# Patient Record
Sex: Female | Born: 1980 | Race: Black or African American | Hispanic: No | Marital: Single | Smoking: Current every day smoker
Health system: Southern US, Community
[De-identification: ages and names within clinical notes are randomized; demographics above are authoritative.]

## PROBLEM LIST (undated history)

## (undated) DIAGNOSIS — G43909 Migraine, unspecified, not intractable, without status migrainosus: Secondary | ICD-10-CM

## (undated) DIAGNOSIS — M329 Systemic lupus erythematosus, unspecified: Secondary | ICD-10-CM

## (undated) DIAGNOSIS — IMO0002 Reserved for concepts with insufficient information to code with codable children: Secondary | ICD-10-CM

## (undated) DIAGNOSIS — M2550 Pain in unspecified joint: Secondary | ICD-10-CM

## (undated) DIAGNOSIS — F419 Anxiety disorder, unspecified: Secondary | ICD-10-CM

## (undated) DIAGNOSIS — E559 Vitamin D deficiency, unspecified: Secondary | ICD-10-CM

## (undated) DIAGNOSIS — M549 Dorsalgia, unspecified: Secondary | ICD-10-CM

## (undated) DIAGNOSIS — L309 Dermatitis, unspecified: Secondary | ICD-10-CM

## (undated) DIAGNOSIS — D649 Anemia, unspecified: Secondary | ICD-10-CM

## (undated) DIAGNOSIS — M1990 Unspecified osteoarthritis, unspecified site: Secondary | ICD-10-CM

## (undated) DIAGNOSIS — Z8739 Personal history of other diseases of the musculoskeletal system and connective tissue: Secondary | ICD-10-CM

## (undated) DIAGNOSIS — I10 Essential (primary) hypertension: Secondary | ICD-10-CM

## (undated) DIAGNOSIS — K219 Gastro-esophageal reflux disease without esophagitis: Secondary | ICD-10-CM

## (undated) DIAGNOSIS — R32 Unspecified urinary incontinence: Secondary | ICD-10-CM

## (undated) DIAGNOSIS — G5600 Carpal tunnel syndrome, unspecified upper limb: Secondary | ICD-10-CM

## (undated) DIAGNOSIS — M797 Fibromyalgia: Secondary | ICD-10-CM

## (undated) DIAGNOSIS — M109 Gout, unspecified: Secondary | ICD-10-CM

## (undated) DIAGNOSIS — E785 Hyperlipidemia, unspecified: Secondary | ICD-10-CM

## (undated) DIAGNOSIS — N3281 Overactive bladder: Secondary | ICD-10-CM

## (undated) DIAGNOSIS — F32A Depression, unspecified: Secondary | ICD-10-CM

## (undated) DIAGNOSIS — J45909 Unspecified asthma, uncomplicated: Secondary | ICD-10-CM

## (undated) DIAGNOSIS — J189 Pneumonia, unspecified organism: Secondary | ICD-10-CM

## (undated) HISTORY — DX: Vitamin D deficiency, unspecified: E55.9

## (undated) HISTORY — DX: Unspecified osteoarthritis, unspecified site: M19.90

## (undated) HISTORY — DX: Reserved for concepts with insufficient information to code with codable children: IMO0002

## (undated) HISTORY — DX: Pain in unspecified joint: M25.50

## (undated) HISTORY — DX: Fibromyalgia: M79.7

## (undated) HISTORY — DX: Dorsalgia, unspecified: M54.9

## (undated) HISTORY — DX: Personal history of other diseases of the musculoskeletal system and connective tissue: Z87.39

## (undated) HISTORY — DX: Overactive bladder: N32.81

## (undated) HISTORY — DX: Gout, unspecified: M10.9

## (undated) HISTORY — DX: Migraine, unspecified, not intractable, without status migrainosus: G43.909

## (undated) HISTORY — DX: Dermatitis, unspecified: L30.9

## (undated) HISTORY — PX: NEUROMA SURGERY: SHX722

## (undated) HISTORY — DX: Hyperlipidemia, unspecified: E78.5

## (undated) HISTORY — PX: LEG SURGERY: SHX1003

## (undated) HISTORY — PX: ANKLE SURGERY: SHX546

## (undated) HISTORY — DX: Systemic lupus erythematosus, unspecified: M32.9

## (undated) HISTORY — DX: Unspecified urinary incontinence: R32

---

## 2014-12-20 ENCOUNTER — Encounter (HOSPITAL_COMMUNITY): Payer: Self-pay | Admitting: *Deleted

## 2014-12-20 ENCOUNTER — Emergency Department (HOSPITAL_COMMUNITY)
Admission: EM | Admit: 2014-12-20 | Discharge: 2014-12-20 | Disposition: A | Discharge status: Home / Self Care | Payer: Medicaid Other | Attending: Emergency Medicine | Admitting: Emergency Medicine

## 2014-12-20 DIAGNOSIS — Z720 Tobacco use: Secondary | ICD-10-CM | POA: Diagnosis not present

## 2014-12-20 DIAGNOSIS — N390 Urinary tract infection, site not specified: Secondary | ICD-10-CM | POA: Insufficient documentation

## 2014-12-20 DIAGNOSIS — Z8701 Personal history of pneumonia (recurrent): Secondary | ICD-10-CM | POA: Insufficient documentation

## 2014-12-20 DIAGNOSIS — M79641 Pain in right hand: Secondary | ICD-10-CM | POA: Diagnosis present

## 2014-12-20 DIAGNOSIS — G5603 Carpal tunnel syndrome, bilateral upper limbs: Secondary | ICD-10-CM | POA: Insufficient documentation

## 2014-12-20 DIAGNOSIS — J45909 Unspecified asthma, uncomplicated: Secondary | ICD-10-CM | POA: Insufficient documentation

## 2014-12-20 DIAGNOSIS — Z880 Allergy status to penicillin: Secondary | ICD-10-CM | POA: Diagnosis not present

## 2014-12-20 DIAGNOSIS — Z3202 Encounter for pregnancy test, result negative: Secondary | ICD-10-CM | POA: Insufficient documentation

## 2014-12-20 HISTORY — DX: Unspecified asthma, uncomplicated: J45.909

## 2014-12-20 HISTORY — DX: Gastro-esophageal reflux disease without esophagitis: K21.9

## 2014-12-20 HISTORY — DX: Carpal tunnel syndrome, unspecified upper limb: G56.00

## 2014-12-20 LAB — URINALYSIS, ROUTINE W REFLEX MICROSCOPIC
BILIRUBIN URINE: NEGATIVE
GLUCOSE, UA: NEGATIVE mg/dL
KETONES UR: NEGATIVE mg/dL
Nitrite: NEGATIVE
PH: 6.5 (ref 5.0–8.0)
Protein, ur: 100 mg/dL — AB
Specific Gravity, Urine: 1.025 (ref 1.005–1.030)
Urobilinogen, UA: 0.2 mg/dL (ref 0.0–1.0)

## 2014-12-20 LAB — POC URINE PREG, ED: Preg Test, Ur: NEGATIVE

## 2014-12-20 LAB — URINE MICROSCOPIC-ADD ON

## 2014-12-20 MED ORDER — IBUPROFEN 800 MG PO TABS: 800.0000 mg | ORAL_TABLET | Freq: Three times a day (TID) | ORAL | Status: DC

## 2014-12-20 MED ORDER — NITROFURANTOIN MONOHYD MACRO 100 MG PO CAPS: 100.0000 mg | ORAL_CAPSULE | Freq: Two times a day (BID) | ORAL | Status: DC

## 2014-12-20 NOTE — ED Notes (Signed)
Patient brought back from triage, refuses to get in gown.

## 2014-12-20 NOTE — ED Notes (Signed)
Pt is here with uti:  Odor, cloudy urine, wipes has some blood tinges, pain with urination.  No vaginal discharge or bleeding.  Does not get menstrual because she gets DEPO.  Pt has carpal tunnel and states she gets hand cramps bilaterally and radiates up arms

## 2014-12-20 NOTE — ED Notes (Signed)
Acuity changed per request of PA in fast trac

## 2014-12-20 NOTE — Discharge Instructions (Signed)
1. Medications: antibiotic, ibuprofen, usual home medications 2. Treatment: rest, drink plenty of fluids  3. Follow Up: please followup with your primary doctor in 1 week for discussion of your diagnoses and further evaluation after today's visit; please follow-up with hand surgeon with no improvement in carpal tunnel symptoms; if you do not have a primary care doctor use the resource guide provided to find one; please return to the ER for fever, abdominal pain, back pain, persistent vomiting, new or worsening symptoms   Carpal Tunnel Syndrome The carpal tunnel is an area under the skin of the palm of your hand. Nerves, blood vessels, and strong tissues (tendons) pass through the tunnel. The tunnel can become puffy (swollen). If this happens, a nerve can be pinched in the wrist. This causes carpal tunnel syndrome.  HOME CARE  Take all medicine as told by your doctor.  If you were given a splint, wear it as told. Wear it at night or at times when your doctor told you to.  Rest your wrist from the activity that causes your pain.  Put ice on your wrist after long periods of wrist activity.  Put ice in a plastic bag.  Place a towel between your skin and the bag.  Leave the ice on for 15-20 minutes, 03-04 times a day.  Keep all doctor visits as told. GET HELP RIGHT AWAY IF:  You have new problems you cannot explain.  Your problems get worse and medicine does not help. MAKE SURE YOU:   Understand these instructions.  Will watch your condition.  Will get help right away if you are not doing well or get worse. Document Released: 02/22/2011 Document Revised: 05/28/2011 Document Reviewed: 02/22/2011 [facility] Patient Information 2015 [place], [place]. This information is not intended to replace advice given to you by your health care provider. Make sure you discuss any questions you have with your health care provider.  Urinary Tract Infection A urinary tract infection (UTI) can occur any  place along the urinary tract. The tract includes the kidneys, ureters, bladder, and urethra. A type of germ called bacteria often causes a UTI. UTIs are often helped with antibiotic medicine.  HOME CARE   If given, take antibiotics as told by your doctor. Finish them even if you start to feel better.  Drink enough fluids to keep your pee (urine) clear or pale yellow.  Avoid tea, drinks with caffeine, and bubbly (carbonated) drinks.  Pee often. Avoid holding your pee in for a long time.  Pee before and after having sex (intercourse).  Wipe from front to back after you poop (bowel movement) if you are a woman. Use each tissue only once. GET HELP RIGHT AWAY IF:   You have back pain.  You have lower belly (abdominal) pain.  You have chills.  You feel sick to your stomach (nauseous).  You throw up (vomit).  Your burning or discomfort with peeing does not go away.  You have a fever.  Your symptoms are not better in 3 days. MAKE SURE YOU:   Understand these instructions.  Will watch your condition.  Will get help right away if you are not doing well or get worse. Document Released: 08/22/2007 Document Revised: 11/28/2011 Document Reviewed: 10/04/2011 [facility] Patient Information 2015 [place], [place]. This information is not intended to replace advice given to you by your health care provider. Make sure you discuss any questions you have with your health care provider.   Emergency Department Resource Guide 1) Find a Doctor  and Pay Out of Pocket Although you won't have to find out who is covered by your insurance plan, it is a good idea to ask around and get recommendations. You will then need to call the office and see if the doctor you have chosen will accept you as a new patient and what types of options they offer for patients who are self-pay. Some doctors offer discounts or will set up payment plans for their patients who do not have insurance, but you will need to ask so  you aren't surprised when you get to your appointment.  2) Contact Your Local Health Department Not all health departments have doctors that can see patients for sick visits, but many do, so it is worth a call to see if yours does. If you don't know where your local health department is, you can check in your phone book. The CDC also has a tool to help you locate your state's health department, and many state websites also have listings of all of their local health departments.  3) Find a Walk-in Clinic If your illness is not likely to be very severe or complicated, you may want to try a walk in clinic. These are popping up all over the country in pharmacies, drugstores, and shopping centers. They're usually staffed by nurse practitioners or physician assistants that have been trained to treat common illnesses and complaints. They're usually fairly quick and inexpensive. However, if you have serious medical issues or chronic medical problems, these are probably not your best option.  No Primary Care Doctor: - Call Health Connect at  [phone] - they can help you locate a primary care doctor that  accepts your insurance, provides certain services, etc. - Physician Referral Service- [phone]  Chronic Pain Problems: Organization         Address  Phone   Notes  [facility]  [phone] Patients need to be referred by their primary care doctor.   Medication Assistance: Organization         Address  Phone   Notes  [facility] Medication [facility] [address] [place] [place], [place] [address] [phone] --Must be a resident of [facility] -- Must have NO insurance coverage whatsoever (no Medicaid/ Medicare, etc.) -- The pt. MUST have a primary care doctor that directs their care regularly and follows them in the community   MedAssist  [phone]   [employer]  [phone]    Agencies that provide inexpensive medical  care: Organization         Address  Phone   Notes  [facility]  [phone]   [facility]    [phone]   [facility] [address] [place], [place] [address] [phone]   [facility] [address] [place], [place] [phone]   Planned Parenthood    [phone]   [facility]    [phone]   [facility]  [address], [place] Phone:  [phone], Fax:  [phone] Hours of Operation:  9 am - 6 pm, M-F.  Also accepts Medicaid/Medicare and self-pay.  [facility]  [address], [place] Phone: [phone], Fax: [phone]. Hours of Operation:  8:30 am - 5:30 pm, M-F.  Also accepts Medicaid and self-pay.  [facility] [place] [address] [employer] Phone: [phone]  [facility] [address], [place], [place] [phone], Ext. [phone] Mondays & Thursdays: 7-9 AM.  First 15 patients are seen on a first come, first serve basis.    Medicaid-accepting [facility] Providers:  Organization         Address  Phone   Notes  [facility] [address], [place] [phone]) [phone] Also accepts self-pay patients.  [facility] [address], [place]  [phone]) [phone]   [facility] [address], [place] [phone]) [phone]   [facility] [address] [place] [address], [place] [phone](336) [phone]   Renaye RakersVeita Bland [address], [place]   [phone]) [phone] Only accepts [place] Access [place] patients after they have their name applied to their card.   Self-Pay (no insurance) in [facility]:  Organization         Address  Phone   Notes  Sickle Cell Patients, [facility] [address], [place] [phone](336) [phone]   [facility] [address],  [place] [phone]) [phone]   [facility]  [address],  [phone]) [phone]   [facility]/Dr. Osei-Bonsu  [address] [place] [address], [place] or [phone], [place] [phone]) [phone] Phone number for both [place] and [place] locations is the same.  [facility] [address], [place] [phone](336) [phone]   [facility] [address] [place] [address], [place] or [address] [phone](336) [phone]   [facility] [address], [place] [phone](336) [phone], phone; [phone](336) [phone], fax Sees patients 1st and 3rd Saturday of every month.  Must not qualify for public or private insurance (i.e. Medicaid, Medicare, [place] Health Choice, Veterans' Benefits)  Household income should be no more than 200% of the poverty level The clinic cannot treat you if you are pregnant or think you are pregnant  Sexually transmitted diseases are not treated at the clinic.    Dental Care: Organization         Address  Phone  Notes  [facility] of [facility] [address], [place] [phone]) [phone] Accepts children up to age 421 who are enrolled in [place]icaid or [place] Health Choice; pregnant women with a Medicaid card; and children who have applied for Medicaid or [place] Health Choice, but were declined, whose parents can pay a reduced fee at time of service.  [facility] of [facility] [place]  [address], [place] [phone]) [phone] Accepts children up to age 36 who are enrolled in [place]icaid or [place] Health Choice; pregnant women with a Medicaid card; and children who have applied for Medicaid or [place] Health Choice, but were declined, whose parents can pay a reduced fee at time of service.  [facility]  [address], [place] [phone]) [phone] Patients are seen by appointment only. Walk-ins are not accepted. [facility] will see patients 36 years of age and older. Monday - Tuesday (8am-5pm) Most Wednesdays (8:30-5pm) $30 per visit, cash only  [facility]  [address], [facility] [phone] Patients are seen by appointment only. Walk-ins are not accepted. [facility] will see patients 218 years of age and older. One Wednesday Evening (Monthly: Volunteer Based).  $30 per visit,  cash only  [employer] of Dentistry Clinics  [phone] for adults; Children under age 75, call Graduate Pediatric Dentistry at [phone]. Children aged 54-14, please call [phone] to request a pediatric application.  Dental services are provided in all areas of dental care including fillings, crowns and bridges, complete and partial dentures, implants, gum treatment, root canals, and extractions. Preventive care is also provided. Treatment is provided to both adults and children. Patients are selected via a lottery and there is often a waiting list.   [facility] [address] [place]  [phone] [email or web address]   [facility] [address] [place], [place] [phone], Ext. [phone] Second and Fourth Thursday of each month, opens at 6:30 AM; Clinic ends at 9 AM.  Patients are seen on a first-come first-served basis, and a limited number are seen during each clinic.   [facility]  [address] Ether [place]ins [place], [place] [phone]   Eligibility Requirements You must have lived in [place], [place], or [place] counties for at least the last three months.   You cannot be eligible for state or federal sponsored [place], including [employer], [place], or [employer].   You generally cannot be eligible for healthcare insurance through your employer.    How to apply: Eligibility screenings are held every Tuesday and Wednesday afternoon from 1:00 pm until 4:00 pm. You do not need an appointment for the interview!   [facility] [address] [place], [place] [phone]   [facility]  [phone]   [facility]  [phone]   [facility] Health Department  [phone]    Behavioral Health Resources in the Community: Intensive Outpatient Programs Organization         Address  Phone  Notes  [facility] [address] [place], [place] [phone]   [facility]patient [address] [place], [place] [phone]   [facility]: [facility] Drug [facility] [address], [place], [place]  [phone]   [facility] [address]  [employer], [place] [phone] or [phone]   Substance Abuse Resources Organization         Address  Phone  Notes  [facility] Drug [facility]  5413450710   [facility]  [phone]   [facility]  [phone]   Floydene Flock  [phone]   Residential & Outpatient Substance Abuse Program  [phone]   Psychological Services Organization         Address  Phone  Notes  [facility]  [phone]   [facility]  [phone]   [facility] Mental Health [address], [place] [phone] or [phone]    Mobile Crisis Teams Organization         Address  Phone  Notes  [facility], Mobile Crisis Care Unit  [phone]   [facility]  [address] [place], [place] [phone]   Doristine Locks [address] [place] [phone]    Self-Help/Support Groups Organization         Address  Phone             Notes  [facility] [place] - variety of support groups  336- I7437963 Call for more information  Narcotics Anonymous (NA), [facility] [address], [employer] [place]  2 meetings at this location   Statistician  Address  Phone  Notes  [facility] [address],    [place]  [phone]   [facility]  [address], [place] [phone], [place], [place] [phone]   [facility] [address], [place] [phone] Admissions: 8am-3pm M-F  [facility] [address]    [place], [place] [phone]   [facility] [address] [place] [address], [place], [place] [phone]   [facility] [address]  [place], [place] [phone]   [facility] - Intensive Outpatient [address], [place], [place] [phone]   [facility] ([facility] [address]  [place], [place] [phone] or [phone]   [facility] (RT[facility]) [address] [place], [place] [phone] Accepts Medicaid  [facility] [address]  [place] [phone] Substance Abuse/Addiction Treatment   [facility] Behavioral Health Resources Organization         Address  Phone  Notes  [facility]  [phone]) [phone]   Angie FavaJulie Brannon, PhD [address], [address] [place], [place]   [phone](336) [phone] or [phone]) [phone]   [facility]   [address] [place], [place] [phone](336) [phone]   [facility] [address], [place], [place] [phone](336) [phone] Insurance/Medicaid/sponsorship through [facility]  Faith and Families [address]                                    [place], [place] [phone]) [phone] Therapy/tele-psych/case  [facility] [address]   [place], [place] [phone]) [phone]    Dr. Lolly MustacheArfeen  [phone](336) [phone]   [facility] [place]  [employer] [facility] 1) [address], [place] 2) [address], [place] 3)  [address], [place] [phone](336) [phone]) [phone]  [phone](336) [phone]   [facility] Child Abuse Hotline [phone](336) 2342889387 or [phone](336) [phone] (After Hours)

## 2014-12-20 NOTE — ED Provider Notes (Signed)
CSN: [id number]     Arrival date & time 12/20/14  1478 History   First MD Initiated Contact with Patient 12/20/14 [phone]     Chief Complaint  Patient presents with  . Urinary Tract Infection  . Hand Pain     HPI   Tracy Banks is a 34 y.o. female with a PMH of carpal tunnel syndrome who presents to the ED with dysuria, urgency, frequency for the last 2 days. She states this is typically how she feels when she has a UTI. She denies fever, chills, chest pain, shortness of breath, back pain, flank pain, abdominal pain, N/V/D/C, vaginal discharge. She reports when she wipes she notices a small amount of blood on the toilet paper. She also complains of bilateral hand pain radiating to her forearms, which she attributes to carpal tunnel syndrome and states this is not new for her. She reports pain and tingling to her 1st through 4th digits, and states her symptoms are constant. She reports her symptoms are exacerbated by movement. She has not tried anything for symptom relief.   Past Medical History  Diagnosis Date  . Carpal tunnel syndrome   . Acid reflux   . Asthma    Past Surgical History  Procedure Laterality Date  . Foot surgery     No family history on file. Social History  Substance Use Topics  . Smoking status: Current Every Day Smoker  . Smokeless tobacco: None  . Alcohol Use: Yes     Comment: occ   OB History    No data available      Review of Systems  Constitutional: Negative for fever and chills.  Respiratory: Negative for shortness of breath.   Cardiovascular: Negative for chest pain.  Gastrointestinal: Negative for nausea, vomiting, abdominal pain, diarrhea, constipation and abdominal distention.  Genitourinary: Positive for dysuria, urgency, frequency and hematuria. Negative for flank pain and vaginal discharge.  Musculoskeletal: Positive for myalgias and arthralgias. Negative for back pain.  Neurological: Negative for weakness and numbness.  All other systems  reviewed and are negative.     Allergies  Penicillins  Home Medications   Prior to Admission medications   Medication Sig Start Date End Date Taking? Authorizing Provider  ibuprofen (ADVIL,MOTRIN) 800 MG tablet Take 1 tablet (800 mg total) by mouth 3 (three) times daily. 12/20/14   Mady Gemma, PA-C  nitrofurantoin, macrocrystal-monohydrate, (MACROBID) 100 MG capsule Take 1 capsule (100 mg total) by mouth 2 (two) times daily. 12/20/14   Dorise Hiss Westfall, PA-C    BP 117/60 mmHg  Pulse 91  Temp(Src) 98.9 F (37.2 C) (Oral)  Resp 18  SpO2 100% Physical Exam  Constitutional: She is oriented to person, place, and time. She appears well-developed and well-nourished. No distress.  HENT:  Head: Normocephalic and atraumatic.  Right Ear: External ear normal.  Left Ear: External ear normal.  Nose: Nose normal.  Mouth/Throat: Uvula is midline, oropharynx is clear and moist and mucous membranes are normal.  Eyes: Conjunctivae, EOM and lids are normal. Pupils are equal, round, and reactive to light. Right eye exhibits no discharge. Left eye exhibits no discharge. No scleral icterus.  Neck: Normal range of motion. Neck supple.  Cardiovascular: Normal rate, regular rhythm, normal heart sounds, intact distal pulses and normal pulses.   Distal pulses intact.  Pulmonary/Chest: Effort normal and breath sounds normal. No respiratory distress.  Abdominal: Soft. Normal appearance and bowel sounds are normal. She exhibits no distension and no mass. There is no tenderness.  There is no rigidity, no rebound and no guarding.  Musculoskeletal: Normal range of motion. She exhibits no edema or tenderness.  Pain with range of motion of bilateral wrists. No significant TTP.  Neurological: She is alert and oriented to person, place, and time. She has normal strength. No sensory deficit.  Strength and sensation intact.  Skin: Skin is warm, dry and intact. No rash noted. She is not diaphoretic. No  erythema. No pallor.  Psychiatric: She has a normal mood and affect. Her speech is normal and behavior is normal. Judgment and thought content normal.  Nursing note and vitals reviewed.   ED Course  Procedures (including critical care time)  Labs Review Labs Reviewed  URINALYSIS, ROUTINE W REFLEX MICROSCOPIC (NOT AT [facility]) - Abnormal; Notable for the following:    APPearance CLOUDY (*)    Hgb urine dipstick LARGE (*)    Protein, ur 100 (*)    Leukocytes, UA SMALL (*)    All other components within normal limits  URINE MICROSCOPIC-ADD ON - Abnormal; Notable for the following:    Squamous Epithelial / LPF FEW (*)    Bacteria, UA FEW (*)    All other components within normal limits  POC URINE PREG, ED    Imaging Review No results found.   I have personally reviewed and evaluated these lab results as part of my medical decision-making.   EKG Interpretation None      MDM   Final diagnoses:  Bilateral carpal tunnel syndrome  UTI (lower urinary tract infection)    34 year old female presents with dysuria, urgency, and frequency for the past 2 days. She also reports hand pain, which she attributes to carpal tunnel syndrome. She denies fever, chills, back pain, flank pain, vaginal discharge. She reports she has noticed blood when she wipes, and that her symptoms are consistent with the way she feels when she has a UTI.  Patient is afebrile. Vital signs stable. Heart RRR. Lungs clear to auscultation bilaterally. Abdomen soft, non-tender, non-distended. No CVA tenderness. Pain with range of motion of wrists bilaterally, no significant TTP. Strength and sensation intact. Distal pulses intact.   Urine pregnancy negative. UA with hemoglobin, small leukocytes. Microscopic with 21-50 white blood cells and 21-50 red blood cells. Hemoglobinuria likely due to UTI. Patient has no fever, back pain, flank pain; low suspicion for pyelonephritis or nephrolithiasis. Will treat with macrobid given  PCN allergy. Patient to wear wrist splints at night. Patient to follow up with PCP and with hand surgeon. Return precautions discussed at length. Patient in agreement with plan.  BP 135/78 mmHg  Pulse 88  Temp(Src) 98.6 F (37 C) (Oral)  Resp 15  SpO2 99%   Mady Gemma, PA-C 12/20/14 2315  Arby Barrette, MD 12/30/14 1323

## 2015-04-21 ENCOUNTER — Ambulatory Visit: Payer: Medicaid Other | Admitting: Certified Nurse Midwife

## 2015-06-23 ENCOUNTER — Encounter: Payer: Self-pay | Admitting: Pediatrics

## 2015-08-31 ENCOUNTER — Encounter: Payer: Self-pay | Admitting: Pediatrics

## 2015-12-27 ENCOUNTER — Ambulatory Visit: Payer: Medicaid Other | Attending: Podiatry | Admitting: Physical Therapy

## 2015-12-27 DIAGNOSIS — R600 Localized edema: Secondary | ICD-10-CM

## 2015-12-27 DIAGNOSIS — M6281 Muscle weakness (generalized): Secondary | ICD-10-CM | POA: Insufficient documentation

## 2015-12-27 DIAGNOSIS — R262 Difficulty in walking, not elsewhere classified: Secondary | ICD-10-CM | POA: Diagnosis present

## 2015-12-27 NOTE — Therapy (Signed)
[facility] [address] [place], [place], [address] Phone: [phone]   Fax:  [phone]  Physical Therapy Evaluation  Patient Details  Name: Tracy Banks MRN: [id number] Date of Birth: 04/09/1980 Referring Provider: Dr. Merwyn Banks   Encounter Date: 12/27/2015      PT End of Session - 12/27/15 1032    Visit Number 1   Number of Visits 16   Date for PT Re-Evaluation 02/21/16   PT Start Time 1025   PT Stop Time 1100   PT Time Calculation (min) 35 min   Activity Tolerance Patient tolerated treatment well   Behavior During Therapy [facility] for tasks assessed/performed      Past Medical History:  Diagnosis Date  . Acid reflux   . Asthma   . Carpal tunnel syndrome     Past Surgical History:  Procedure Laterality Date  . FOOT SURGERY      There were no vitals filed for this visit.       Subjective Assessment - 12/27/15 1027    Subjective Pt underwent Rt. bunionectomy ([place] procedure) without osteotomy Sept.5 and gastrocnemius recession  Patient continues to have pain and tightness.  She walks with a limp, has significant swelling in Rt. LE and has a hard time wearing proper footwear.    Pertinent History Had surgery on L foot as well, but was done with osteotomy in [place]   Limitations Standing;Walking   How long can you stand comfortably? can be up to an hour if still with a shoe    How long can you walk comfortably? 10-15 min   Patient Stated Goals Reduce swelling, walk normal.    Currently in Pain? Yes   Pain Score 5    Pain Location Leg  and ankle    Pain Orientation Lower   Pain Descriptors / Indicators Tightness   Pain Type Surgical pain   Pain Radiating Towards from foot up to medial post LE   Pain Onset More than a month ago   Pain Frequency Constant   Aggravating Factors  weightbearing    Pain Relieving Factors nothing actually    Effect of Pain on Daily Activities pt has 3 children, needs to be  active in their activities            [facility] PT Assessment - 12/27/15 1029      Assessment   Medical Diagnosis Rt. Eliberto Banks bunion   Referring Provider Dr. Merwyn Banks    Onset Date/Surgical Date 11/22/15   Next MD Visit unknown   Prior Therapy No      Precautions   Precautions None     Restrictions   Weight Bearing Restrictions No     Balance Screen   Has the patient fallen in the past 6 months Yes   How many times? 1  with boot on, lost balance while doffing L shoe    Has the patient had a decrease in activity level because of a fear of falling?  Yes   Is the patient reluctant to leave their home because of a fear of falling?  No     Home Environment   Living Environment Private residence   Living Arrangements Children   Type of Home Apartment   Home Access Other (comment)   Home Layout Two level   Alternate Level Stairs-Number of Steps 12   Alternate Level Stairs-Rails Right     Prior Function   Level of Independence Independent     Cognition  Overall Cognitive Status Within Functional Limits for tasks assessed     Observation/Other Assessments   Focus on Therapeutic Outcomes (FOTO)  NT     Observation/Other Assessments-Edema    Edema Circumferential;Figure 8     Figure 8 Edema   Figure 8 - Right  23 1/4 inch    Figure 8 - Left  22 3/8 inch      Sensation   Light Touch Appears Intact     Posture/Postural Control   Posture/Postural Control Postural limitations   Posture Comments wide BOS, knees hyperext      AROM   Right Ankle Dorsiflexion 8   Right Ankle Plantar Flexion 20   Right Ankle Inversion 30   Right Ankle Eversion 40     PROM   Right Ankle Dorsiflexion 10   Right Ankle Inversion 40   Right Ankle Eversion 45     Strength   Right Ankle Dorsiflexion 3+/5   Right Ankle Plantar Flexion 3+/5   Right Ankle Inversion 3/5  pain    Right Ankle Eversion 3+/5     Palpation   Palpation comment edema present throughout Rt. LE, from just below  knee to foot.  Tight and sore medial aspect lower leg.  Scars healed. Skin shiny      Ambulation/Gait   Ambulation Distance (Feet) 300 Feet   Gait Pattern Decreased stance time - right;Decreased dorsiflexion - right;Right genu recurvatum;Antalgic     Pt demonstrated ankle AROM all planes, red theraband for DF, PF, EV and INV x 10-15 reps         PT Education - 12/27/15 1311    Education provided Yes   Education Details POC, HEP AROM and theraband , swelling, MCD visits   Person(s) Educated Patient   Methods Explanation;Demonstration;Verbal cues;Handout   Comprehension Verbalized understanding;Returned demonstration;Verbal cues required;Tactile cues required;Need further instruction             PT Long Term Goals - 12/27/15 1322      PT LONG TERM GOAL #1   Title Pt will be I with HEP for Rt. LE AROM, strength    Baseline given on eval    Time 6   Period Weeks   Status New     PT LONG TERM GOAL #2   Title Pt will be able to walk with less limp (25% better) for short distance in the community.    Baseline walks with limp all the time    Time 6   Period Weeks   Status New     PT LONG TERM GOAL #3   Title Pt will be able to up and down stairs 12 + stairs with min pain increase.     Baseline pain mod to severe   Time 6   Period Weeks   Status New     PT LONG TERM GOAL #4   Title Pt will be able to stand on Rt LE for 15 sec (SLS) to demo improved stability   Baseline unable due to pain    Time 6   Period Weeks   Status New               Plan - 12/27/15 1314    Clinical Impression Statement Patient with low complexity eval of Rt. LE s/p Bunionectomy and Gastrocnemius resection (CPT CPT [phone]).  She has a significant amount of pain, tightness (decr ROm) and swelling in Rt. LE She is concerned her Rt. LE is not healing as the fast  as the L LE did.  She will benefit from PT to allow her to walk with less difficulty, be more active and safe when ambulating in  the community.    Rehab Potential Good   PT Frequency 1x / week   PT Duration 6 weeks  3 visits over 4-6 weeks    PT Treatment/Interventions ADLs/Self Care Home Management;Ultrasound;Cryotherapy;Electrical Stimulation;Functional mobility training;Passive range of motion;Patient/family education;Neuromuscular re-education;Balance training;Manual techniques;Taping;Therapeutic exercise;Therapeutic activities   PT Next Visit Plan check HEP AROM and red T band , manual , stretching , gait    PT Home Exercise Plan AROM and red band    Consulted and Agree with Plan of Care Patient      Patient will benefit from skilled therapeutic intervention in order to improve the following deficits and impairments:  Abnormal gait  Visit Diagnosis: Localized edema  Difficulty in walking, not elsewhere classified  Muscle weakness (generalized)     Problem List There are no active problems to display for this patient.   PAA,JENNIFER 12/27/2015, 1:45 PM  [facility] [address] [place], [place], [address] Phone: [phone]   Fax:  [phone]  Name: Tanara Turvey MRN: [id number] Date of Birth: 1980/05/03  Karie Mainland, PT 12/27/15 3:24 PM Phone: [phone] Fax: [phone]

## 2015-12-27 NOTE — Patient Instructions (Signed)
Inversion: Resisted   Cross legs with right leg underneath, foot in tubing loop. Hold tubing around other foot to resist and turn foot in. Repeat __10-20 times per set. Do __1__ sets per session. Do ___2_ sessions per day.  http://orth.exer.us/12   Copyright  VHI. All rights reserved.  Eversion: Resisted   With right foot in tubing loop, hold tubing around other foot to resist and turn foot out. Repeat __10-20__ times per set. Do __1__ sets per session. Do __2__ sessions per day.  http://orth.exer.us/14   Copyright  VHI. All rights reserved.  Plantar Flexion: Resisted   Anchor behind, tubing around left foot, press down. Repeat _10-20___ times per set. Do __1__ sets per session. Do _2_ sessions per day.  http://orth.exer.us/10   Copyright  VHI. All rights reserved.  Dorsiflexion: Resisted   Facing anchor, tubing around left foot, pull toward face.  Repeat _10-20__ times per set. Do _1___ sets per session. Do __2__ sessions per day.  http://orth.exer.us/8   Copyright  VHI. All rights reserved.   ROM: Inversion / Eversion   With left leg relaxed, gently turn ankle and foot in and out. Move through full range of motion. Avoid pain. Repeat ___10_ times per set. Do __1-2__ sets per session. Do __2__ sessions per day.  http://orth.exer.us/36   Copyright  VHI. All rights reserved.  ROM: Plantar / Dorsiflexion   With left leg relaxed, gently flex and extend ankle. Move through full range of motion. Avoid pain. Repeat __10__ times per set. Do _1-2___ sets per session. Do __2_ sessions per day.  http://orth.exer.us/34   Copyright  VHI. All rights reserved.  Ankle Alphabet   Using left ankle and foot only, trace the letters of the alphabet. Perform A to Z. Repeat ___1_ times per set. Do __1__ sets per session. Do ___1_ sessions per day.  http://orth.exer.us/16   Copyright  VHI. All rights reserved.  Ankle Circles   Slowly rotate right foot and ankle  clockwise then counterclockwise. Gradually increase range of motion. Avoid pain. Circle __10__ times each direction per set. Do __1__ sets per session. Do ___2_ sessions per day.  http://orth.exer.us/30   Copyright  VHI. All rights reserved.

## 2016-01-10 ENCOUNTER — Ambulatory Visit: Payer: Medicaid Other | Admitting: Physical Therapy

## 2016-01-10 DIAGNOSIS — R600 Localized edema: Secondary | ICD-10-CM

## 2016-01-10 DIAGNOSIS — M6281 Muscle weakness (generalized): Secondary | ICD-10-CM

## 2016-01-10 DIAGNOSIS — R262 Difficulty in walking, not elsewhere classified: Secondary | ICD-10-CM

## 2016-01-10 NOTE — Patient Instructions (Signed)
Gastroc / Heel Cord Stretch - Seated With Towel    Sit on floor, towel around ball of foot. Gently pull foot in toward body, stretching heel cord and calf. Hold for __30_ seconds. Repeat on involved leg. Repeat __3_ times. Do _2__ times per day.  Copyright  VHI. All rights reserved.  Towel Curl    Sitting, crimp a towel up with toes of one foot. Relax foot by spreading towel out again. Repeat with other foot. Repeat __10__ times. Do _2___ sessions per day.  http://gt2.exer.us/412   Copyright  VHI. All rights reserved.  Passively stretch big toe back and down, circle ankle with your hand, massage your foot, etc...Marland Kitchen

## 2016-01-10 NOTE — Therapy (Signed)
[facility] [address] [place], [place], [address] Phone: [phone]   Fax:  [phone]  Physical Therapy Treatment  Patient Details  Name: Tracy ManilaKiesha Cassaro MRN: [id number] Date of Birth: 03-02-1981 Referring Provider: Dr. Merwyn KatosJohn Petery   Encounter Date: 01/10/2016      PT End of Session - 01/10/16 1114    Visit Number 2   Number of Visits 4   Date for PT Re-Evaluation 02/10/16   PT Start Time 1020   PT Stop Time 1107   PT Time Calculation (min) 47 min   Activity Tolerance Patient tolerated treatment well   Behavior [facility] Therapy [facility] for tasks assessed/performed      Past Medical History:  Diagnosis Date  . Acid reflux   . Asthma   . Carpal tunnel syndrome     Past Surgical History:  Procedure Laterality Date  . FOOT SURGERY      There were no vitals filed for this visit.      Subjective Assessment - 01/10/16 1026    Subjective Rt. leg is about a 6/10.  Still in alot of pain.  Working on doing the exercises everyday.     Currently in Pain? Yes   Pain Score 6    Pain Location Leg   Pain Orientation Right   Pain Type Surgical pain;Chronic pain   Pain Onset More than a month ago   Pain Frequency Constant   Aggravating Factors  activity, moving ankle and toes    Pain Relieving Factors nothing              [facility] Adult PT Treatment/Exercise - 01/10/16 1028      Self-Care   Self-Care Scar Mobilizations;Heat/Ice Application;Other Self-Care Comments   Scar Mobilizations cocoa butter, PROM toes    Heat/Ice Application ice for swelling  and pain    Other Self-Care Comments  arches,HEP      Manual Therapy   Manual Therapy Soft tissue mobilization;Myofascial release   Soft tissue mobilization post medial calf, scar tissue massage, post tib, toe extensors with PROM    Myofascial Release Rt. LE      Ankle Exercises: Supine   T-Band Red band all planes x 10    Other Supine Ankle Exercises AROM DF/PF, EV,  INV and circles    Other Supine Ankle Exercises toe scrunch      Ankle Exercises: Stretches   Plantar Fascia Stretch 3 reps;30 seconds   Plantar Fascia Stretch Limitations TOWEL      Ankle Exercises: Aerobic   Stationary Bike NuStep LE only for 8 min small ROM for continuous muscle activation.      Ankle Exercises: Seated   Towel Crunch 5 reps   Heel Raises 10 reps   Heel Raises Limitations toe raise x 10                 PT Education - 01/10/16 1102    Education provided Yes   Education Details HEP , passive motion of toes, towel   Person(s) Educated Patient   Methods Explanation;Handout   Comprehension Verbalized understanding;Returned demonstration;Need further instruction             PT Long Term Goals - 01/10/16 1052      PT LONG TERM GOAL #1   Title Pt will be I with HEP for Rt. LE AROM, strength    Status On-going     PT LONG TERM GOAL #2   Title Pt will be able to walk  with less limp (25% better) for short distance in the community.    Status On-going     PT LONG TERM GOAL #3   Title Pt will be able to up and down stairs 12 + stairs with min pain increase.     Status On-going     PT LONG TERM GOAL #4   Title Pt will be able to stand on Rt LE for 15 sec (SLS) to demo improved stability   Status On-going               Plan - 01/10/16 1115    Clinical Impression Statement Patient continues to have significant pain.  She has good AROM in ankle, lacks Gr Toe ext and flexion.  Addressed medial calf and toe extensor compartment with soft tissue work.  Patient has completely flattened arch.  Sees MD next week.     Rehab Potential Good   PT Next Visit Plan assess today's treatment (soft tissue), advance to seated towel ex, standing as able, manual    PT Home Exercise Plan AROM and red band all planes, towel scrunch and calf stretch with towel    Consulted and Agree with Plan of Care Patient      Patient will benefit from skilled therapeutic  intervention in order to improve the following deficits and impairments:  Abnormal gait, Decreased range of motion, Increased fascial restricitons, Obesity, Decreased activity tolerance, Pain, Decreased scar mobility, Impaired flexibility, Postural dysfunction, Decreased strength, Decreased mobility, Increased edema, Decreased endurance  Visit Diagnosis: Localized edema  Difficulty in walking, not elsewhere classified  Muscle weakness (generalized)     Problem List There are no active problems to display for this patient.   Tracy Banks 01/10/2016, 11:21 AM  [facility] [address] [place], [place], [address] Phone: [phone]   Fax:  [phone]  Name: Tracy Banks MRN: [id number] Date of Birth: 07/01/1980   Karie Mainland, PT 01/10/16 11:22 AM Phone: [phone] Fax: [phone]

## 2016-01-24 ENCOUNTER — Ambulatory Visit: Payer: Medicaid Other | Attending: Podiatry | Admitting: Physical Therapy

## 2016-01-24 DIAGNOSIS — R262 Difficulty in walking, not elsewhere classified: Secondary | ICD-10-CM | POA: Diagnosis present

## 2016-01-24 DIAGNOSIS — R600 Localized edema: Secondary | ICD-10-CM | POA: Diagnosis present

## 2016-01-24 DIAGNOSIS — M6281 Muscle weakness (generalized): Secondary | ICD-10-CM

## 2016-01-24 NOTE — Therapy (Signed)
[facility] [address] [place], [place], [address] Phone: [phone]   Fax:  [phone]  Physical Therapy Treatment  Patient Details  Name: Tracy Banks MRN: [id number] Date of Birth: [birth year] Referring Provider: Dr. Merwyn KatosJohn Petery   Encounter Date: 01/24/2016      PT End of Session - 01/24/16 1030    Visit Number 3   Number of Visits 5   Date for PT Re-Evaluation 02/10/16   Authorization Type MCD   Authorization Time Period 10/16 to 11/24    Authorization - Number of Visits 3   PT Start Time 1018   PT Stop Time 1118   PT Time Calculation (min) 60 min   Activity Tolerance Patient limited by pain   Behavior [facility] for tasks assessed/performed      Past Medical History:  Diagnosis Date  . Acid reflux   . Asthma   . Carpal tunnel syndrome     Past Surgical History:  Procedure Laterality Date  . FOOT SURGERY      There were no vitals filed for this visit.      Subjective Assessment - 01/24/16 1025    Subjective In pain.  Was on my feet all day yesterday.  Hurts from the knee down.  I'm getting custom orthotics Mon [facility] [employer]    Currently in Pain? Yes   Pain Score 6    Pain Location Leg   Pain Orientation Right;Lower   Pain Descriptors / Indicators Tightness   Pain Type Chronic pain;Surgical pain   Pain Onset More than a month ago   Pain Frequency Constant   Aggravating Factors  standing , moving it    Pain Relieving Factors nothing yet.                          [facility] Treatment/Exercise - 01/24/16 1029      Electrical Stimulation   Electrical Stimulation Location Rt. LE    Electrical Stimulation Action IFC then pre mod   Electrical Stimulation Parameters unable to feel lower set of electodes  did not complete    Electrical Stimulation Goals Edema;Pain     Ankle Exercises: Aerobic   Stationary Bike NuStep LE only for 8 min small ROM for continuous muscle  activation.      Ankle Exercises: Supine   T-Band Red band all planes x 10    Other Supine Ankle Exercises AROM DF/PF, EV, INV and circles    Other Supine Ankle Exercises toe scrunch      Ankle Exercises: Stretches   Plantar Fascia Stretch 3 reps;30 seconds   Plantar Fascia Stretch Limitations TOWEL    Soleus Stretch 2 reps;30 seconds   Gastroc Stretch 2 reps;30 seconds     Ankle Exercises: Seated   Towel Crunch Other (comment)   Towel Inversion/Eversion 2 reps;Other (comment)  length of towel x 1 EACH, increased time due to pain    Heel Raises 10 reps   Heel Raises Limitations toe raise x 10    Toe Raise 10 reps  and toe ext  x10      Ankle Exercises: Standing   Rocker Board 2 minutes                PT Education - 01/24/16 1054    Education provided Yes   Education Details standing calf stretch , towel    Person(s) Educated Patient   Methods Explanation;Demonstration;Verbal cues;Handout   Comprehension  Verbalized understanding;Returned demonstration             PT Long Term Goals - 01/24/16 1208      PT LONG TERM GOAL #1   Title Pt will be I with HEP for Rt. LE AROM, strength    Status On-going     PT LONG TERM GOAL #2   Title Pt will be able to walk with less limp (25% better) for short distance in the community.    Status On-going     PT LONG TERM GOAL #3   Title Pt will be able to up and down stairs 12 + stairs with min pain increase.     Status On-going     PT LONG TERM GOAL #4   Title Pt will be able to stand on Rt LE for 15 sec (SLS) to demo improved stability   Status On-going               Plan - 01/24/16 1203    Clinical Impression Statement Pt worked on progression to standing stretches, more functional activities.  Reluctant to perform PF and toe ext in weightbearing.  massage last time made pain worse.  Trial of IFC revealed decreased sensation to lower leg, ankle.     PT Next Visit Plan progressed standing, functional  activities Try Vaso for edema, tape? GOALS update    PT Home Exercise Plan AROM and green band all planes, towel scrunch and calf stretch with towel    Consulted and Agree with Plan of Care Patient      Patient will benefit from skilled therapeutic intervention in order to improve the following deficits and impairments:  Abnormal gait, Decreased range of motion, Increased fascial restricitons, Obesity, Decreased activity tolerance, Pain, Decreased scar mobility, Impaired flexibility, Postural dysfunction, Decreased strength, Decreased mobility, Increased edema, Decreased endurance  Visit Diagnosis: Localized edema  Difficulty in walking, not elsewhere classified  Muscle weakness (generalized)     Problem List There are no active problems to display for this patient.   Sarahmarie Leavey 01/24/2016, 12:12 PM  [facility] [address] [place], [place], [address] Phone: [phone]   Fax:  [phone]  Name: Tracy Banks MRN: [id number] Date of Birth: 1980-08-07   Karie MainlandJennifer Bartley Vuolo, PT 01/24/16 12:12 PM Phone: [phone] Fax: [phone]

## 2016-01-24 NOTE — Patient Instructions (Signed)
Achilles / Gastroc, Standing    Stand, right foot behind, heel on floor and turned slightly out, leg straight, forward leg bent. Move hips forward. Hold _30__ seconds. Repeat __3_ times per session. Do __2_ sessions per day.  Copyright  VHI. All rights reserved.  Then try the same thing but slightly bend your knee. , repeat x 3, 30 sec

## 2016-01-31 ENCOUNTER — Ambulatory Visit: Payer: Medicaid Other | Admitting: Physical Therapy

## 2016-01-31 DIAGNOSIS — M6281 Muscle weakness (generalized): Secondary | ICD-10-CM

## 2016-01-31 DIAGNOSIS — R600 Localized edema: Secondary | ICD-10-CM

## 2016-01-31 DIAGNOSIS — R262 Difficulty in walking, not elsewhere classified: Secondary | ICD-10-CM

## 2016-01-31 NOTE — Therapy (Signed)
[place], [place], [address] Phone: [phone]   Fax:  [phone]  Physical Therapy Treatment, Discharge   Patient Details  Name: Tracy Banks MRN: [id number] Date of Birth: July 07, 1980 Referring Provider: Dr. Inocencio Homes   Encounter Date: 01/31/2016      PT End of Session - 01/31/16 1043    Visit Number 4   Number of Visits 5   Date for PT Re-Evaluation 02/10/16   Authorization Type MCD   Authorization Time Period 10/16 to 11/24    PT Start Time 1020   PT Stop Time 1114   PT Time Calculation (min) 54 min   Activity Tolerance Patient tolerated treatment well   Behavior During Therapy [facility] for tasks assessed/performed      Past Medical History:  Diagnosis Date  . Acid reflux   . Asthma   . Carpal tunnel syndrome     Past Surgical History:  Procedure Laterality Date  . FOOT SURGERY      There were no vitals filed for this visit.      Subjective Assessment - 01/31/16 1021    Subjective Saw Dr. Barkley Bruns yesterday started new meds, wants me to see a Dietician.  I cant get my orthotics because [facility] won't pay for them they are $300.    Currently in Pain? Yes   Pain Score 8    Pain Location Leg  Bilat big toes    Pain Orientation Right;Lower   Pain Descriptors / Indicators Tightness   Pain Type Chronic pain;Surgical pain   Pain Onset More than a month ago   Pain Frequency Constant            [facility] PT Assessment - 01/31/16 1059      Strength   Right Ankle Dorsiflexion 5/5   Right Ankle Plantar Flexion 4/5   Right Ankle Inversion 3+/5   Right Ankle Eversion 3+/5            [facility] Adult PT Treatment/Exercise - 01/31/16 1053      Self-Care   Heat/Ice Application heat for muscle tightness    Other Self-Care Comments  [employer], off the shelf brace, orthotic     Ankle Exercises: Supine   T-Band green band all planes x 10    Other Supine Ankle Exercises AROM DF,  EV, INV and  circles    Other Supine Ankle Exercises toe scrunch      Ankle Exercises: Stretches   Plantar Fascia Stretch 3 reps;30 seconds   Plantar Fascia Stretch Limitations TOWEL    Soleus Stretch 2 reps;30 seconds   Gastroc Stretch 2 reps;30 seconds     Ankle Exercises: Standing   SLS 12 sec , several trials    Heel Raises 10 reps     Ankle Exercises: Seated   Towel Inversion/Eversion 2 reps;Other (comment)  length of towel x 1 EACH, increased time due to pain     Self care: aquatic for weight loss, weight in role of foot pain and fallen arches             PT Education - 01/31/16 1042    Education provided Yes   Education Details full HEP review, options for pain relief   Person(s) Educated Patient   Methods Explanation;Demonstration   Comprehension Verbalized understanding;Returned demonstration             PT Long Term Goals - 01/31/16 1044      PT LONG TERM GOAL #1  Title Pt will be I with HEP for Rt. LE AROM, strength    Status Achieved     PT LONG TERM GOAL #2   Title Pt will be able to walk with less limp (25% better) for short distance in the community.    Status Not Met     PT LONG TERM GOAL #3   Title Pt will be able to up and down stairs 12 + stairs with min pain increase.     Status Not Met     PT LONG TERM GOAL #4   Title Pt will be able to stand on Rt LE for 15 sec (SLS) to demo improved stability   Status Achieved               Plan - 01/31/16 1207    Clinical Impression Statement Patient frustrated with her MD, she feels her pain is not due to her weight as she has "always been heavy". We discussed options for minimizing pain durgin exercise, walking for weight loss vs being "up on her feet."  I recommended she shorten the duration and increase the intensity for weight loss and to also lessen the pain in her toe.  She has not really made any progress with PT so we decided to DC today. She has a ful HEP and also an application for financial  assist for her and her family to improve health.    PT Next Visit Plan NA, DC    PT Home Exercise Plan AROM and green band all planes, towel scrunch and calf stretch with towel    Consulted and Agree with Plan of Care Patient      Patient will benefit from skilled therapeutic intervention in order to improve the following deficits and impairments:  Abnormal gait, Decreased range of motion, Increased fascial restricitons, Obesity, Decreased activity tolerance, Pain, Decreased scar mobility, Impaired flexibility, Postural dysfunction, Decreased strength, Decreased mobility, Increased edema, Decreased endurance  Visit Diagnosis: Localized edema  Difficulty in walking, not elsewhere classified  Muscle weakness (generalized)     Problem List There are no active problems to display for this patient.   PAA,JENNIFER 01/31/2016, 1:29 PM  [facility] [address] [place], [place], [address] Phone: [phone]   Fax:  [phone]  Name: Tracy Banks MRN: [id number] Date of Birth: 1980/12/30   Raeford Razor, PT 01/31/16 1:30 PM Phone: [phone] Fax: [phone]    PHYSICAL THERAPY DISCHARGE SUMMARY  Visits from Start of Care: 4  Current functional level related to goals / functional outcomes: See above    Remaining deficits: Pain, LE alignment, swelling, muscle strength and activity intolerance   Education / Equipment: Self care, HEP, ICE vs heat  Plan: Patient agrees to discharge.  Patient goals were not met. Patient is being discharged due to financial reasons.  ?????   And lack of progress   Raeford Razor, PT 01/31/16 1:31 PM Phone: [phone] Fax: [phone]

## 2016-02-14 ENCOUNTER — Encounter: Payer: Medicaid Other | Admitting: Physical Therapy

## 2016-04-06 ENCOUNTER — Ambulatory Visit: Payer: Medicaid Other | Admitting: Skilled Nursing Facility1

## 2016-04-16 ENCOUNTER — Encounter: Payer: Medicaid Other | Attending: Podiatry | Admitting: Skilled Nursing Facility1

## 2016-04-16 ENCOUNTER — Encounter: Payer: Self-pay | Admitting: Skilled Nursing Facility1

## 2016-04-16 DIAGNOSIS — E739 Lactose intolerance, unspecified: Secondary | ICD-10-CM | POA: Insufficient documentation

## 2016-04-16 DIAGNOSIS — E669 Obesity, unspecified: Secondary | ICD-10-CM | POA: Insufficient documentation

## 2016-04-16 DIAGNOSIS — Z713 Dietary counseling and surveillance: Secondary | ICD-10-CM | POA: Diagnosis not present

## 2016-04-16 DIAGNOSIS — Z6841 Body Mass Index (BMI) 40.0 and over, adult: Secondary | ICD-10-CM | POA: Diagnosis not present

## 2016-04-16 DIAGNOSIS — E6609 Other obesity due to excess calories: Secondary | ICD-10-CM

## 2016-04-16 NOTE — Patient Instructions (Addendum)
-  Eat 3 meals a day and snacks in between (if you are hungry for the snacks) -A meal: carbohydrate, protein, vegetable -A snack: A Fruit OR Vegetable AND Protein -Honor your body by listening to your hunger and fullness cues -First thought: Am I hungry? -Second thought: (if the answer is yes I am hungry) Does this meal have vegetables? Protein? Carbohydrate?  -Third thought: Are there more vegetables on my plate compared to Protein and Carbohydrates? -After you have finished your first serving Do Not go back for more until you have waited 20-30 minutes and checked in with your body by asking Am I Still Hungry?   -Try your smoothie without the ice cream -Fruit cup packed in its own juice or water -100% fruit juice only  -Try to go for a 30 minute walk if you can tolerate 3 times a week

## 2016-04-16 NOTE — Progress Notes (Signed)
  Medical Nutrition Therapy:  Appt start time: 8:45 end time:  9:45   Assessment:  Primary concerns today: obesity. Pt arrives with body language sending the message she does not want to be here. Pt states her foot doctor referred her not even her PCP (implying its not that serious then). Pt states seh hasd been a big girl her whole life. Pt states her son is 300 pounds and 38 years old. Pt states she is lactose intolerant. Pt states she adds a Scoop of ice cream in her smoothie. Pt states she has a bowel movement everyday sometimes 3 times a day.  Pts responses to the education: My ankles were already hurting from a previous injury that pain has nothing to do with my weight and I was already eating like that (referring to eating 3 meals a day with non-starchy vegetables, complex carbohydrates, and lean protein).  Preferred Learning Style:   No preference indicated   Learning Readiness:   Not ready  MEDICATIONS: See List   DIETARY INTAKE:  Usual eating pattern includes 1 meals and 4 snacks per day.  Everyday foods include none stated.  Avoided foods include none stated.    24-hr recall:  B ( AM): none Snk ( AM):  none L ( PM): fruit cup Snk ( PM): almonds  D ( PM): fried chicken, corn bread, corn on the cob Snk ( PM): none Beverages:  Water, soda, juice  Usual physical activity:  ADL's  Estimated energy needs: 1500 calories 170 g carbohydrates 112 g protein 42 g fat  Progress Towards Goal(s):  In progress.   Nutritional Diagnosis:  [place] -3.3 Overweight/obesity As related to overconsumption.  As evidenced by pt report, 24 hr recall, physician referral, BMI 53.46    Intervention:  Nutrition counseling for obesity. Dietitian educated the pt on the research indicated consequences of increased adipose tissue. Dietitian educated the pt on balanced meals and meal frequency.  Goals: -Eat 3 meals a day and snacks in between (if you are hungry for the snacks) -A meal: carbohydrate,  protein, vegetable -A snack: A Fruit OR Vegetable AND Protein -Honor your body by listening to your hunger and fullness cues -First thought: Am I hungry? -Second thought: (if the answer is yes I am hungry) Does this meal have vegetables? Protein? Carbohydrate?  -Third thought: Are there more vegetables on my plate compared to Protein and Carbohydrates? -After you have finished your first serving Do Not go back for more until you have waited 20-30 minutes and checked in with your body by asking Am I Still Hungry?   -Try your smoothie without the ice cream -Fruit cup packed in its own juice or water -100% fruit juice only  -Try to go for a 30 minute walk if you can tolerate 3 times a week  Teaching Method Utilized:  Visual Auditory Hands on  Handouts given during visit include:  MyPlate  Low sodium seasoning options  Should I eat  Demonstrated degree of understanding via:  Teach Back   Monitoring/Evaluation:  Dietary intake, exercise, and body weight prn.

## 2016-11-05 DIAGNOSIS — M797 Fibromyalgia: Secondary | ICD-10-CM | POA: Insufficient documentation

## 2017-12-02 ENCOUNTER — Emergency Department (HOSPITAL_COMMUNITY): Payer: Medicaid Other

## 2017-12-02 ENCOUNTER — Encounter (HOSPITAL_COMMUNITY): Payer: Self-pay | Admitting: *Deleted

## 2017-12-02 ENCOUNTER — Other Ambulatory Visit: Payer: Self-pay

## 2017-12-02 ENCOUNTER — Emergency Department (HOSPITAL_COMMUNITY)
Admission: EM | Admit: 2017-12-02 | Discharge: 2017-12-02 | Disposition: A | Discharge status: Home / Self Care | Payer: Medicaid Other | Attending: Emergency Medicine | Admitting: Emergency Medicine

## 2017-12-02 DIAGNOSIS — J45909 Unspecified asthma, uncomplicated: Secondary | ICD-10-CM | POA: Insufficient documentation

## 2017-12-02 DIAGNOSIS — Y69 Unspecified misadventure during surgical and medical care: Secondary | ICD-10-CM | POA: Insufficient documentation

## 2017-12-02 DIAGNOSIS — G8918 Other acute postprocedural pain: Secondary | ICD-10-CM | POA: Insufficient documentation

## 2017-12-02 DIAGNOSIS — F17200 Nicotine dependence, unspecified, uncomplicated: Secondary | ICD-10-CM | POA: Diagnosis not present

## 2017-12-02 DIAGNOSIS — Z79899 Other long term (current) drug therapy: Secondary | ICD-10-CM | POA: Diagnosis not present

## 2017-12-02 DIAGNOSIS — T8131XA Disruption of external operation (surgical) wound, not elsewhere classified, initial encounter: Secondary | ICD-10-CM | POA: Diagnosis not present

## 2017-12-02 DIAGNOSIS — T8130XA Disruption of wound, unspecified, initial encounter: Secondary | ICD-10-CM

## 2017-12-02 DIAGNOSIS — R52 Pain, unspecified: Secondary | ICD-10-CM

## 2017-12-02 LAB — I-STAT BETA HCG BLOOD, ED (MC, WL, AP ONLY): I-stat hCG, quantitative: <5 m[IU]/mL (ref <5)

## 2017-12-02 LAB — COMPREHENSIVE METABOLIC PANEL
ALT: 28 U/L (ref 0–44)
AST: 30 U/L (ref 15–41)
Albumin: 3.3 g/dL — ABNORMAL LOW (ref 3.5–5.0)
Alkaline Phosphatase: 68 U/L (ref 38–126)
Anion gap: 10 (ref 5–15)
BILIRUBIN TOTAL: 0.6 mg/dL (ref 0.3–1.2)
BUN: 15 mg/dL (ref 6–20)
CO2: 20 mmol/L — ABNORMAL LOW (ref 22–32)
CREATININE: 0.84 mg/dL (ref 0.44–1.00)
Calcium: 9 mg/dL (ref 8.9–10.3)
Chloride: 112 mmol/L — ABNORMAL HIGH (ref 98–111)
GLUCOSE: 97 mg/dL (ref 70–99)
Potassium: 4.4 mmol/L (ref 3.5–5.1)
Sodium: 142 mmol/L (ref 135–145)
Total Protein: 6.8 g/dL (ref 6.5–8.1)

## 2017-12-02 LAB — CBC WITH DIFFERENTIAL/PLATELET
ABS IMMATURE GRANULOCYTES: 0 10*3/uL (ref 0.0–0.1)
Basophils Absolute: 0 10*3/uL (ref 0.0–0.1)
Basophils Relative: 1 %
EOS ABS: 0.1 10*3/uL (ref 0.0–0.7)
Eosinophils Relative: 1 %
HEMATOCRIT: 39.8 % (ref 36.0–46.0)
Hemoglobin: 12.4 g/dL (ref 12.0–15.0)
IMMATURE GRANULOCYTES: 0 %
LYMPHS ABS: 3.8 10*3/uL (ref 0.7–4.0)
Lymphocytes Relative: 44 %
MCH: 27.9 pg (ref 26.0–34.0)
MCHC: 31.2 g/dL (ref 30.0–36.0)
MCV: 89.4 fL (ref 78.0–100.0)
MONO ABS: 0.5 10*3/uL (ref 0.1–1.0)
MONOS PCT: 6 %
NEUTROS PCT: 48 %
Neutro Abs: 4.2 10*3/uL (ref 1.7–7.7)
Platelets: 228 10*3/uL (ref 150–400)
RBC: 4.45 MIL/uL (ref 3.87–5.11)
RDW: 13.9 % (ref 11.5–15.5)
WBC: 8.6 10*3/uL (ref 4.0–10.5)

## 2017-12-02 LAB — I-STAT CG4 LACTIC ACID, ED
LACTIC ACID, VENOUS: 2.01 mmol/L — AB (ref 0.5–1.9)
LACTIC ACID, VENOUS: 2.29 mmol/L — AB (ref 0.5–1.9)
Lactic Acid, Venous: 0.75 mmol/L (ref 0.5–1.9)

## 2017-12-02 MED ORDER — HYDROMORPHONE HCL 1 MG/ML IJ SOLN
1.0000 mg | Freq: Once | INTRAMUSCULAR | Status: AC
  Administered 2017-12-02: 1 mg via INTRAVENOUS
  Filled 2017-12-02: qty 1

## 2017-12-02 MED ORDER — IBUPROFEN 400 MG PO TABS: 400.0000 mg | ORAL_TABLET | Freq: Once | ORAL | Status: DC | PRN

## 2017-12-02 MED ORDER — SODIUM CHLORIDE 0.9 % IV BOLUS
1000.0000 mL | Freq: Once | INTRAVENOUS | Status: AC
  Administered 2017-12-02: 1000 mL via INTRAVENOUS

## 2017-12-02 MED ORDER — HYDROCODONE-ACETAMINOPHEN 5-325 MG PO TABS
2.0000 | ORAL_TABLET | Freq: Once | ORAL | Status: AC
  Administered 2017-12-02: 2 via ORAL
  Filled 2017-12-02: qty 2

## 2017-12-02 NOTE — ED Triage Notes (Signed)
Pt in c/o increased pain to her right foot this morning, pt had surgery a week ago on her foot, states her stitches broke three days after surgery and were repaired, broke again last night, pt is out of pain medication and pain is worse again this morning

## 2017-12-02 NOTE — ED Notes (Signed)
Critical lab results communicate to RN Huntley DecSara @0904 .

## 2017-12-02 NOTE — Discharge Instructions (Addendum)
You were evaluated in the emergency department for worsening right foot pain.  You had blood work and pain medications with improvement in your symptoms.  Your podiatrist wants to see you today in the office so we are discharging you to go to his office today.

## 2017-12-02 NOTE — ED Triage Notes (Signed)
Top of foot noted to be red, pt will not answer questions about drainage or how long her foot has been red

## 2017-12-02 NOTE — ED Provider Notes (Signed)
[facility] Provider Note   CSN: [id number] Arrival date & time: 12/02/17  [id number]     History   Chief Complaint Chief Complaint  Patient presents with  . Post-op Problem    HPI Tracy Banks is a 37 y.o. female.  She had surgery to her right foot about a week ago by Dr. Grandville SilosPetrie.  She says about 3 days later her stitches popped and she had to go back to him and he replaced stitches.  She finished her Vicodin on Saturday.  She says yesterday she unwrapped it because she thought the stitches are popped and they had and she wrapped it up.  She said the dressings have had some drainage on them.  At about 3 AM she was awoken with severe foot pain and is been unrelenting since then.  She rates it as exquisite and sharp and worse with any kind of movement of her foot.  It is on the top of her foot where incision is and also on the ball of her foot.  She has tried nothing for it and she was going to go to her doctor today but the pain was too much and so she presented to the emergency department.  The history is provided by the patient.  Foot Pain  This is a new problem. The current episode started 6 to 12 hours ago. The problem occurs constantly. The problem has not changed since onset.Pertinent negatives include no chest pain, no abdominal pain, no headaches and no shortness of breath. The symptoms are aggravated by bending, twisting and standing. Nothing relieves the symptoms. She has tried nothing for the symptoms. The treatment provided no relief.    Past Medical History:  Diagnosis Date  . Acid reflux   . Asthma   . Carpal tunnel syndrome     There are no active problems to display for this patient.   Past Surgical History:  Procedure Laterality Date  . FOOT SURGERY       OB History   None      Home Medications    Prior to Admission medications   Medication Sig Start Date End Date Taking? Authorizing Provider  fluticasone (FLOVENT  HFA) 110 MCG/ACT inhaler Inhale 2 puffs into the lungs 2 (two) times daily.    [provider]  ibuprofen (ADVIL,MOTRIN) 600 MG tablet Take 600 mg by mouth every 6 (six) hours as needed for moderate pain.    [provider]  ibuprofen (ADVIL,MOTRIN) 800 MG tablet Take 1 tablet (800 mg total) by mouth 3 (three) times daily. Patient not taking: Reported on 12/27/2015 12/20/14   Tracy Banks, Elizabeth C, PA-C  meloxicam (MOBIC) 7.5 MG tablet Take 7.5 mg by mouth daily.    [provider]  nitrofurantoin, macrocrystal-monohydrate, (MACROBID) 100 MG capsule Take 1 capsule (100 mg total) by mouth 2 (two) times daily. Patient not taking: Reported on 12/27/2015 12/20/14   Doran StablerWestfall, Elizabeth C, PA-C    Family History History reviewed. No pertinent family history.  Social History Social History   Tobacco Use  . Smoking status: Current Every Day Smoker  Substance Use Topics  . Alcohol use: Yes    Comment: occ  . Drug use: No     Allergies   Penicillins   Review of Systems Review of Systems  Constitutional: Negative for fever.  HENT: Negative for sore throat.   Eyes: Negative for visual disturbance.  Respiratory: Negative for shortness of breath.   Cardiovascular: Negative for  chest pain.  Gastrointestinal: Negative for abdominal pain.  Genitourinary: Negative for dysuria.  Musculoskeletal: Positive for gait problem. Negative for back pain.  Skin: Positive for wound. Negative for rash.  Neurological: Negative for headaches.     Physical Exam Updated Vital Signs BP (!) 135/92   Pulse 81   Temp 99 F (37.2 C) (Oral)   Resp 20   SpO2 100%   Physical Exam  Constitutional: She appears well-developed and well-nourished.  Morbid obesity.  HENT:  Head: Normocephalic and atraumatic.  Eyes: Conjunctivae are normal.  Neck: Neck supple.  Cardiovascular: Normal rate, regular rhythm and normal heart sounds.  Pulmonary/Chest: Effort normal and breath sounds  normal. She has no wheezes. She has no rales.  Abdominal: Soft. She exhibits no mass. There is no tenderness. There is no guarding.  Musculoskeletal:  She has a surgical incision on the top of her right foot and the stitches have all broken but are still there.  She has some swelling over her mid and distal dorsum of the foot.  No obvious pus expressed.  She has diffuse tenderness underneath the foot on the sole.  No particular warmth or erythema.  Cap refill sensory and motor still intact.  Neurological: She is alert. GCS eye subscore is 4. GCS verbal subscore is 5. GCS motor subscore is 6.  Skin: Skin is warm and dry.  Psychiatric: She has a normal mood and affect.  Nursing note and vitals reviewed.    ED Treatments / Results  Labs (all labs ordered are listed, but only abnormal results are displayed) Labs Reviewed  COMPREHENSIVE METABOLIC PANEL - Abnormal; Notable for the following components:      Result Value   Chloride 112 (*)    CO2 20 (*)    Albumin 3.3 (*)    All other components within normal limits  I-STAT CG4 LACTIC ACID, ED - Abnormal; Notable for the following components:   Lactic Acid, Venous 2.01 (*)    All other components within normal limits  CBC WITH DIFFERENTIAL/PLATELET  URINALYSIS, ROUTINE W REFLEX MICROSCOPIC  I-STAT BETA HCG BLOOD, ED ([facility], [facility], [facility] ONLY)  I-STAT CG4 LACTIC ACID, ED    EKG None  Radiology Dg Chest 2 View  Result Date: 12/02/2017 CLINICAL DATA:  Morbid obesity. Mild shortness of breath. Severe infection of the right foot. History of asthma, smoking. EXAM: CHEST - 2 VIEW COMPARISON:  None in PACs FINDINGS: The lungs are well-expanded. The interstitial markings are coarse bilaterally. There is no alveolar infiltrate or pleural effusion. The heart and pulmonary vascularity are normal. The observed bony thorax is unremarkable. IMPRESSION: Mild bilateral interstitial prominence may reflect the patient's smoking history and or reactive airway  disease. No active cardiopulmonary disease. Electronically Signed   By: David  [place] M.D.   On: 12/02/2017 09:31   Dg Foot Complete Right  Result Date: 12/02/2017 CLINICAL DATA:  Severe right foot pain. Unable to bear weight. Surgery last week EXAM: RIGHT FOOT COMPLETE - 3+ VIEW COMPARISON:  None FINDINGS: Probable prior bunionectomy in the distal 1st metatarsal. Joint spaces are maintained. No fracture, subluxation or dislocation. No radiographic changes of osteomyelitis. IMPRESSION: No acute bony abnormality. Electronically Signed   By: Charlett Nose M.D.   On: 12/02/2017 09:29    Procedures Procedures (including critical care time)  Medications Ordered in ED Medications  ibuprofen (ADVIL,MOTRIN) tablet 400 mg (has no administration in time range)  sodium chloride 0.9 % bolus 1,000 mL (has no administration in time range)  HYDROmorphone (DILAUDID) injection 1 mg (has no administration in time range)     Initial Impression / Assessment and Plan / ED Course  I have reviewed the triage vital signs and the nursing notes.  Pertinent labs & imaging results that were available during my care of the patient were reviewed by me and considered in my medical decision making (see chart for details).  Clinical Course as of Dec 03 805  Mon Dec 02, 2017  6561 37 year old female nondiabetic here with postoperative pain status post likely neuroma excision on her right foot.  She is complaining of severe pain and has some swelling and tenderness on exam.  Low-grade fever here normal white count but does have an elevated lactate.  She is getting some IV pain medicine and some fluids.  We will attempt to reach her podiatrist.   [MB]  1205 Reevaluated patient after pain medicine and she looks much more comfortable.  She had a second lactate that was elevated but she had not received her fluids yet.  We will get a third lactate.   [MB]  1240 I was able to get a message through to the patient's podiatrist  and they recommend having her follow-up with the office and they are calling in a prescription for some antibiotics.  I reviewed this with the patient.   [MB]  1359 Patient's lactate cleared and her pain is under control.  She was able to reach the podiatrist office and they have an appointment for her at 330 so I am going to discharge her here and allow him to prescribe any further medications for her.   [MB]    Clinical Course User Index [MB] Terrilee Files, MD      Final Clinical Impressions(s) / ED Diagnoses   Final diagnoses:  Post-operative pain  Wound dehiscence    ED Discharge Orders    None       Terrilee Files, MD 12/03/17 [phone]

## 2017-12-02 NOTE — ED Notes (Addendum)
Pt pulled to waiting room to wait for room and xray. On the way to waiting room pt starts to moan and groan and yell at staff saying "you do not understand how much pain I am in! I refuse to go to the waiting room..." Pt told by staff that she will not yell at us and need to calm down so that she can be taken to Xray. Pt goes to xray at this time.

## 2017-12-02 NOTE — ED Notes (Signed)
[facility]

## 2018-02-26 ENCOUNTER — Other Ambulatory Visit: Payer: Self-pay

## 2018-02-26 ENCOUNTER — Ambulatory Visit: Payer: Medicaid Other | Attending: Orthopedic Surgery

## 2018-02-26 DIAGNOSIS — M62838 Other muscle spasm: Secondary | ICD-10-CM | POA: Diagnosis present

## 2018-02-26 DIAGNOSIS — R293 Abnormal posture: Secondary | ICD-10-CM | POA: Insufficient documentation

## 2018-02-26 DIAGNOSIS — M542 Cervicalgia: Secondary | ICD-10-CM | POA: Insufficient documentation

## 2018-02-26 DIAGNOSIS — M5412 Radiculopathy, cervical region: Secondary | ICD-10-CM | POA: Diagnosis present

## 2018-02-26 NOTE — Therapy (Signed)
[facility] [address] [place], [place], [address] Phone: [phone]   Fax:  [phone]  Physical Therapy Evaluation  Patient Details  Name: Tracy Banks MRN: [id number] Date of Birth: 25-Sep-1980 Referring Provider (PT): Mack Hookavid Thompson , MD   Encounter Date: 02/26/2018  PT End of Session - 02/26/18 1027    Visit Number  1    Number of Visits  12    Date for PT Re-Evaluation  04/18/18    Authorization Type  MCD    PT Start Time  1030   late   PT Stop Time  1100    PT Time Calculation (min)  30 min    Activity Tolerance  Patient tolerated treatment well    Behavior During Therapy  [facility] for tasks assessed/performed       Past Medical History:  Diagnosis Date  . Acid reflux   . Asthma   . Carpal tunnel syndrome     Past Surgical History:  Procedure Laterality Date  . FOOT SURGERY      There were no vitals filed for this visit.   Subjective Assessment - 02/26/18 1032    Subjective  She reports onset of neck pain and pain to fingers both arms.  No incident started pain.  Was told she had carpel tunnel.  She reports was told she had fibromyalgia.  Sharp pains occur randomly. Can't lift due to pain.    Has tried chin tucks.       Pertinent History  no other exercises    Limitations  Lifting    Diagnostic tests  NCV    Patient Stated Goals  She want to decrease pain.     Currently in Pain?  Yes    Pain Score  4     Pain Location  Neck    Pain Orientation  Right;Left;Posterior    Pain Descriptors / Indicators  Sharp;Aching;Throbbing    Pain Type  Chronic pain    Pain Radiating Towards  both UE to hands    Pain Onset  More than a month ago    Pain Frequency  Constant    Aggravating Factors   lifting , drive in hands. writing in hands.      Pain Relieving Factors  medication ,           [facility] PT Assessment - 02/26/18 0001      Assessment   Medical Diagnosis  cervicalgia    Referring Provider (PT)  Mack Hookavid  Thompson , MD    Onset Date/Surgical Date  --   1-2 years ago   Next MD Visit  After PT    Prior Therapy  No      Precautions   Precautions  None      Restrictions   Weight Bearing Restrictions  No      Balance Screen   Has the patient fallen in the past 6 months  No      Prior Function   Level of Independence  Needs assistance with homemaking    Vocation  Unemployed      Cognition   Overall Cognitive Status  Within Functional Limits for tasks assessed      Posture/Postural Control   Posture Comments  forward head and rounded shoulders       ROM / Strength   AROM / PROM / Strength  AROM;PROM;Strength      AROM   Overall AROM Comments  shoulder flexion bilaterally 140 degrees , ER 80  degrees IR WNL.     AROM Assessment Site  Cervical    Cervical Flexion  40    Cervical Extension  40    Cervical - Right Side Bend  35    Cervical - Left Side Bend  25    Cervical - Right Rotation  50    Cervical - Left Rotation  45      PROM   PROM Assessment Site  Cervical      Strength   Overall Strength Comments  Grossly WNL                  Objective measurements completed on examination: See above findings.      [facility] Adult PT Treatment/Exercise - 02/26/18 0001      Neuro Re-ed    Neuro Re-ed Details   posture awareness use of support      Exercises   Exercises  Neck      Neck Exercises: Seated   Neck Retraction  5 reps    Other Seated Exercise  scap retraction              PT Education - 02/26/18 1039    Education Details  POC     Person(s) Educated  Patient    Methods  Explanation    Comprehension  Verbalized understanding       PT Short Term Goals - 02/26/18 1105      PT SHORT TERM GOAL #1   Title  She will report pain decr 10% generally    Baseline  mod to severe all times    Time  2    Period  Weeks    Status  New      PT SHORT TERM GOAL #2   Title  she will be iindependent with initial HEP    Baseline  no program    Time  2     Period  Weeks    Status  New      PT SHORT TERM GOAL #3   Title  She will demo awareness of good posture    Baseline  forward head, rounded shoulders    Time  2    Period  Weeks    Status  New        PT Long Term Goals - 02/26/18 1229      PT LONG TERM GOAL #1   Title  Pt will be I with HEP issued during episode of care    Baseline  independen with iniital HEp    Time  6    Period  Weeks    Status  New      PT LONG TERM GOAL #2   Title  She will be able to sit with head erect and not resting on shoulder     Baseline  during 5 min sitting rested head to RT and LT 2-3 x each    Time  6    Period  Weeks    Status  New      PT LONG TERM GOAL #3   Title  She will report pain decreased 50% or more and reported as intermittant.     Baseline  constant pain and severe painat eval    Time  6    Period  Weeks    Status  New      PT LONG TERM GOAL #4   Title  She will have full active rotation without incr pain to turn and look  for traffic to to see objects    Baseline  Active rotation RT 50 LT 45    Time  6    Period  Weeks    Status  New             Plan - 02/26/18 1058    Clinical Impression Statement  Tracy Banks presents with chronic neck pain over past 1-2 years . All activity incr pain with lifting particularly since she has CTS.  She does not use splints to wrists and  uses no heat of cold.   She demo poor posture with forward head and tight anterior chest and shoulders. ..  Her ROM is normal  but actively she limits hersel when asked but will lat head fully to side when sitting.   She is tingt in soft tissue builaterall shoulders and neck.   If she will do her HEp and work on [employer] and stretch she should improve with skilled PT.     History and Personal Factors relevant to plan of care:  chronic neck and arm pain , FM , CTS bilaterally, obesity.     Clinical Presentation  Unstable    Clinical Decision Making  Moderate    Rehab Potential  Good    PT  Frequency  --   3 visits    PT Duration  2 weeks   then 2x/week for 4 weeks if improving   PT Next Visit Plan  review and add to HEP , Manual and modalities    PT Home Exercise Plan  neck retraction  scapula retraction , support arms and neck when sitting    Consulted and Agree with Plan of Care  Patient       Patient will benefit from skilled therapeutic intervention in order to improve the following deficits and impairments:     Visit Diagnosis: Cervicalgia  Other muscle spasm  Abnormal posture  Radiculopathy, cervical region     Problem List There are no active problems to display for this patient.   Caprice Red  PT 02/26/2018, 12:33 PM  [facility] [address] [place], [place], [address] Phone: [phone]   Fax:  [phone]  Name: Tracy Banks MRN: [id number] Date of Birth: [birth year]

## 2018-03-07 ENCOUNTER — Encounter

## 2018-03-10 ENCOUNTER — Ambulatory Visit: Payer: Medicaid Other

## 2018-03-17 ENCOUNTER — Ambulatory Visit: Payer: Medicaid Other

## 2018-03-17 ENCOUNTER — Telehealth: Payer: Self-pay | Admitting: Physical Therapy

## 2018-03-17 NOTE — Telephone Encounter (Signed)
Spoke with Ms Lyman BishopLawrence and she reported she was waiting for earlier appointments and was not aware of scheduled appointments. She said she would be here for 03/21/18 appointment at 1015.

## 2018-03-21 ENCOUNTER — Telehealth: Payer: Self-pay | Admitting: Physical Therapy

## 2018-03-21 ENCOUNTER — Ambulatory Visit: Payer: Medicaid Other | Attending: Orthopedic Surgery | Admitting: Physical Therapy

## 2018-03-21 NOTE — Telephone Encounter (Signed)
Pt No show again she was contacted by phone and reports she forgot about her appointment and she will come in to reschedule. She was advised she can just call to reschedule but she insists she will come in.  This is her 2nd NO show in a row and she does not have anymore scheduled appointments.  Ivery Quale, PT, DPT 03/21/18 11:50 AM

## 2018-05-05 ENCOUNTER — Ambulatory Visit: Payer: Medicaid Other | Admitting: Neurology

## 2018-05-05 ENCOUNTER — Encounter: Payer: Self-pay | Admitting: Neurology

## 2018-05-05 ENCOUNTER — Other Ambulatory Visit: Payer: Self-pay | Admitting: *Deleted

## 2018-05-05 VITALS — BP 141/91 | HR 78 | Ht 68.0 in | Wt 383.0 lb

## 2018-05-05 DIAGNOSIS — G43709 Chronic migraine without aura, not intractable, without status migrainosus: Secondary | ICD-10-CM | POA: Diagnosis not present

## 2018-05-05 DIAGNOSIS — IMO0002 Reserved for concepts with insufficient information to code with codable children: Secondary | ICD-10-CM | POA: Insufficient documentation

## 2018-05-05 MED ORDER — RIZATRIPTAN BENZOATE 10 MG PO TBDP: 10.0000 mg | ORAL_TABLET | ORAL | 6 refills | Status: DC | PRN

## 2018-05-05 MED ORDER — RIZATRIPTAN BENZOATE 10 MG PO TABS: ORAL_TABLET | ORAL | 5 refills | Status: DC

## 2018-05-05 MED ORDER — TIZANIDINE HCL 4 MG PO TABS: 4.0000 mg | ORAL_TABLET | Freq: Four times a day (QID) | ORAL | 6 refills | Status: DC | PRN

## 2018-05-05 MED ORDER — ONDANSETRON 4 MG PO TBDP: 4.0000 mg | ORAL_TABLET | Freq: Three times a day (TID) | ORAL | 6 refills | Status: DC | PRN

## 2018-05-05 MED ORDER — TOPIRAMATE 100 MG PO TABS: 100.0000 mg | ORAL_TABLET | Freq: Two times a day (BID) | ORAL | 11 refills | Status: DC

## 2018-05-05 NOTE — Progress Notes (Signed)
PATIENT: Tracy Banks DOB: 1980-04-02  Chief Complaint  Patient presents with  . Migraine    Reports daily headaches.  She is currently on propranolol, amitritpyline and Emagility.  She uses promethazine for migraines.  She is taking both meloxicam and indomethacin for gout pain.   Marland Kitchen PCP    Rosario Adie, Juel Burrow, FNP     HISTORICAL  Tracy Banks Is a 38 years old female, seen in request by her primary care nurse practitioner Maryagnes Amos, for evaluation of migraine headache,  I have reviewed and summarized the referring note from the referring physician.  She reported a history of migraine headaches since high school, but her migraine are lateralized severe pounding headache with associated light noise sensitivity, lasting for hours, going to sleep usually helps, she used to have migraine 2-3 times each month, trigger for her migraines are light, noise, smell from freshly cut grass, stress, sleep deprivation, weather changes, her son, mother also suffered migraines,  She also has history of obesity, has been on blood pressures for over past 10 years, her weight is overall stable, there was no significant visual changes.  She complains of slow worsening migraine headaches over the past couple years, has been getting injection as a urgent abortive treatment at her primary care's office every 2 to 4 weeks, Imitrex provide limited help, Phenergan, sleep is usually helpful, she has been taking over-the-counter medication, in combination with Imitrex almost every other day to control her headache over the past few months.  She also had visual auras preceding her migraine, blurry vision, distortion of the colors,   She was started on Inderal 40 mg twice a day, Emagality since February 2020, no significant benefit noticed yet.  She is also on polypharmacy for fibromyalgia, Elavil 50 mg every night Cymbalta 60 mg daily, gabapentin 900 mg 3 times a day.  REVIEW OF SYSTEMS: Full 14  system review of systems performed and notable only for as above All other review of systems were negative.  ALLERGIES: Allergies  Allergen Reactions  . Penicillins     Rapid heart beat Has patient had a PCN reaction causing immediate rash, facial/tongue/throat swelling, SOB or lightheadedness with hypotension: no Has patient had a PCN reaction causing severe rash involving mucus membranes or skin necrosis: no Has patient had a PCN reaction that required hospitalization unknown Has patient had a PCN reaction occurring within the last 10 years: no If all of the above answers are "NO", then may proceed with Cephalosporin use.     HOME MEDICATIONS: Current Outpatient Medications  Medication Sig Dispense Refill  . amitriptyline (ELAVIL) 50 MG tablet Take 50 mg by mouth at bedtime.  2  . cyclobenzaprine (FLEXERIL) 10 MG tablet Take 10 mg by mouth 3 (three) times daily as needed for muscle spasms.  0  . DULoxetine (CYMBALTA) 60 MG capsule Take 60 mg by mouth 2 (two) times daily.  0  . EMGALITY 120 MG/ML SOAJ ADM 1 ML [place] 1 TIME A MONTH    . esomeprazole (NEXIUM) 40 MG capsule Take 40 mg by mouth daily at 12 noon.    . fluticasone (FLOVENT HFA) 110 MCG/ACT inhaler Inhale 2 puffs into the lungs 2 (two) times daily.    Marland Kitchen gabapentin (NEURONTIN) 300 MG capsule Take 900 mg by mouth 3 (three) times daily.   0  . indomethacin (INDOCIN) 50 MG capsule Take 50 mg by mouth 2 (two) times daily.  0  . medroxyPROGESTERone (DEPO-PROVERA) 150 MG/ML injection  Inject 150 mg into the muscle every 3 (three) months.    . meloxicam (MOBIC) 7.5 MG tablet TK 1 TO 2 TS PO D PRF PAIN 30 DAYS    . oxybutynin (DITROPAN) 5 MG tablet Take 5 mg by mouth 2 (two) times daily.  0  . promethazine (PHENERGAN) 25 MG tablet TK 1 T PO Q 6 TO 8 H PRF MIGRAINE  0  . propranolol (INDERAL) 40 MG tablet TK 1 T PO BID     No current facility-administered medications for this visit.     PAST MEDICAL HISTORY: Past Medical History:    Diagnosis Date  . Acid reflux   . Arthritis   . Asthma   . Carpal tunnel syndrome   . Eczema   . Fibromyalgia   . Gout   . Migraine   . Overactive bladder   . Polyarthralgia     PAST SURGICAL HISTORY: Past Surgical History:  Procedure Laterality Date  . ANKLE SURGERY Bilateral   . NEUROMA SURGERY      FAMILY HISTORY: Family History  Problem Relation Age of Onset  . Sarcoidosis Mother   . Migraines Mother   . Hypertension Mother   . Osteoarthritis Mother   . Diabetes Mother   . Fibromyalgia Mother   . Healthy Father     SOCIAL HISTORY: Social History   Socioeconomic History  . Marital status: Single    Spouse name: Not on file  . Number of children: 3  . Years of education: college  . Highest education level: Not on file  Occupational History  . Not on file  Social Needs  . Financial resource strain: Not on file  . Food insecurity:    Worry: Not on file    Inability: Not on file  . Transportation needs:    Medical: Not on file    Non-medical: Not on file  Tobacco Use  . Smoking status: Current Every Day Smoker    Types: Cigarettes  . Smokeless tobacco: Never Used  . Tobacco comment: 1 pack per week  Substance and Sexual Activity  . Alcohol use: Yes    Comment: occ  . Drug use: No  . Sexual activity: Not on file  Lifestyle  . Physical activity:    Days per week: Not on file    Minutes per session: Not on file  . Stress: Not on file  Relationships  . Social connections:    Talks on phone: Not on file    Gets together: Not on file    Attends religious service: Not on file    Active member of club or organization: Not on file    Attends meetings of clubs or organizations: Not on file    Relationship status: Not on file  . Intimate partner violence:    Fear of current or ex partner: Not on file    Emotionally abused: Not on file    Physically abused: Not on file    Forced sexual activity: Not on file  Other Topics Concern  . Not on file   Social History Narrative   Lives at home with children.   Right-handed.   Caffeine use:  One can soda per day.     PHYSICAL EXAM   Vitals:   05/05/18 0906  BP: (!) 141/91  Pulse: 78  Weight: (!) 383 lb (173.7 kg)  Height: 5\' 8"  (1.727 m)    Not recorded      Body mass index is 58.23 kg/m.  PHYSICAL EXAMNIATION:  Gen: NAD, conversant, well nourised, obese, well groomed                     Cardiovascular: Regular rate rhythm, no peripheral edema, warm, nontender. Eyes: Conjunctivae clear without exudates or hemorrhage Neck: Supple, no carotid bruits. Pulmonary: Clear to auscultation bilaterally   NEUROLOGICAL EXAM:  MENTAL STATUS: Speech:    Speech is normal; fluent and spontaneous with normal comprehension.  Cognition:     Orientation to time, place and person     Normal recent and remote memory     Normal Attention span and concentration     Normal Language, naming, repeating,spontaneous speech     Fund of knowledge   CRANIAL NERVES: CN II: Visual fields are full to confrontation. Fundoscopic exam is normal with sharp discs and no vascular changes. Pupils are round equal and briskly reactive to light. CN III, IV, VI: extraocular movement are normal. No ptosis. CN V: Facial sensation is intact to pinprick in all 3 divisions bilaterally. Corneal responses are intact.  CN VII: Face is symmetric with normal eye closure and smile. CN VIII: Hearing is normal to rubbing fingers CN IX, X: Palate elevates symmetrically. Phonation is normal. CN XI: Head turning and shoulder shrug are intact CN XII: Tongue is midline with normal movements and no atrophy.  MOTOR: There is no pronator drift of out-stretched arms. Muscle bulk and tone are normal. Muscle strength is normal.  REFLEXES: Reflexes are 2+ and symmetric at the biceps, triceps, knees, and ankles. Plantar responses are flexor.  SENSORY: Intact to light touch, pinprick, positional sensation and vibratory  sensation are intact in fingers and toes.  COORDINATION: Rapid alternating movements and fine finger movements are intact. There is no dysmetria on finger-to-nose and heel-knee-shin.    GAIT/STANCE: Posture is normal. Gait is steady with normal steps, base, arm swing, and turning. Heel and toe walking are normal. Tandem gait is normal.  Romberg is absent.   DIAGNOSTIC DATA (LABS, IMAGING, TESTING) - I reviewed patient records, labs, notes, testing and imaging myself where available.   ASSESSMENT AND PLAN  Tracy Banks is a 38 y.o. female   Chronic migraine headaches  She also has visual auras with migraine, will add on Topamax 100 mg twice a day as preventive medications  Continue preventive medications Emaglity, Inderal 40mg  bid,   If her headache is under better control, may back off some of the preventive medication such as Inderal later  Suboptimal response to Imitrex as needed, will try Maxalt 10 mg as needed may combine it together with tizanidine, Zofran as needed  Return to clinic with NP Sarah in 4 weeks   Levert FeinsteinYijun Hero Mccathern, M.D. Ph.D.  [facility] [address], [address] [place], [place] [address] Ph: [phone] Fax: [phone]  CC: Maryagnes AmosStarkes-Perry, Takia S, FNP

## 2018-06-04 ENCOUNTER — Telehealth: Payer: Self-pay

## 2018-06-04 ENCOUNTER — Ambulatory Visit: Payer: Medicaid Other | Admitting: Neurology

## 2018-06-04 NOTE — Telephone Encounter (Signed)
Patient was a no call/no show for their appointment today.   

## 2018-06-04 NOTE — Progress Notes (Deleted)
PATIENT: Tracy Banks DOB: [birth year]  REASON FOR VISIT: follow up HISTORY FROM: patient  HISTORY OF PRESENT ILLNESS: Today 06/04/18  HISTORY  Tracy Banks Is a 38 years old female, seen in request by her primary care nurse practitioner Maryagnes AmosStarkes-Perry, Takia S, for evaluation of migraine headache,  I have reviewed and summarized the referring note from the referring physician.  She reported a history of migraine headaches since high school, but her migraine are lateralized severe pounding headache with associated light noise sensitivity, lasting for hours, going to sleep usually helps, she used to have migraine 2-3 times each month, trigger for her migraines are light, noise, smell from freshly cut grass, stress, sleep deprivation, weather changes, her son, mother also suffered migraines,  She also has history of obesity, has been on blood pressures for over past 10 years, her weight is overall stable, there was no significant visual changes.  She complains of slow worsening migraine headaches over the past couple years, has been getting injection as a urgent abortive treatment at her primary care's office every 2 to 4 weeks, Imitrex provide limited help, Phenergan, sleep is usually helpful, she has been taking over-the-counter medication, in combination with Imitrex almost every other day to control her headache over the past few months.  She also had visual auras preceding her migraine, blurry vision, distortion of the colors,   She was started on Inderal 40 mg twice a day, Emagality since February 2020, no significant benefit noticed yet.  She is also on polypharmacy for fibromyalgia, Elavil 50 mg every night Cymbalta 60 mg daily, gabapentin 900 mg 3 times a day.  Update June 04, 2018 SS: 38 yo female 4 week f/u for migraine h/a; recently started on Topamax 100 mg BID, continue Emgality, Inderal 40 mg BID, switched from Imitrex to Maxalt 10 mg as needed, can combine with  tizanidine, zofran as needed.   REVIEW OF SYSTEMS: Out of a complete 14 system review of symptoms, the patient complains only of the following symptoms, and all other reviewed systems are negative.  ALLERGIES: Allergies  Allergen Reactions  . Penicillins     Rapid heart beat Has patient had a PCN reaction causing immediate rash, facial/tongue/throat swelling, SOB or lightheadedness with hypotension: no Has patient had a PCN reaction causing severe rash involving mucus membranes or skin necrosis: no Has patient had a PCN reaction that required hospitalization unknown Has patient had a PCN reaction occurring within the last 10 years: no If all of the above answers are "NO", then may proceed with Cephalosporin use.     HOME MEDICATIONS: Outpatient Medications Prior to Visit  Medication Sig Dispense Refill  . amitriptyline (ELAVIL) 50 MG tablet Take 50 mg by mouth at bedtime.  2  . cyclobenzaprine (FLEXERIL) 10 MG tablet Take 10 mg by mouth 3 (three) times daily as needed for muscle spasms.  0  . DULoxetine (CYMBALTA) 60 MG capsule Take 60 mg by mouth 2 (two) times daily.  0  . EMGALITY 120 MG/ML SOAJ ADM 1 ML [place] 1 TIME A MONTH    . esomeprazole (NEXIUM) 40 MG capsule Take 40 mg by mouth daily at 12 noon.    . fluticasone (FLOVENT HFA) 110 MCG/ACT inhaler Inhale 2 puffs into the lungs 2 (two) times daily.    Marland Kitchen. gabapentin (NEURONTIN) 300 MG capsule Take 900 mg by mouth 3 (three) times daily.   0  . indomethacin (INDOCIN) 50 MG capsule Take 50 mg by mouth 2 (two)  times daily.  0  . medroxyPROGESTERone (DEPO-PROVERA) 150 MG/ML injection Inject 150 mg into the muscle every 3 (three) months.    . meloxicam (MOBIC) 7.5 MG tablet TK 1 TO 2 TS PO D PRF PAIN 30 DAYS    . ondansetron (ZOFRAN ODT) 4 MG disintegrating tablet Take 1 tablet (4 mg total) by mouth every 8 (eight) hours as needed. 20 tablet 6  . oxybutynin (DITROPAN) 5 MG tablet Take 5 mg by mouth 2 (two) times daily.  0  . propranolol  (INDERAL) 40 MG tablet TK 1 T PO BID    . rizatriptan (MAXALT) 10 MG tablet Take 1 tab at onset of migraine.  May repeat in 2 hrs, if needed.  Max dose: 2 tabs/day. This is a 30 day prescription. 10 tablet 5  . tiZANidine (ZANAFLEX) 4 MG tablet Take 1 tablet (4 mg total) by mouth every 6 (six) hours as needed for muscle spasms. 30 tablet 6  . topiramate (TOPAMAX) 100 MG tablet Take 1 tablet (100 mg total) by mouth 2 (two) times daily. 60 tablet 11   No facility-administered medications prior to visit.     PAST MEDICAL HISTORY: Past Medical History:  Diagnosis Date  . Acid reflux   . Arthritis   . Asthma   . Carpal tunnel syndrome   . Eczema   . Fibromyalgia   . Gout   . Migraine   . Overactive bladder   . Polyarthralgia     PAST SURGICAL HISTORY: Past Surgical History:  Procedure Laterality Date  . ANKLE SURGERY Bilateral   . NEUROMA SURGERY      FAMILY HISTORY: Family History  Problem Relation Age of Onset  . Sarcoidosis Mother   . Migraines Mother   . Hypertension Mother   . Osteoarthritis Mother   . Diabetes Mother   . Fibromyalgia Mother   . Healthy Father     SOCIAL HISTORY: Social History   Socioeconomic History  . Marital status: Single    Spouse name: Not on file  . Number of children: 3  . Years of education: college  . Highest education level: Not on file  Occupational History  . Not on file  Social Needs  . Financial resource strain: Not on file  . Food insecurity:    Worry: Not on file    Inability: Not on file  . Transportation needs:    Medical: Not on file    Non-medical: Not on file  Tobacco Use  . Smoking status: Current Every Day Smoker    Types: Cigarettes  . Smokeless tobacco: Never Used  . Tobacco comment: 1 pack per week  Substance and Sexual Activity  . Alcohol use: Yes    Comment: occ  . Drug use: No  . Sexual activity: Not on file  Lifestyle  . Physical activity:    Days per week: Not on file    Minutes per session:  Not on file  . Stress: Not on file  Relationships  . Social connections:    Talks on phone: Not on file    Gets together: Not on file    Attends religious service: Not on file    Active member of club or organization: Not on file    Attends meetings of clubs or organizations: Not on file    Relationship status: Not on file  . Intimate partner violence:    Fear of current or ex partner: Not on file    Emotionally abused: Not on  file    Physically abused: Not on file    Forced sexual activity: Not on file  Other Topics Concern  . Not on file  Social History Narrative   Lives at home with children.   Right-handed.   Caffeine use:  One can soda per day.      PHYSICAL EXAM  There were no vitals filed for this visit. There is no height or weight on file to calculate BMI.  Generalized: Well developed, in no acute distress   Neurological examination  Mentation: Alert oriented to time, place, history taking. Follows all commands speech and language fluent Cranial nerve II-XII: Pupils were equal round reactive to light. Extraocular movements were full, visual field were full on confrontational test. Facial sensation and strength were normal. Uvula tongue midline. Head turning and shoulder shrug  were normal and symmetric. Motor: The motor testing reveals 5 over 5 strength of all 4 extremities. Good symmetric motor tone is noted throughout.  Sensory: Sensory testing is intact to soft touch on all 4 extremities. No evidence of extinction is noted.  Coordination: Cerebellar testing reveals good finger-nose-finger and heel-to-shin bilaterally.  Gait and station: Gait is normal. Tandem gait is normal. Romberg is negative. No drift is seen.  Reflexes: Deep tendon reflexes are symmetric and normal bilaterally.   DIAGNOSTIC DATA (LABS, IMAGING, TESTING) - I reviewed patient records, labs, notes, testing and imaging myself where available.  Lab Results  Component Value Date   WBC 8.6  12/02/2017   HGB 12.4 12/02/2017   HCT 39.8 12/02/2017   MCV 89.4 12/02/2017   PLT 228 12/02/2017      Component Value Date/Time   NA 142 12/02/2017 0841   K 4.4 12/02/2017 0841   CL 112 (H) 12/02/2017 0841   CO2 20 (L) 12/02/2017 0841   GLUCOSE 97 12/02/2017 0841   BUN 15 12/02/2017 0841   CREATININE 0.84 12/02/2017 0841   CALCIUM 9.0 12/02/2017 0841   PROT 6.8 12/02/2017 0841   ALBUMIN 3.3 (L) 12/02/2017 0841   AST 30 12/02/2017 0841   ALT 28 12/02/2017 0841   ALKPHOS 68 12/02/2017 0841   BILITOT 0.6 12/02/2017 0841   GFRNONAA >60 12/02/2017 0841   GFRAA >60 12/02/2017 0841   No results found for: CHOL, HDL, LDLCALC, LDLDIRECT, TRIG, CHOLHDL No results found for: RWER1V No results found for: VITAMINB12 No results found for: TSH    ASSESSMENT AND PLAN 38 y.o. year old female  has a past medical history of Acid reflux, Arthritis, Asthma, Carpal tunnel syndrome, Eczema, Fibromyalgia, Gout, Migraine, Overactive bladder, and Polyarthralgia. here with ***   I spent 15 minutes with the patient. 50% of this time was spent   Margie Ege, [place], DNP 06/04/2018, 7:46 AM [facility] [address] [place], [place] [address] [phone]

## 2018-08-13 ENCOUNTER — Other Ambulatory Visit: Payer: Self-pay | Admitting: Neurology

## 2018-09-11 ENCOUNTER — Telehealth (INDEPENDENT_AMBULATORY_CARE_PROVIDER_SITE_OTHER): Payer: Medicaid Other | Admitting: Neurology

## 2018-09-11 ENCOUNTER — Encounter: Payer: Self-pay | Admitting: Neurology

## 2018-09-11 DIAGNOSIS — IMO0002 Reserved for concepts with insufficient information to code with codable children: Secondary | ICD-10-CM

## 2018-09-11 DIAGNOSIS — G43709 Chronic migraine without aura, not intractable, without status migrainosus: Secondary | ICD-10-CM | POA: Diagnosis not present

## 2018-09-11 MED ORDER — TIZANIDINE HCL 4 MG PO TABS: 4.0000 mg | ORAL_TABLET | Freq: Four times a day (QID) | ORAL | 11 refills | Status: DC | PRN

## 2018-09-11 MED ORDER — PROPRANOLOL HCL 40 MG PO TABS: 40.0000 mg | ORAL_TABLET | Freq: Two times a day (BID) | ORAL | 4 refills | Status: DC

## 2018-09-11 NOTE — Progress Notes (Signed)
    PATIENT: Tracy Banks DOB: 06/18/1980  No chief complaint on file.    HISTORICAL  Tracy Banks Is a 38 years old female, seen in request by her primary care nurse practitioner Suella Broad, for evaluation of migraine headache,  I have reviewed and summarized the referring note from the referring physician.  She reported a history of migraine headaches since high school, but her migraine are lateralized severe pounding headache with associated light noise sensitivity, lasting for hours, going to sleep usually helps, she used to have migraine 2-3 times each month, trigger for her migraines are light, noise, smell from freshly cut grass, stress, sleep deprivation, weather changes, her son, mother also suffered migraines,  She also has history of obesity, has been on blood pressures for over past 10 years, her weight is overall stable, there was no significant visual changes.  She complains of slow worsening migraine headaches over the past couple years, has been getting injection as a urgent abortive treatment at her primary care's office every 2 to 4 weeks, Imitrex provide limited help, Phenergan, sleep is usually helpful, she has been taking over-the-counter medication, in combination with Imitrex almost every other day to control her headache over the past few months.  She also had visual auras preceding her migraine, blurry vision, distortion of the colors,   She was started on Inderal 40 mg twice a day, Emagality since February 2020, no significant benefit noticed yet.  She is also on polypharmacy for fibromyalgia, Elavil 50 mg every night Cymbalta 60 mg daily, gabapentin 900 mg 3 times a day.  Virtual Visit via Video  I connected with Tracy Banks on 09/11/18 at  by Video and verified that I am speaking with the correct person using two identifiers.   I discussed the limitations, risks, security and privacy concerns of performing an evaluation and management service  by video and the availability of in person appointments. I also discussed with the patient that there may be a patient responsible charge related to this service. The patient expressed understanding and agreed to proceed.   History of Present Illness: She still has frequent migraine headaches, 3-4 times a week, but the severity of her headache has much improved, her headache only lasted about 4-5 minutes, left retro-orbital pressure headaches, with light noise sensitivity.  She is now on polypharmacy treatment, taking Cymbalta 60mg  daily, Elavil 50mg  qhs, gabapentin 900mg  qid propanolol 40mg  bid, Topamax 100mg  bid, Emgality 120mg  every month.   Observations/Objective: I have reviewed problem lists, medications, allergies.  Assessment and Plan: Chronic migraine headaches Polypharmacy  She also has visual auras with migraine,  Topamax 100 mg twice a day as preventive medications was helpful.  Continue preventive medications Emaglity, Inderal 40mg  bid,   Suboptimal response to Imitrex as needed in the past, keep Maxalt 10 mg as needed may combine it together with tizanidine, Zofran as needed  Continue follow up with her PCP.     Follow Up Instructions:  As needed    I discussed the assessment and treatment plan with the patient. The patient was provided an opportunity to ask questions and all were answered. The patient agreed with the plan and demonstrated an understanding of the instructions.   The patient was advised to call back or seek an in-person evaluation if the symptoms worsen or if the condition fails to improve as anticipated.  I provided 15 minutes of non-face-to-face time during this encounter.   Marcial Pacas, MD Ph.D.

## 2019-06-11 ENCOUNTER — Encounter: Payer: Self-pay | Admitting: Neurology

## 2019-06-11 ENCOUNTER — Other Ambulatory Visit: Payer: Self-pay

## 2019-06-11 ENCOUNTER — Ambulatory Visit (INDEPENDENT_AMBULATORY_CARE_PROVIDER_SITE_OTHER): Payer: Medicare Other | Admitting: Neurology

## 2019-06-11 VITALS — BP 138/88 | HR 80 | Temp 97.8°F | Ht 68.0 in | Wt 368.0 lb

## 2019-06-11 DIAGNOSIS — R202 Paresthesia of skin: Secondary | ICD-10-CM

## 2019-06-11 DIAGNOSIS — G43709 Chronic migraine without aura, not intractable, without status migrainosus: Secondary | ICD-10-CM

## 2019-06-11 DIAGNOSIS — IMO0002 Reserved for concepts with insufficient information to code with codable children: Secondary | ICD-10-CM

## 2019-06-11 MED ORDER — ZOLMITRIPTAN 5 MG PO TBDP: 5.0000 mg | ORAL_TABLET | ORAL | 6 refills | Status: DC | PRN

## 2019-06-11 MED ORDER — AIMOVIG 70 MG/ML ~~LOC~~ SOAJ: 70.0000 mg | SUBCUTANEOUS | 11 refills | Status: DC

## 2019-06-11 MED ORDER — NORTRIPTYLINE HCL 25 MG PO CAPS: 100.0000 mg | ORAL_CAPSULE | Freq: Every day | ORAL | 6 refills | Status: DC

## 2019-06-11 NOTE — Progress Notes (Signed)
PATIENT: Tracy Banks DOB: 04/22/1980  Chief Complaint  Patient presents with  . Migraine    She estimates 5-7 migraines per week, despite her medications. She has the following for migraines: Inderal, Topamax, Zofran, Maxalt, Zanaflex. She had Emgality until last month but Medicare denied it. Feels the worsening of her migraines are due to the lack of this medication.   . Numbness    Reports intermittent numbness/tingling in hands and feet. She is taking gabapentin 300mg , three capsules TID. It is helpful but does not cover her symptoms completely.   . Podiatry    , DPM  . PCP    Merwyn Katos, FNP     HISTORICAL  Tracy Banks Is a 39 years old female, seen in request by her primary care nurse practitioner 24, for evaluation of migraine headache,  I have reviewed and summarized the referring note from the referring physician.  She reported a history of migraine headaches since high school, but her migraine are lateralized severe pounding headache with associated light noise sensitivity, lasting for hours, going to sleep usually helps, she used to have migraine 2-3 times each month, trigger for her migraines are light, noise, smell from freshly cut grass, stress, sleep deprivation, weather changes, her son, mother also suffered migraines,  She also has history of obesity, has been on blood pressures for over past 10 years, her weight is overall stable, there was no significant visual changes.  She complains of slow worsening migraine headaches over the past couple years, has been getting injection as a urgent abortive treatment at her primary care's office every 2 to 4 weeks, Imitrex provide limited help, Phenergan, sleep is usually helpful, she has been taking over-the-counter medication, in combination with Imitrex almost every other day to control her headache over the past few months.  She also had visual auras preceding her migraine,  blurry vision, distortion of the colors,   She was started on Inderal 40 mg twice a day, Emagality since February 2020, no significant benefit noticed yet.  She is also on polypharmacy for fibromyalgia, Elavil 50 mg every night Cymbalta 60 mg daily, gabapentin 900 mg 3 times a day.  UPDATE June 11 2019: She was on Emgality for about a year, during that period of time her headache was under reasonable control, it was stopped since February 2021, since then, she noticed increased to headache, 2-3 times headache daily, bilateral temporal parietal region pounding headache, with light, noise sensitivity, she has tried Maxalt, without helping her headaches  She is also on preventive medication Topamax, Inderal, she complains of difficulty sleeping, had frequent headaches when trying to go to sleep  In addition, she complains of bilateral hands and toes numbness tingling since 2020, intermittent involving all toes, and all 5 fingers, it hurt when she bear weight, she also complains of right-sided neck pain, radiating pain to right shoulder  She denies persistent upper or lower extremity sensory loss, weakness, incontinence REVIEW OF SYSTEMS: Full 14 system review of systems performed and notable only for as above All other review of systems were negative.  ALLERGIES: Allergies  Allergen Reactions  . Penicillins     Rapid heart beat Has patient had a PCN reaction causing immediate rash, facial/tongue/throat swelling, SOB or lightheadedness with hypotension: no Has patient had a PCN reaction causing severe rash involving mucus membranes or skin necrosis: no Has patient had a PCN reaction that required hospitalization unknown Has patient had a PCN reaction occurring  within the last 10 years: no If all of the above answers are "NO", then may proceed with Cephalosporin use.     HOME MEDICATIONS: Current Outpatient Medications  Medication Sig Dispense Refill  . amitriptyline (ELAVIL) 50 MG tablet  Take 50 mg by mouth at bedtime.  2  . cyclobenzaprine (FLEXERIL) 10 MG tablet Take 10 mg by mouth 3 (three) times daily as needed for muscle spasms.  0  . DULoxetine (CYMBALTA) 60 MG capsule Take 60 mg by mouth 2 (two) times daily.  0  . EMGALITY 120 MG/ML SOAJ ADM 1 ML [place] 1 TIME A MONTH    . esomeprazole (NEXIUM) 40 MG capsule Take 40 mg by mouth daily at 12 noon.    . fluticasone (FLOVENT HFA) 110 MCG/ACT inhaler Inhale 2 puffs into the lungs 2 (two) times daily.    Marland Kitchen gabapentin (NEURONTIN) 300 MG capsule Take 900 mg by mouth 3 (three) times daily.   0  . Nabumetone (RELAFEN DS) 1000 MG TABS Take 1 tablet by mouth daily.    . ondansetron (ZOFRAN ODT) 4 MG disintegrating tablet Take 1 tablet (4 mg total) by mouth every 8 (eight) hours as needed. 20 tablet 6  . oxybutynin (DITROPAN) 5 MG tablet Take 5 mg by mouth 2 (two) times daily.  0  . propranolol (INDERAL) 40 MG tablet Take 1 tablet (40 mg total) by mouth 2 (two) times daily. 180 tablet 4  . rizatriptan (MAXALT) 10 MG tablet Take 1 tab at onset of migraine.  May repeat in 2 hrs, if needed.  Max dose: 2 tabs/day. This is a 30 day prescription. 10 tablet 5  . tiZANidine (ZANAFLEX) 4 MG tablet Take 1 tablet (4 mg total) by mouth every 6 (six) hours as needed for muscle spasms. 30 tablet 11  . topiramate (TOPAMAX) 100 MG tablet Take 1 tablet (100 mg total) by mouth 2 (two) times daily. 60 tablet 11  . TRIAMTERENE PO Take 1 tablet by mouth daily. Unsure of dosage     No current facility-administered medications for this visit.    PAST MEDICAL HISTORY: Past Medical History:  Diagnosis Date  . Acid reflux   . Arthritis   . Asthma   . Carpal tunnel syndrome   . Eczema   . Fibromyalgia   . Gout   . Migraine   . Overactive bladder   . Polyarthralgia     PAST SURGICAL HISTORY: Past Surgical History:  Procedure Laterality Date  . ANKLE SURGERY Bilateral   . NEUROMA SURGERY      FAMILY HISTORY: Family History  Problem Relation Age  of Onset  . Sarcoidosis Mother   . Migraines Mother   . Hypertension Mother   . Osteoarthritis Mother   . Diabetes Mother   . Fibromyalgia Mother   . Healthy Father     SOCIAL HISTORY: Social History   Socioeconomic History  . Marital status: Single    Spouse name: Not on file  . Number of children: 3  . Years of education: college  . Highest education level: Not on file  Occupational History  . Not on file  Tobacco Use  . Smoking status: Current Every Day Smoker    Types: Cigarettes  . Smokeless tobacco: Never Used  . Tobacco comment: 1 pack per week  Substance and Sexual Activity  . Alcohol use: Yes    Comment: occ  . Drug use: No  . Sexual activity: Not on file  Other Topics Concern  .  Not on file  Social History Narrative   Lives at home with children.   Right-handed.   Caffeine use:  One can soda per day.   Social Determinants of Health   Financial Resource Strain:   . Difficulty of Paying Living Expenses:   Food Insecurity:   . Worried About Charity fundraiser in the Last Year:   . Arboriculturist in the Last Year:   Transportation Needs:   . Film/video editor (Medical):   Marland Kitchen Lack of Transportation (Non-Medical):   Physical Activity:   . Days of Exercise per Week:   . Minutes of Exercise per Session:   Stress:   . Feeling of Stress :   Social Connections:   . Frequency of Communication with Friends and Family:   . Frequency of Social Gatherings with Friends and Family:   . Attends Religious Services:   . Active Member of Clubs or Organizations:   . Attends Archivist Meetings:   Marland Kitchen Marital Status:   Intimate Partner Violence:   . Fear of Current or Ex-Partner:   . Emotionally Abused:   Marland Kitchen Physically Abused:   . Sexually Abused:      PHYSICAL EXAM   Vitals:   06/11/19 0929  BP: 138/88  Pulse: 80  Temp: 97.8 F (36.6 C)  Weight: (!) 368 lb (166.9 kg)  Height: 5\' 8"  (1.727 m)    Not recorded      Body mass index is  55.95 kg/m.  PHYSICAL EXAMNIATION:  Gen: NAD, conversant, well nourised, obese, well groomed                     Cardiovascular: Regular rate rhythm, no peripheral edema, warm, nontender. Eyes: Conjunctivae clear without exudates or hemorrhage Neck: Supple, no carotid bruits. Pulmonary: Clear to auscultation bilaterally   NEUROLOGICAL EXAM:  MENTAL STATUS: Speech:    Speech is normal; fluent and spontaneous with normal comprehension.  Cognition:     Orientation to time, place and person     Normal recent and remote memory     Normal Attention span and concentration     Normal Language, naming, repeating,spontaneous speech     Fund of knowledge   CRANIAL NERVES: CN II: Visual fields are full to confrontation. Fundoscopic exam is normal with sharp discs and no vascular changes. Pupils are round equal and briskly reactive to light. CN III, IV, VI: extraocular movement are normal. No ptosis. CN V: Facial sensation is intact to pinprick in all 3 divisions bilaterally. Corneal responses are intact.  CN VII: Face is symmetric with normal eye closure and smile. CN VIII: Hearing is normal to rubbing fingers CN IX, X: Palate elevates symmetrically. Phonation is normal. CN XI: Head turning and shoulder shrug are intact CN XII: Tongue is midline with normal movements and no atrophy.  MOTOR: There is no pronator drift of out-stretched arms. Muscle bulk and tone are normal. Muscle strength is normal.  REFLEXES: Reflexes are 1 and symmetric at the biceps, triceps, knees, and ankles. Plantar responses are flexor.  SENSORY: Intact to light touch, pinprick, positional sensation and vibratory sensation are intact in fingers and toes.  COORDINATION: Rapid alternating movements and fine finger movements are intact. There is no dysmetria on finger-to-nose and heel-knee-shin.    GAIT/STANCE: She can get up from seated position arms crossed, steady  DIAGNOSTIC DATA (LABS, IMAGING, TESTING) -  I reviewed patient records, labs, notes, testing and imaging myself where available.  ASSESSMENT AND PLAN  Lindsi Bayliss is a 39 y.o. female   Chronic migraine headaches  Continue Topamax 100 mg twice a day and Inderal as preventive medications  Insurance would not pay for Emgality, will try Aimovig 70 mg every month as preventive medications  Also add on nortriptyline 25 mg titrating to 100 mg every night  She reported suboptimal response to Imitrex, Maxalt, will try Zomig 5 mg as needed  Bilateral upper and lower extremity paresthesia, burning pain  Suggested neuropathic pain  Laboratory evaluation for small fiber neuropathy  EMG nerve conduction study  Levert Feinstein, M.D. Ph.D.  [facility] [address] [place], [place] [address] Ph: [phone] Fax: [phone]  CC: Maryagnes Amos, FNP

## 2019-06-16 LAB — CBC WITH DIFFERENTIAL
Basophils Absolute: 0 10*3/uL (ref 0.0–0.2)
Basos: 0 %
EOS (ABSOLUTE): 0.2 10*3/uL (ref 0.0–0.4)
Eos: 3 %
Hematocrit: 41.8 % (ref 34.0–46.6)
Hemoglobin: 13.6 g/dL (ref 11.1–15.9)
Immature Grans (Abs): 0 10*3/uL (ref 0.0–0.1)
Immature Granulocytes: 0 %
Lymphocytes Absolute: 4 10*3/uL — ABNORMAL HIGH (ref 0.7–3.1)
Lymphs: 55 %
MCH: 27.5 pg (ref 26.6–33.0)
MCHC: 32.5 g/dL (ref 31.5–35.7)
MCV: 84 fL (ref 79–97)
Monocytes Absolute: 0.4 10*3/uL (ref 0.1–0.9)
Monocytes: 5 %
Neutrophils Absolute: 2.7 10*3/uL (ref 1.4–7.0)
Neutrophils: 37 %
RBC: 4.95 x10E6/uL (ref 3.77–5.28)
RDW: 13.5 % (ref 11.7–15.4)
WBC: 7.3 10*3/uL (ref 3.4–10.8)

## 2019-06-16 LAB — PROTEIN ELECTROPHORESIS
A/G Ratio: 0.9 (ref 0.7–1.7)
Albumin ELP: 3.5 g/dL (ref 2.9–4.4)
Alpha 1: 0.2 g/dL (ref 0.0–0.4)
Alpha 2: 0.9 g/dL (ref 0.4–1.0)
Beta: 1 g/dL (ref 0.7–1.3)
Gamma Globulin: 1.5 g/dL (ref 0.4–1.8)
Globulin, Total: 3.7 g/dL (ref 2.2–3.9)

## 2019-06-16 LAB — TSH: TSH: 1.4 u[IU]/mL (ref 0.450–4.500)

## 2019-06-16 LAB — COMPREHENSIVE METABOLIC PANEL
ALT: 28 IU/L (ref 0–32)
AST: 29 IU/L (ref 0–40)
Albumin/Globulin Ratio: 1.3 (ref 1.2–2.2)
Albumin: 4.1 g/dL (ref 3.8–4.8)
Alkaline Phosphatase: 95 IU/L (ref 39–117)
BUN/Creatinine Ratio: 10 (ref 9–23)
BUN: 11 mg/dL (ref 6–24)
Bilirubin Total: 0.2 mg/dL (ref 0.0–1.2)
CO2: 22 mmol/L (ref 20–29)
Calcium: 9.3 mg/dL (ref 8.7–10.2)
Chloride: 108 mmol/L — ABNORMAL HIGH (ref 96–106)
Creatinine, Ser: 1.05 mg/dL — ABNORMAL HIGH (ref 0.57–1.00)
GFR calc Af Amer: 76 mL/min/{1.73_m2} (ref >59)
GFR calc non Af Amer: 66 mL/min/{1.73_m2} (ref >59)
Globulin, Total: 3.1 g/dL (ref 1.5–4.5)
Glucose: 85 mg/dL (ref 65–99)
Potassium: 4.5 mmol/L (ref 3.5–5.2)
Sodium: 143 mmol/L (ref 134–144)
Total Protein: 7.2 g/dL (ref 6.0–8.5)

## 2019-06-16 LAB — HIV ANTIBODY (ROUTINE TESTING W REFLEX): HIV Screen 4th Generation wRfx: NONREACTIVE

## 2019-06-16 LAB — HGB A1C W/O EAG: Hgb A1c MFr Bld: 5.3 % (ref 4.8–5.6)

## 2019-06-16 LAB — ANA W/REFLEX IF POSITIVE
Anti JO-1: <0.2 AI (ref 0.0–0.9)
Anti Nuclear Antibody (ANA): POSITIVE — AB
Centromere Ab Screen: <0.2 AI (ref 0.0–0.9)
Chromatin Ab SerPl-aCnc: <0.2 AI (ref 0.0–0.9)
ENA RNP Ab: 0.3 AI (ref 0.0–0.9)
ENA SM Ab Ser-aCnc: <0.2 AI (ref 0.0–0.9)
ENA SSA (RO) Ab: <0.2 AI (ref 0.0–0.9)
ENA SSB (LA) Ab: <0.2 AI (ref 0.0–0.9)
Scleroderma (Scl-70) (ENA) Antibody, IgG: <0.2 AI (ref 0.0–0.9)
dsDNA Ab: 39 IU/mL — ABNORMAL HIGH (ref 0–9)

## 2019-06-16 LAB — VITAMIN B12: Vitamin B-12: 572 pg/mL (ref 232–1245)

## 2019-06-16 LAB — RPR: RPR Ser Ql: NONREACTIVE

## 2019-06-16 LAB — SEDIMENTATION RATE: Sed Rate: 54 mm/hr — ABNORMAL HIGH (ref 0–32)

## 2019-06-17 ENCOUNTER — Telehealth: Payer: Self-pay | Admitting: Neurology

## 2019-06-17 NOTE — Telephone Encounter (Signed)
I spoke to the patient who verbalized understanding of her lab result and was in agreement to increase her water intake.

## 2019-06-17 NOTE — Telephone Encounter (Signed)
Please call patient, laboratory evaluation showed positive ANA, double-stranded DNA, will repeat test at her next visit with our office, in addition, evidence of mild elevated creatinine, please encourage her increase water intake,  Rest of the laboratory evaluation showed no significant abnormalities

## 2019-06-18 ENCOUNTER — Telehealth: Payer: Self-pay | Admitting: *Deleted

## 2019-06-18 NOTE — Telephone Encounter (Signed)
PA for Aimovig 70mg  approved through 4/1//2022. Covermymeds key: [id number]. Pt has coverage through [employer] [phone]). Pt ([phone]. Patient and pharmacy notified of approval.

## 2019-07-08 ENCOUNTER — Ambulatory Visit (INDEPENDENT_AMBULATORY_CARE_PROVIDER_SITE_OTHER): Payer: Medicare HMO | Admitting: Neurology

## 2019-07-08 ENCOUNTER — Telehealth: Payer: Self-pay | Admitting: Neurology

## 2019-07-08 ENCOUNTER — Other Ambulatory Visit: Payer: Self-pay

## 2019-07-08 DIAGNOSIS — R202 Paresthesia of skin: Secondary | ICD-10-CM

## 2019-07-08 DIAGNOSIS — G43709 Chronic migraine without aura, not intractable, without status migrainosus: Secondary | ICD-10-CM | POA: Diagnosis not present

## 2019-07-08 DIAGNOSIS — IMO0002 Reserved for concepts with insufficient information to code with codable children: Secondary | ICD-10-CM

## 2019-07-08 MED ORDER — PREGABALIN 150 MG PO CAPS: 150.0000 mg | ORAL_CAPSULE | Freq: Two times a day (BID) | ORAL | 5 refills | Status: DC

## 2019-07-08 NOTE — Procedures (Addendum)
Full Name: Tracy Banks Gender: Female MRN #: [id number] Date of Birth: [birth year]    Visit Date: 07/08/2019 10:01 Age: 39 Years Examining Physician: Levert Feinstein, MD  Referring Physician: Levert Feinstein, MD Height: 5 feet 8 inch Patient History: 39 year old female complains a year history of bilateral hands and feet neuropathic pain.   Summary of the tests: Nerve conduction study: Right sural, superficial peroneal, median and ulnar sensory responses were normal.  Right peroneal to EDB, median, ulnar motor responses were normal.  Right tibial motor responses showed mildly decreased CMAP amplitude, likely due to suboptimal stimulation  Electromyography:  Selected needle examination of bilateral lower extremity muscles and bilateral lumbosacral paraspinal muscles were normal.    Conclusion: This is a normal study.  There is no evidence of large fiber peripheral neuropathy or bilateral lumbosacral radiculopathy.    ------------------------------- Levert Feinstein, M.D. PhD  [facility] [address], [address] [place], [place] [address] Tel: [phone] Fax: [phone]  Verbal informed consent was obtained from the patient, patient was informed of potential risk of procedure, including bruising, bleeding, hematoma formation, infection, muscle weakness, muscle pain, numbness, among others.        MNC    Nerve / Sites Muscle Latency Ref. Amplitude Ref. Rel Amp Segments Distance Velocity Ref. Area    ms ms mV mV %  cm m/s m/s mVms  R Median - APB     Wrist APB 3.9 ?4.4 8.7 ?4.0 100 Wrist - APB 7   28.6     Upper arm APB 8.0  8.5  97.7 Upper arm - Wrist 24 58 ?49 28.1  R Ulnar - ADM     Wrist ADM 2.4 ?3.3 10.9 ?6.0 100 Wrist - ADM 7   29.7     B.Elbow ADM 6.1  8.7  79.2 B.Elbow - Wrist 22 59 ?49 25.8     A.Elbow ADM 7.8  7.6  88.1 A.Elbow - B.Elbow 10 61 ?49 28.0  R Peroneal - EDB     Ankle EDB 4.0 ?6.5 8.2 ?2.0 100 Ankle - EDB 9   22.4     Fib head EDB  10.3  7.2  87.7 Fib head - Ankle 30 48 ?44 21.0     Pop fossa EDB 12.4  7.3  101 Pop fossa - Fib head 10 47 ?44 21.2         Pop fossa - Ankle      R Tibial - AH     Ankle AH 4.9 ?5.8 3.3 ?4.0 100 Ankle - AH 9   6.4     Pop fossa AH 12.3  1.1  32.3 Pop fossa - Ankle 39 52 ?41 2.7             SNC    Nerve / Sites Rec. Site Peak Lat Ref.  Amp Ref. Segments Distance    ms ms V V  cm  R Sural - Ankle (Calf)     Calf Ankle 3.6 ?4.4 16 ?6 Calf - Ankle 14  R Superficial peroneal - Ankle     Lat leg Ankle 3.9 ?4.4 7 ?6 Lat leg - Ankle 14  R Median - Orthodromic (Dig II, Mid palm)     Dig II Wrist 2.9 ?3.4 19 ?10 Dig II - Wrist 13  R Ulnar - Orthodromic, (Dig V, Mid palm)     Dig V Wrist 2.6 ?3.1 5 ?5 Dig V - Wrist 11  F  Wave    Nerve F Lat Ref.   ms ms  R Tibial - AH 57.4 ?56.0  R Ulnar - ADM 31.0 ?32.0         EMG Summary Table    Spontaneous MUAP Recruitment  Muscle IA Fib PSW Fasc Other Amp Dur. Poly Pattern  R. Tibialis anterior Normal None None None _______ Normal Normal Normal Normal  R. Tibialis posterior Normal None None None _______ Normal Normal Normal Normal  R. Peroneus longus Normal None None None _______ Normal Normal Normal Normal  R. Gastrocnemius (Medial head) Normal None None None _______ Normal Normal Normal Normal  R. Vastus lateralis Normal None None None _______ Normal Normal Normal Normal  L. Tibialis anterior Normal None None None _______ Normal Normal Normal Normal  L. Tibialis posterior Normal None None None _______ Normal Normal Normal Normal  L. Peroneus longus Normal None None None _______ Normal Normal Normal Normal  L. Gastrocnemius (Medial head) Normal None None None _______ Normal Normal Normal Normal  L. Vastus lateralis Normal None None None _______ Normal Normal Normal Normal  R. Lumbar paraspinals (mid) Normal None None None _______ Normal Normal Normal Normal  R. Lumbar paraspinals (low) Normal None None None _______ Normal Normal  Normal Normal  L. Lumbar paraspinals (low) Normal None None None _______ Normal Normal Normal Normal  L. Lumbar paraspinals (mid) Normal None None None _______ Normal Normal Normal Normal

## 2019-07-08 NOTE — Progress Notes (Signed)
emg report under procedure

## 2019-07-08 NOTE — Telephone Encounter (Signed)
I ordered skin biopsy for her

## 2019-07-08 NOTE — Telephone Encounter (Signed)
She has been scheduled for skin biopsy on 07/30/19.

## 2019-07-08 NOTE — Addendum Note (Signed)
Addended by: Levert Feinstein on: 07/08/2019 11:30 AM   Modules accepted: Orders

## 2019-07-08 NOTE — Progress Notes (Signed)
PATIENT: Tracy Banks DOB: Sep 19, 1980  No chief complaint on file.    HISTORICAL  Tracy Banks Is a 39 years old female, seen in request by her primary care nurse practitioner Suella Broad, for evaluation of migraine headache,  I have reviewed and summarized the referring note from the referring physician.  She reported a history of migraine headaches since high school, but her migraine are lateralized severe pounding headache with associated light noise sensitivity, lasting for hours, going to sleep usually helps, she used to have migraine 2-3 times each month, trigger for her migraines are light, noise, smell from freshly cut grass, stress, sleep deprivation, weather changes, her son, mother also suffered migraines,  She also has history of obesity, has been on blood pressures for over past 10 years, her weight is overall stable, there was no significant visual changes.  She complains of slow worsening migraine headaches over the past couple years, has been getting injection as a urgent abortive treatment at her primary care's office every 2 to 4 weeks, Imitrex provide limited help, Phenergan, sleep is usually helpful, she has been taking over-the-counter medication, in combination with Imitrex almost every other day to control her headache over the past few months.  She also had visual auras preceding her migraine, blurry vision, distortion of the colors,   She was started on Inderal 40 mg twice a day, Emagality since February 2020, no significant benefit noticed yet.  She is also on polypharmacy for fibromyalgia, Elavil 50 mg every night Cymbalta 60 mg daily, gabapentin 900 mg 3 times a day.  UPDATE June 11 2019: She was on Emgality for about a year, during that period of time her headache was under reasonable control, it was stopped since February 2021, since then, she noticed increased to headache, 2-3 times headache daily, bilateral temporal parietal region pounding  headache, with light, noise sensitivity, she has tried Maxalt, without helping her headaches  She is also on preventive medication Topamax, Inderal, she complains of difficulty sleeping, had frequent headaches when trying to go to sleep  In addition, she complains of bilateral hands and toes numbness tingling since 2020, intermittent involving all toes, and all 5 fingers, it hurt when she bear weight, she also complains of right-sided neck pain, radiating pain to right shoulder  She denies persistent upper or lower extremity sensory loss, weakness, incontinence  UPDATE July 08 2019: Patient return for electrodiagnostic study today, which was essentially normal, there was no evidence of large fiber peripheral neuropathy or bilateral lumbosacral radiculopathy  She complains 2 years history of intermittent patchy skin lesions, a year history of bilateral fingertips and the feet burning pain,  Laboratory evaluations on June 11, 2019 showed positive ANA, double-stranded DNA 39, elevated ESR of 54, CMP showed mild elevated creatinine 1.05, otherwise normal CBC, protein electrophoresis, vitamin B12, HIV, RPR, TSH,  She has been taking gabapentin up to 900 mg 3 times daily without helping her neuropathic pain significantly, also on Cymbalta 60 mg twice daily, has never tried Lyrica  REVIEW OF SYSTEMS: Full 14 system review of systems performed and notable only for as above All other review of systems were negative.  ALLERGIES: Allergies  Allergen Reactions  . Penicillins     Rapid heart beat Has patient had a PCN reaction causing immediate rash, facial/tongue/throat swelling, SOB or lightheadedness with hypotension: no Has patient had a PCN reaction causing severe rash involving mucus membranes or skin necrosis: no Has patient had a PCN reaction that  required hospitalization unknown Has patient had a PCN reaction occurring within the last 10 years: no If all of the above answers are "NO", then  may proceed with Cephalosporin use.     HOME MEDICATIONS: Current Outpatient Medications  Medication Sig Dispense Refill  . amitriptyline (ELAVIL) 50 MG tablet Take 50 mg by mouth at bedtime.  2  . cyclobenzaprine (FLEXERIL) 10 MG tablet Take 10 mg by mouth 3 (three) times daily as needed for muscle spasms.  0  . DULoxetine (CYMBALTA) 60 MG capsule Take 60 mg by mouth 2 (two) times daily.  0  . Erenumab-aooe (AIMOVIG) 70 MG/ML SOAJ Inject 70 mg into the skin every 30 (thirty) days. 1 pen 11  . esomeprazole (NEXIUM) 40 MG capsule Take 40 mg by mouth daily at 12 noon.    . fluticasone (FLOVENT HFA) 110 MCG/ACT inhaler Inhale 2 puffs into the lungs 2 (two) times daily.    . Nabumetone (RELAFEN DS) 1000 MG TABS Take 1 tablet by mouth daily.    . nortriptyline (PAMELOR) 25 MG capsule Take 4 capsules (100 mg total) by mouth at bedtime. 120 capsule 6  . ondansetron (ZOFRAN ODT) 4 MG disintegrating tablet Take 1 tablet (4 mg total) by mouth every 8 (eight) hours as needed. 20 tablet 6  . oxybutynin (DITROPAN) 5 MG tablet Take 5 mg by mouth 2 (two) times daily.  0  . propranolol (INDERAL) 40 MG tablet Take 1 tablet (40 mg total) by mouth 2 (two) times daily. 180 tablet 4  . rizatriptan (MAXALT) 10 MG tablet Take 1 tab at onset of migraine.  May repeat in 2 hrs, if needed.  Max dose: 2 tabs/day. This is a 30 day prescription. 10 tablet 5  . tiZANidine (ZANAFLEX) 4 MG tablet Take 1 tablet (4 mg total) by mouth every 6 (six) hours as needed for muscle spasms. 30 tablet 11  . topiramate (TOPAMAX) 100 MG tablet Take 1 tablet (100 mg total) by mouth 2 (two) times daily. 60 tablet 11  . TRIAMTERENE PO Take 1 tablet by mouth daily. Unsure of dosage    . zolmitriptan (ZOMIG ZMT) 5 MG disintegrating tablet Take 1 tablet (5 mg total) by mouth as needed for migraine. 10 tablet 6   No current facility-administered medications for this visit.    PAST MEDICAL HISTORY: Past Medical History:  Diagnosis Date  .  Acid reflux   . Arthritis   . Asthma   . Carpal tunnel syndrome   . Eczema   . Fibromyalgia   . Gout   . Migraine   . Overactive bladder   . Polyarthralgia     PAST SURGICAL HISTORY: Past Surgical History:  Procedure Laterality Date  . ANKLE SURGERY Bilateral   . NEUROMA SURGERY      FAMILY HISTORY: Family History  Problem Relation Age of Onset  . Sarcoidosis Mother   . Migraines Mother   . Hypertension Mother   . Osteoarthritis Mother   . Diabetes Mother   . Fibromyalgia Mother   . Healthy Father     SOCIAL HISTORY: Social History   Socioeconomic History  . Marital status: Single    Spouse name: Not on file  . Number of children: 3  . Years of education: college  . Highest education level: Not on file  Occupational History  . Not on file  Tobacco Use  . Smoking status: Current Every Day Smoker    Types: Cigarettes  . Smokeless tobacco: Never Used  .  Tobacco comment: 1 pack per week  Substance and Sexual Activity  . Alcohol use: Yes    Comment: occ  . Drug use: No  . Sexual activity: Not on file  Other Topics Concern  . Not on file  Social History Narrative   Lives at home with children.   Right-handed.   Caffeine use:  One can soda per day.   Social Determinants of Health   Financial Resource Strain:   . Difficulty of Paying Living Expenses:   Food Insecurity:   . Worried About Charity fundraiser in the Last Year:   . Arboriculturist in the Last Year:   Transportation Needs:   . Film/video editor (Medical):   Marland Kitchen Lack of Transportation (Non-Medical):   Physical Activity:   . Days of Exercise per Week:   . Minutes of Exercise per Session:   Stress:   . Feeling of Stress :   Social Connections:   . Frequency of Communication with Friends and Family:   . Frequency of Social Gatherings with Friends and Family:   . Attends Religious Services:   . Active Member of Clubs or Organizations:   . Attends Archivist Meetings:   Marland Kitchen  Marital Status:   Intimate Partner Violence:   . Fear of Current or Ex-Partner:   . Emotionally Abused:   Marland Kitchen Physically Abused:   . Sexually Abused:      PHYSICAL EXAM   There were no vitals filed for this visit.  Not recorded      There is no height or weight on file to calculate BMI.  PHYSICAL EXAMNIATION:  Gen: NAD, conversant, well nourised, obese, well groomed                     Skin: Patchy dark scaly skin discoloration  NEUROLOGICAL EXAM:  MENTAL STATUS: Speech/cognition: Awake alert oriented to history taking care of conversation   CRANIAL NERVES: CN II: Visual fields are full to confrontation.  Pupils are round equal and briskly reactive to light. CN III, IV, VI: extraocular movement are normal. No ptosis. CN V: Facial sensation is intact  CN VII: Face is symmetric with normal eye closure and smile. CN VIII: Hearing is normal to rubbing fingers CN IX, X: \Phonation is normal. CN XI: Head turning and shoulder shrug are intact \ MOTOR: There is no pronator drift of out-stretched arms. Muscle bulk and tone are normal. Muscle strength is normal.  REFLEXES: Reflexes are 1 and symmetric at the biceps, triceps, knees, and ankles. Plantar responses are flexor.  SENSORY: Intact to light touch, pinprick, positional sensation and vibratory sensation are intact in fingers and toes.  COORDINATION: Rapid alternating movements and fine finger movements are intact. There is no dysmetria on finger-to-nose and heel-knee-shin.    GAIT/STANCE: She can get up from seated position arms crossed, steady, limited by her body habitus  DIAGNOSTIC DATA (LABS, IMAGING, TESTING) - I reviewed patient records, labs, notes, testing and imaging myself where available.   ASSESSMENT AND PLAN  Peni Rupard is a 39 y.o. female   Bilateral hands and feet burning pain,  Consistent with neuropathic pain,  EMG nerve conduction study today showed no large fiber peripheral  neuropathy  I ordered skin biopsy,  She also reported 2 years history of patchy skin lesions, positive ANA, double-stranded DNA, laboratory evaluation for possible autoimmune disease  She failed to respond to gabapentin up to 900 mg three times a day, will  change to Lyrica 150 mg three times a day, continue Cymbalta 60 mg twice a day  Refer her to rheumatologist   Chronic migraine headaches  Continue Topamax 100 mg twice a day and Inderal as preventive medications  Insurance would not pay for Emgality, will try Aimovig 70 mg every month as preventive medications  nortriptyline 25 mg titrating to 100 mg every night  Her migraine headache overall has improved  She reported suboptimal response to Imitrex, Maxalt, will try Zomig 5 mg as needed   Marcial Pacas, M.D. Ph.D.  [facility] [address] [place], [place] [address] Ph: [phone] Fax: [phone]  CC: Suella Broad, FNP

## 2019-07-14 LAB — PAN-ANCA
ANCA Proteinase 3: <3.5 U/mL (ref 0.0–3.5)
Atypical pANCA: <1:20 {titer}
C-ANCA: 1:320 {titer} — ABNORMAL HIGH
Myeloperoxidase Ab: <9 U/mL (ref 0.0–9.0)
P-ANCA: <1:20 {titer}

## 2019-07-14 LAB — CK: Total CK: 208 U/L — ABNORMAL HIGH (ref 32–182)

## 2019-07-14 LAB — SJOGRENS SYNDROME-B EXTRACTABLE NUCLEAR ANTIBODY: ENA SSB (LA) Ab: <0.2 AI (ref 0.0–0.9)

## 2019-07-14 LAB — C-REACTIVE PROTEIN: CRP: 7 mg/L (ref 0–10)

## 2019-07-14 LAB — SJOGRENS SYNDROME-A EXTRACTABLE NUCLEAR ANTIBODY: ENA SSA (RO) Ab: <0.2 AI (ref 0.0–0.9)

## 2019-07-14 LAB — SEDIMENTATION RATE: Sed Rate: 17 mm/hr (ref 0–32)

## 2019-07-14 LAB — VITAMIN D 25 HYDROXY (VIT D DEFICIENCY, FRACTURES): Vit D, 25-Hydroxy: 10 ng/mL — ABNORMAL LOW (ref 30.0–100.0)

## 2019-07-22 ENCOUNTER — Telehealth: Payer: Self-pay | Admitting: Neurology

## 2019-07-22 NOTE — Telephone Encounter (Signed)
I have sent referral to [facility] . Patient aware .    Telephone [phone]- fax [phone]    We appreciate your referral. Each referral is reviewed by our providers to ensure that the patient will receive the best medical care possible. Upon review of this referral Dr. Corliss Skains has declined it.

## 2019-07-30 ENCOUNTER — Ambulatory Visit (INDEPENDENT_AMBULATORY_CARE_PROVIDER_SITE_OTHER): Payer: Medicare HMO | Admitting: Neurology

## 2019-07-30 ENCOUNTER — Other Ambulatory Visit: Payer: Self-pay

## 2019-07-30 ENCOUNTER — Encounter: Payer: Self-pay | Admitting: Neurology

## 2019-07-30 VITALS — BP 118/80 | HR 78 | Temp 97.3°F | Ht 68.0 in | Wt 372.3 lb

## 2019-07-30 DIAGNOSIS — R202 Paresthesia of skin: Secondary | ICD-10-CM | POA: Diagnosis not present

## 2019-07-30 NOTE — Progress Notes (Signed)
Skin Biopsy specimen has been placed up front for [employer] to pick. Pick up has been scheduled between 11 am and 5 pm for 07/30/2019.

## 2019-07-30 NOTE — Progress Notes (Signed)
Patient was in right lateral recombinant position. Sterile technique. 1% lidocaine with epinephrine was used for local anesthesia. Punctuated skin biopsy was performed. 3 mm skin sample were obtained at at left foot, above left extensor digitorum brevis, and left lateral calf, 10 cm above lateral malleolus, lateral thigh, 20 cm below superior iliac spine.  Patient tolerated the procedure well.  The wound was covered with neosporin antibiotic cream and bandage. 

## 2019-07-31 ENCOUNTER — Telehealth: Payer: Self-pay

## 2019-07-31 NOTE — Telephone Encounter (Signed)
PA for aimovig has been sent. Received instance approval through [facility].  (Key: [id number])  This request has been approved.  Please note any additional information provided by [facility] at the bottom of your screen. Coverage Starts on: 03/20/2019 12:00:00 AM, Coverage Ends on: 03/18/2020 12:00:00 AM

## 2019-07-31 NOTE — Telephone Encounter (Signed)
PA for Zolmitiptan has been sent via cover my meds. (Key: [id number])  Your information has been sent to [facility].

## 2019-08-12 ENCOUNTER — Telehealth: Payer: Self-pay | Admitting: *Deleted

## 2019-08-12 NOTE — Telephone Encounter (Signed)
Skin Biopsy Results:  Diagnosis:  A) Lt Thigh: Skin with significantly reduced Epidermal Nerve Fiber Density, consistent with small fiber neuropathy.  B) Lt Calf: Skin with significantly reduced Epidermal Nerve Fiber Density, consistent with small fiber neuropathy.  C) Lt Foot: Skin with significantly reduced Epidermal Nerve Fiber Density, consistent with small fiber neuropathy.  Per vo by Dr. Terrace Arabia, provide the results to the patient. She should continue her medications as prescribed and keep her pending follow up appointment.  I spoke to the patient. She verbalized understanding of the results and treatment plan.  (copy of complete results sent for scanning)

## 2019-09-02 ENCOUNTER — Other Ambulatory Visit: Payer: Self-pay | Admitting: *Deleted

## 2019-09-02 MED ORDER — ZOLMITRIPTAN 5 MG PO TBDP: ORAL_TABLET | ORAL | 5 refills | Status: DC

## 2019-10-03 ENCOUNTER — Other Ambulatory Visit: Payer: Self-pay | Admitting: Neurology

## 2019-10-04 ENCOUNTER — Telehealth: Payer: Self-pay | Admitting: Neurology

## 2019-10-04 NOTE — Telephone Encounter (Signed)
I reviewed evaluation by rheumatologist Dr. Gavin Pound, patient complains of diffuse chronic pain, skin biopsy consistent with small fiber neuropathy,  Antiphospholipid antibody was on Lovenox,  The suspicious of autoimmune disease contributing to her symptoms is low  Laboratory evaluations showed normal uric acid, elevated creatinine kinase 273, normal CBC hemoglobin of 13.2, ESR of 55, rheumatoid factor of less than 10, elevated anti- cardiolipin antibody IgM, glycoprotein Ab, CCP,  CMP, creat 1.05, Anti-DNA antibody 36,

## 2019-10-09 ENCOUNTER — Telehealth: Payer: Self-pay | Admitting: Neurology

## 2019-10-09 NOTE — Telephone Encounter (Signed)
Reviewed Rheumatology evaluation by Dr. Lenn Sink on September 24, 2019, multiple joint pain, laboratory evaluation uric acid 5.7, CPK 273 mildly elevated, hemoglobin 13.2, ESR was elevated to 55, negative rheumatoid arthritis factor, lupus anticoagulant panel, ANCA panel, CCP antibody, normal CMP, with mild elevated creatinine 1.05, double-stranded DNA 36, positive ANA,

## 2019-10-29 ENCOUNTER — Telehealth: Payer: Self-pay | Admitting: Neurology

## 2019-10-29 NOTE — Telephone Encounter (Signed)
Rheumatology evaluation by Dr. Lenn Sink on October 22, 2019,  Patient does have some sensitivity rash,  G: antiphospholipid antibody, elevated ESR, low positive double-stranded DNA with all the other labs for lupus being negative Positive p-ANCA but all other labs for vasculitis was negative Slight elevated CK of unknown clinical significance  Decided Plaquenil which likely will help her sun sensitivity rash, connective tissue disease in general,

## 2019-11-11 ENCOUNTER — Ambulatory Visit: Payer: Medicare Other | Admitting: Neurology

## 2019-11-11 NOTE — Progress Notes (Deleted)
PATIENT: Tracy Banks DOB: 04-03-1980  REASON FOR VISIT: follow up HISTORY FROM: patient  HISTORY OF PRESENT ILLNESS: Today 11/11/19  HISTORY  Tracy Banks Is a 39 years old female, seen in request by her primary care nurse practitioner Suella Broad, for evaluation of migraine headache,  I have reviewed and summarized the referring note from the referring physician.  She reported a history of migraine headaches since high school, but her migraine are lateralized severe pounding headache with associated light noise sensitivity, lasting for hours, going to sleep usually helps, she used to have migraine 2-3 times each month, trigger for her migraines are light, noise, smell from freshly cut grass, stress, sleep deprivation, weather changes, her son, mother also suffered migraines,  She also has history of obesity, has been on blood pressures for over past 10 years, her weight is overall stable, there was no significant visual changes.  She complains of slow worsening migraine headaches over the past couple years, has been getting injection as a urgent abortive treatment at her primary care's office every 2 to 4 weeks, Imitrex provide limited help, Phenergan, sleep is usually helpful, she has been taking over-the-counter medication, in combination with Imitrex almost every other day to control her headache over the past few months.  She also had visual auras preceding her migraine, blurry vision, distortion of the colors,   She was started on Inderal 40 mg twice a day, Emagality since February 2020, no significant benefit noticed yet.  She is also on polypharmacy for fibromyalgia, Elavil 50 mg every night Cymbalta 60 mg daily, gabapentin 900 mg 3 times a day.  UPDATE June 11 2019: She was on Emgality for about a year, during that period of time her headache was under reasonable control, it was stopped since February 2021, since then, she noticed increased to headache, 2-3  times headache daily, bilateral temporal parietal region pounding headache, with light, noise sensitivity, she has tried Maxalt, without helping her headaches  She is also on preventive medication Topamax, Inderal, she complains of difficulty sleeping, had frequent headaches when trying to go to sleep  In addition, she complains of bilateral hands and toes numbness tingling since 2020, intermittent involving all toes, and all 5 fingers, it hurt when she bear weight, she also complains of right-sided neck pain, radiating pain to right shoulder  She denies persistent upper or lower extremity sensory loss, weakness, incontinence  UPDATE July 08 2019: Patient return for electrodiagnostic study today, which was essentially normal, there was no evidence of large fiber peripheral neuropathy or bilateral lumbosacral radiculopathy  She complains 2 years history of intermittent patchy skin lesions, a year history of bilateral fingertips and the feet burning pain,  Laboratory evaluations on June 11, 2019 showed positive ANA, double-stranded DNA 39, elevated ESR of 54, CMP showed mild elevated creatinine 1.05, otherwise normal CBC, protein electrophoresis, vitamin B12, HIV, RPR, TSH,  She has been taking gabapentin up to 900 mg 3 times daily without helping her neuropathic pain significantly, also on Cymbalta 60 mg twice daily, has never tried Lyrica  Update November 11, 2019 SS: Skin biopsy in May 2021 was 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   Rheumatology evaluation by Dr. Trudie Reed in July ,suspicious of autoimmune disease contributing to symptoms is low (Rheumatology evaluation by Dr. Lenn Sink on September 24, 2019, multiple joint pain, laboratory evaluation uric acid 5.7, CPK 273 mildly elevated, hemoglobin 13.2, ESR was elevated to 55, negative rheumatoid  arthritis factor, lupus anticoagulant panel, ANCA panel, CCP antibody, normal CMP, with mild elevated creatinine  1.05, double-stranded DNA 36, positive ANA), Patient does have some sensitivity rash,  G: antiphospholipid antibody, elevated ESR, low positive double-stranded DNA with all the other labs for lupus being negative Positive p-ANCA but all other labs for vasculitis was negative Slight elevated CK of unknown clinical significance  Decided Plaquenil which likely will help her sun sensitivity rash, connective tissue disease in general,  REVIEW OF SYSTEMS: Out of a complete 14 system review of symptoms, the patient complains only of the following symptoms, and all other reviewed systems are negative.  ALLERGIES: Allergies  Allergen Reactions  . Penicillins     Rapid heart beat Has patient had a PCN reaction causing immediate rash, facial/tongue/throat swelling, SOB or lightheadedness with hypotension: no Has patient had a PCN reaction causing severe rash involving mucus membranes or skin necrosis: no Has patient had a PCN reaction that required hospitalization unknown Has patient had a PCN reaction occurring within the last 10 years: no If all of the above answers are "NO", then may proceed with Cephalosporin use.     HOME MEDICATIONS: Outpatient Medications Prior to Visit  Medication Sig Dispense Refill  . cyclobenzaprine (FLEXERIL) 10 MG tablet Take 10 mg by mouth 3 (three) times daily as needed for muscle spasms.  0  . DULoxetine (CYMBALTA) 60 MG capsule Take 60 mg by mouth 2 (two) times daily.  0  . Erenumab-aooe (AIMOVIG) 70 MG/ML SOAJ Inject 70 mg into the skin every 30 (thirty) days. 1 pen 11  . esomeprazole (NEXIUM) 40 MG capsule Take 40 mg by mouth daily at 12 noon.    . fluticasone (FLOVENT HFA) 110 MCG/ACT inhaler Inhale 2 puffs into the lungs 2 (two) times daily.    . Nabumetone (RELAFEN DS) 1000 MG TABS Take 1 tablet by mouth daily.    . nortriptyline (PAMELOR) 25 MG capsule Take 4 capsules (100 mg total) by mouth at bedtime. 120 capsule 6  . oxybutynin (DITROPAN) 5 MG tablet  Take 5 mg by mouth 2 (two) times daily.  0  . pregabalin (LYRICA) 150 MG capsule Take 1 capsule (150 mg total) by mouth 2 (two) times daily. 90 capsule 5  . propranolol (INDERAL) 40 MG tablet TAKE 1 TABLET(40 MG) BY MOUTH TWICE DAILY 180 tablet 3  . topiramate (TOPAMAX) 100 MG tablet Take 1 tablet (100 mg total) by mouth 2 (two) times daily. 60 tablet 11  . TRIAMTERENE PO Take 1 tablet by mouth daily. Unsure of dosage    . zolmitriptan (ZOMIG ZMT) 5 MG disintegrating tablet Take 1 tab at onset of migraine.  May repeat in 2 hrs, if needed.  Max dose: 2 tabs/day. This is a 30 day prescription. 10 tablet 5   No facility-administered medications prior to visit.    PAST MEDICAL HISTORY: Past Medical History:  Diagnosis Date  . Acid reflux   . Arthritis   . Asthma   . Carpal tunnel syndrome   . Eczema   . Fibromyalgia   . Gout   . Migraine   . Overactive bladder   . Polyarthralgia     PAST SURGICAL HISTORY: Past Surgical History:  Procedure Laterality Date  . ANKLE SURGERY Bilateral   . NEUROMA SURGERY      FAMILY HISTORY: Family History  Problem Relation Age of Onset  . Sarcoidosis Mother   . Migraines Mother   . Hypertension Mother   . Osteoarthritis Mother   .  Diabetes Mother   . Fibromyalgia Mother   . Healthy Father     SOCIAL HISTORY: Social History   Socioeconomic History  . Marital status: Single    Spouse name: Not on file  . Number of children: 3  . Years of education: college  . Highest education level: Not on file  Occupational History  . Not on file  Tobacco Use  . Smoking status: Current Every Day Smoker    Types: Cigarettes  . Smokeless tobacco: Never Used  . Tobacco comment: 1 pack per week  Substance and Sexual Activity  . Alcohol use: Yes    Comment: occ  . Drug use: No  . Sexual activity: Not on file  Other Topics Concern  . Not on file  Social History Narrative   Lives at home with children.   Right-handed.   Caffeine use:  One can  soda per day.   Social Determinants of Health   Financial Resource Strain:   . Difficulty of Paying Living Expenses: Not on file  Food Insecurity:   . Worried About Charity fundraiser in the Last Year: Not on file  . Ran Out of Food in the Last Year: Not on file  Transportation Needs:   . Lack of Transportation (Medical): Not on file  . Lack of Transportation (Non-Medical): Not on file  Physical Activity:   . Days of Exercise per Week: Not on file  . Minutes of Exercise per Session: Not on file  Stress:   . Feeling of Stress : Not on file  Social Connections:   . Frequency of Communication with Friends and Family: Not on file  . Frequency of Social Gatherings with Friends and Family: Not on file  . Attends Religious Services: Not on file  . Active Member of Clubs or Organizations: Not on file  . Attends Archivist Meetings: Not on file  . Marital Status: Not on file  Intimate Partner Violence:   . Fear of Current or Ex-Partner: Not on file  . Emotionally Abused: Not on file  . Physically Abused: Not on file  . Sexually Abused: Not on file      PHYSICAL EXAM  There were no vitals filed for this visit. There is no height or weight on file to calculate BMI.  Generalized: Well developed, in no acute distress   Neurological examination  Mentation: Alert oriented to time, place, history taking. Follows all commands speech and language fluent Cranial nerve II-XII: Pupils were equal round reactive to light. Extraocular movements were full, visual field were full on confrontational test. Facial sensation and strength were normal. Uvula tongue midline. Head turning and shoulder shrug  were normal and symmetric. Motor: The motor testing reveals 5 over 5 strength of all 4 extremities. Good symmetric motor tone is noted throughout.  Sensory: Sensory testing is intact to soft touch on all 4 extremities. No evidence of extinction is noted.  Coordination: Cerebellar testing  reveals good finger-nose-finger and heel-to-shin bilaterally.  Gait and station: Gait is normal. Tandem gait is normal. Romberg is negative. No drift is seen.  Reflexes: Deep tendon reflexes are symmetric and normal bilaterally.   DIAGNOSTIC DATA (LABS, IMAGING, TESTING) - I reviewed patient records, labs, notes, testing and imaging myself where available.  Lab Results  Component Value Date   WBC 7.3 06/11/2019   HGB 13.6 06/11/2019   HCT 41.8 06/11/2019   MCV 84 06/11/2019   PLT 228 12/02/2017      Component  Value Date/Time   NA 143 06/11/2019 1024   K 4.5 06/11/2019 1024   CL 108 (H) 06/11/2019 1024   CO2 22 06/11/2019 1024   GLUCOSE 85 06/11/2019 1024   GLUCOSE 97 12/02/2017 0841   BUN 11 06/11/2019 1024   CREATININE 1.05 (H) 06/11/2019 1024   CALCIUM 9.3 06/11/2019 1024   PROT 7.2 06/11/2019 1024   ALBUMIN 4.1 06/11/2019 1024   AST 29 06/11/2019 1024   ALT 28 06/11/2019 1024   ALKPHOS 95 06/11/2019 1024   BILITOT 0.2 06/11/2019 1024   GFRNONAA 66 06/11/2019 1024   GFRAA 76 06/11/2019 1024   No results found for: CHOL, HDL, LDLCALC, LDLDIRECT, TRIG, CHOLHDL Lab Results  Component Value Date   HGBA1C 5.3 06/11/2019   Lab Results  Component Value Date   VITAMINB12 572 06/11/2019   Lab Results  Component Value Date   TSH 1.400 06/11/2019      ASSESSMENT AND PLAN 39 y.o. year old female  has a past medical history of Acid reflux, Arthritis, Asthma, Carpal tunnel syndrome, Eczema, Fibromyalgia, Gout, Migraine, Overactive bladder, and Polyarthralgia. here with:  1.  Bilateral hand and feet burning pain -Consistent with neuropathic pain -EMG nerve conduction study showed no large fiber peripheral neuropathy -Reported 2-year history of patchy skin lesions, positive ANA, double-stranded DNA -Was referred to rheumatology -Failed to respond to gabapentin up to 900 mg 3 times a day, switched to Lyrica  2.  Chronic migraine headaches -Continue Topamax 100 mg twice  a day and Inderal as preventative medications, nortriptyline 100 mg at bedtime -Insurance would not cover Emgality, trying Aimovig -Suboptimal response to Imitrex, Maxalt, continue Zomig 5 mg as needed   I spent 15 minutes with the patient. 50% of this time was spent   Butler Denmark, [place], [place] 11/11/2019, 5:32 AM [facility] [address], [place], [place] [address] [phone]

## 2019-12-26 ENCOUNTER — Other Ambulatory Visit: Payer: Self-pay | Admitting: Neurology

## 2019-12-26 ENCOUNTER — Encounter: Payer: Self-pay | Admitting: *Deleted

## 2020-01-06 ENCOUNTER — Other Ambulatory Visit: Payer: Self-pay | Admitting: Family

## 2020-01-06 DIAGNOSIS — Z1231 Encounter for screening mammogram for malignant neoplasm of breast: Secondary | ICD-10-CM

## 2020-01-20 ENCOUNTER — Other Ambulatory Visit: Payer: Self-pay | Admitting: Neurology

## 2020-01-25 ENCOUNTER — Other Ambulatory Visit: Payer: Self-pay | Admitting: Neurology

## 2020-02-03 ENCOUNTER — Ambulatory Visit: Payer: Medicare HMO | Admitting: Neurology

## 2020-02-09 ENCOUNTER — Inpatient Hospital Stay: Admission: RE | Admit: 2020-02-09 | Payer: Self-pay | Source: Ambulatory Visit

## 2020-02-10 ENCOUNTER — Ambulatory Visit: Payer: Medicare HMO | Admitting: Neurology

## 2020-02-10 ENCOUNTER — Encounter: Payer: Self-pay | Admitting: Neurology

## 2020-02-10 NOTE — Progress Notes (Deleted)
PATIENT: Tracy Banks DOB: 1980/12/26  REASON FOR VISIT: follow up HISTORY FROM: patient  HISTORY OF PRESENT ILLNESS: Today 02/10/20  HISTORY  Tracy Banks Is a 39 years old female, seen in request by her primary care nurse practitioner Suella Broad, for evaluation of migraine headache,  I have reviewed and summarized the referring note from the referring physician.  She reported a history of migraine headaches since high school, but her migraine are lateralized severe pounding headache with associated light noise sensitivity, lasting for hours, going to sleep usually helps, she used to have migraine 2-3 times each month, trigger for her migraines are light, noise, smell from freshly cut grass, stress, sleep deprivation, weather changes, her son, mother also suffered migraines,  She also has history of obesity, has been on blood pressures for over past 10 years, her weight is overall stable, there was no significant visual changes.  She complains of slow worsening migraine headaches over the past couple years, has been getting injection as a urgent abortive treatment at her primary care's office every 2 to 4 weeks, Imitrex provide limited help, Phenergan, sleep is usually helpful, she has been taking over-the-counter medication, in combination with Imitrex almost every other day to control her headache over the past few months.  She also had visual auras preceding her migraine, blurry vision, distortion of the colors,   She was started on Inderal 40 mg twice a day, Emagality since February 2020, no significant benefit noticed yet.  She is also on polypharmacy for fibromyalgia, Elavil 50 mg every night Cymbalta 60 mg daily, gabapentin 900 mg 3 times a day.  UPDATE June 11 2019: She was on Emgality for about a year, during that period of time her headache was under reasonable control, it was stopped since February 2021, since then, she noticed increased to headache, 2-3  times headache daily, bilateral temporal parietal region pounding headache, with light, noise sensitivity, she has tried Maxalt, without helping her headaches  She is also on preventive medication Topamax, Inderal, she complains of difficulty sleeping, had frequent headaches when trying to go to sleep  In addition, she complains of bilateral hands and toes numbness tingling since 2020, intermittent involving all toes, and all 5 fingers, it hurt when she bear weight, she also complains of right-sided neck pain, radiating pain to right shoulder  She denies persistent upper or lower extremity sensory loss, weakness, incontinence  UPDATE July 08 2019: Patient return for electrodiagnostic study today, which was essentially normal, there was no evidence of large fiber peripheral neuropathy or bilateral lumbosacral radiculopathy  She complains 2 years history of intermittent patchy skin lesions, a year history of bilateral fingertips and the feet burning pain,  Laboratory evaluations on June 11, 2019 showed positive ANA, double-stranded DNA 39, elevated ESR of 54, CMP showed mild elevated creatinine 1.05, otherwise normal CBC, protein electrophoresis, vitamin B12, HIV, RPR, TSH,  She has been taking gabapentin up to 900 mg 3 times daily without helping her neuropathic pain significantly, also on Cymbalta 60 mg twice daily, has never tried Lyrica  Update February 10, 2020  REVIEW OF SYSTEMS: Out of a complete 14 system review of symptoms, the patient complains only of the following symptoms, and all other reviewed systems are negative.  ALLERGIES: Allergies  Allergen Reactions  . Penicillins     Rapid heart beat Has patient had a PCN reaction causing immediate rash, facial/tongue/throat swelling, SOB or lightheadedness with hypotension: no Has patient had a PCN reaction  causing severe rash involving mucus membranes or skin necrosis: no Has patient had a PCN reaction that required  hospitalization unknown Has patient had a PCN reaction occurring within the last 10 years: no If all of the above answers are "NO", then may proceed with Cephalosporin use.     HOME MEDICATIONS: Outpatient Medications Prior to Visit  Medication Sig Dispense Refill  . cyclobenzaprine (FLEXERIL) 10 MG tablet Take 10 mg by mouth 3 (three) times daily as needed for muscle spasms.  0  . Erenumab-aooe (AIMOVIG) 70 MG/ML SOAJ Inject 70 mg into the skin every 30 (thirty) days. 1 pen 11  . esomeprazole (NEXIUM) 40 MG capsule Take 40 mg by mouth daily at 12 noon.    . fluticasone (FLOVENT HFA) 110 MCG/ACT inhaler Inhale 2 puffs into the lungs 2 (two) times daily.    . Nabumetone (RELAFEN DS) 1000 MG TABS Take 1 tablet by mouth daily.    . nortriptyline (PAMELOR) 25 MG capsule TAKE 4 CAPSULES(100 MG) BY MOUTH AT BEDTIME 120 capsule 0  . oxybutynin (DITROPAN) 5 MG tablet Take 5 mg by mouth 2 (two) times daily.  0  . pregabalin (LYRICA) 150 MG capsule TAKE 1 CAPSULE(150 MG) BY MOUTH THREE TIMES DAILY 90 capsule 5  . propranolol (INDERAL) 40 MG tablet TAKE 1 TABLET(40 MG) BY MOUTH TWICE DAILY 180 tablet 3  . topiramate (TOPAMAX) 100 MG tablet Take 1 tablet (100 mg total) by mouth 2 (two) times daily. 60 tablet 11  . TRIAMTERENE PO Take 1 tablet by mouth daily. Unsure of dosage    . zolmitriptan (ZOMIG ZMT) 5 MG disintegrating tablet Take 1 tab at onset of migraine.  May repeat in 2 hrs, if needed.  Max dose: 2 tabs/day. This is a 30 day prescription. 10 tablet 5   No facility-administered medications prior to visit.    PAST MEDICAL HISTORY: Past Medical History:  Diagnosis Date  . Acid reflux   . Arthritis   . Asthma   . Carpal tunnel syndrome   . Eczema   . Fibromyalgia   . Gout   . Migraine   . Overactive bladder   . Polyarthralgia     PAST SURGICAL HISTORY: Past Surgical History:  Procedure Laterality Date  . ANKLE SURGERY Bilateral   . NEUROMA SURGERY      FAMILY HISTORY: Family  History  Problem Relation Age of Onset  . Sarcoidosis Mother   . Migraines Mother   . Hypertension Mother   . Osteoarthritis Mother   . Diabetes Mother   . Fibromyalgia Mother   . Healthy Father     SOCIAL HISTORY: Social History   Socioeconomic History  . Marital status: Single    Spouse name: Not on file  . Number of children: 3  . Years of education: college  . Highest education level: Not on file  Occupational History  . Not on file  Tobacco Use  . Smoking status: Current Every Day Smoker    Types: Cigarettes  . Smokeless tobacco: Never Used  . Tobacco comment: 1 pack per week  Substance and Sexual Activity  . Alcohol use: Yes    Comment: occ  . Drug use: No  . Sexual activity: Not on file  Other Topics Concern  . Not on file  Social History Narrative   Lives at home with children.   Right-handed.   Caffeine use:  One can soda per day.   Social Determinants of Health   Financial Resource Strain:   .  Difficulty of Paying Living Expenses: Not on file  Food Insecurity:   . Worried About Charity fundraiser in the Last Year: Not on file  . Ran Out of Food in the Last Year: Not on file  Transportation Needs:   . Lack of Transportation (Medical): Not on file  . Lack of Transportation (Non-Medical): Not on file  Physical Activity:   . Days of Exercise per Week: Not on file  . Minutes of Exercise per Session: Not on file  Stress:   . Feeling of Stress : Not on file  Social Connections:   . Frequency of Communication with Friends and Family: Not on file  . Frequency of Social Gatherings with Friends and Family: Not on file  . Attends Religious Services: Not on file  . Active Member of Clubs or Organizations: Not on file  . Attends Archivist Meetings: Not on file  . Marital Status: Not on file  Intimate Partner Violence:   . Fear of Current or Ex-Partner: Not on file  . Emotionally Abused: Not on file  . Physically Abused: Not on file  . Sexually  Abused: Not on file      PHYSICAL EXAM  There were no vitals filed for this visit. There is no height or weight on file to calculate BMI.  Generalized: Well developed, in no acute distress   Neurological examination  Mentation: Alert oriented to time, place, history taking. Follows all commands speech and language fluent Cranial nerve II-XII: Pupils were equal round reactive to light. Extraocular movements were full, visual field were full on confrontational test. Facial sensation and strength were normal. Uvula tongue midline. Head turning and shoulder shrug  were normal and symmetric. Motor: The motor testing reveals 5 over 5 strength of all 4 extremities. Good symmetric motor tone is noted throughout.  Sensory: Sensory testing is intact to soft touch on all 4 extremities. No evidence of extinction is noted.  Coordination: Cerebellar testing reveals good finger-nose-finger and heel-to-shin bilaterally.  Gait and station: Gait is normal. Tandem gait is normal. Romberg is negative. No drift is seen.  Reflexes: Deep tendon reflexes are symmetric and normal bilaterally.   DIAGNOSTIC DATA (LABS, IMAGING, TESTING) - I reviewed patient records, labs, notes, testing and imaging myself where available.  Lab Results  Component Value Date   WBC 7.3 06/11/2019   HGB 13.6 06/11/2019   HCT 41.8 06/11/2019   MCV 84 06/11/2019   PLT 228 12/02/2017      Component Value Date/Time   NA 143 06/11/2019 1024   K 4.5 06/11/2019 1024   CL 108 (H) 06/11/2019 1024   CO2 22 06/11/2019 1024   GLUCOSE 85 06/11/2019 1024   GLUCOSE 97 12/02/2017 0841   BUN 11 06/11/2019 1024   CREATININE 1.05 (H) 06/11/2019 1024   CALCIUM 9.3 06/11/2019 1024   PROT 7.2 06/11/2019 1024   ALBUMIN 4.1 06/11/2019 1024   AST 29 06/11/2019 1024   ALT 28 06/11/2019 1024   ALKPHOS 95 06/11/2019 1024   BILITOT 0.2 06/11/2019 1024   GFRNONAA 66 06/11/2019 1024   GFRAA 76 06/11/2019 1024   No results found for: CHOL,  HDL, LDLCALC, LDLDIRECT, TRIG, CHOLHDL Lab Results  Component Value Date   HGBA1C 5.3 06/11/2019   Lab Results  Component Value Date   [id number] 144 06/11/2019   Lab Results  Component Value Date   TSH 1.400 06/11/2019      ASSESSMENT AND PLAN 39 y.o. year old female  has a past medical history of Acid reflux, Arthritis, Asthma, Carpal tunnel syndrome, Eczema, Fibromyalgia, Gout, Migraine, Overactive bladder, and Polyarthralgia. here with:  1.  Bilateral hand and feet burning pain -Had skin biopsy 5/13 was consistent with small fiber neuropathy -EMG/NCV April 2021 showed no large fiber peripheral neuropathy -2-year history of patchy skin lesions, positive ANA, double-stranded DNA -Failed to respond to gabapentin up to 900 mg TID -Continue Lyrica 150 mg TID, Cymbalta 60 mg twice a day -Referred to rheumatology, was declined by Dr. Estanislado Pandy, seen by Dr. Gavin Pound, suspicious for autoimmune disease contributing to her symptoms as well, was placed on Plaquenil  2.  Chronic migraine headaches -Continue Topamax 100 mg twice a day -Continue Inderal -Continue Aimovig 70 mg monthly injection -Continue nortriptyline 25 mg, 4 at bedtime -Suboptimal response to Imitrex and Maxalt, continue Zomig 5 mg as needed   I spent 15 minutes with the patient. 50% of this time was spent   Butler Denmark, [place], DNP 02/10/2020, 5:54 AM [facility] [address], [place], [place] [address] [phone]

## 2020-03-15 ENCOUNTER — Telehealth: Payer: Self-pay | Admitting: Neurology

## 2020-03-15 NOTE — Telephone Encounter (Signed)
PA submitted on cover my meds/ [employer] for the pt for Aimovig. KEY: [id number] Will await response

## 2020-03-15 NOTE — Telephone Encounter (Signed)
PA approved through 03/18/2021. FH#[id number].

## 2020-03-28 ENCOUNTER — Telehealth: Payer: Self-pay | Admitting: Neurology

## 2020-03-28 DIAGNOSIS — G43711 Chronic migraine without aura, intractable, with status migrainosus: Secondary | ICD-10-CM

## 2020-03-28 NOTE — Telephone Encounter (Signed)
Pt called and stated that her Erenumab-aooe (AIMOVIG) 70 MG/ML SOAJ is wearing off to fast and she is wanting to know if the mediation can be changed or what can be advised to her. Please advise.

## 2020-03-29 MED ORDER — AIMOVIG 140 MG/ML ~~LOC~~ SOAJ: 140.0000 mg | SUBCUTANEOUS | 11 refills | Status: DC

## 2020-03-29 NOTE — Telephone Encounter (Signed)
Please let patient know to increase aimovig to 40 mg every month   Meds ordered this encounter  Medications  . Erenumab-aooe (AIMOVIG) 140 MG/ML SOAJ    Sig: Inject 140 mg into the skin every 30 (thirty) days.    Dispense:  1.12 mL    Refill:  11

## 2020-03-29 NOTE — Telephone Encounter (Signed)
I called the patient and let her know that Dr Terrace Arabia has increased her dose of the Aimovig to 140 mg every 30 days. Pt aware insurance may need prior auth and pharmacy will let us know. We will then see her f/u in March to see how she is doing. Pt verbalized understanding and appreciation for the call.

## 2020-03-30 NOTE — Telephone Encounter (Signed)
Pt called, can I take Aimovig every two week? Medication wears off at the end of the month. Would like in my system without wearing off. Would like a call from the nurse.

## 2020-03-31 MED ORDER — NORTRIPTYLINE HCL 25 MG PO CAPS: 100.0000 mg | ORAL_CAPSULE | Freq: Every day | ORAL | 0 refills | Status: DC

## 2020-03-31 NOTE — Telephone Encounter (Signed)
I spoke to patient and made her aware that she should not take the injection every 2 weeks and that insurance wouldn't approve that frequency but also advised her to give the increased dose time to work. I advised her that she could take it every 28 days. Patient voiced understanding and appreciation.

## 2020-03-31 NOTE — Addendum Note (Signed)
Addended by: Rocky Link A on: 03/31/2020 05:32 PM   Modules accepted: Orders

## 2020-04-05 DIAGNOSIS — J069 Acute upper respiratory infection, unspecified: Secondary | ICD-10-CM | POA: Diagnosis not present

## 2020-04-05 DIAGNOSIS — R69 Illness, unspecified: Secondary | ICD-10-CM | POA: Diagnosis not present

## 2020-04-05 DIAGNOSIS — U071 COVID-19: Secondary | ICD-10-CM | POA: Diagnosis not present

## 2020-04-15 ENCOUNTER — Other Ambulatory Visit: Payer: Self-pay

## 2020-04-15 ENCOUNTER — Ambulatory Visit
Admission: RE | Admit: 2020-04-15 | Discharge: 2020-04-15 | Disposition: A | Discharge status: Home / Self Care | Payer: Medicare HMO | Source: Ambulatory Visit | Attending: Family | Admitting: Family

## 2020-04-15 DIAGNOSIS — Z1231 Encounter for screening mammogram for malignant neoplasm of breast: Secondary | ICD-10-CM | POA: Diagnosis not present

## 2020-04-26 DIAGNOSIS — M797 Fibromyalgia: Secondary | ICD-10-CM | POA: Diagnosis not present

## 2020-04-26 DIAGNOSIS — M359 Systemic involvement of connective tissue, unspecified: Secondary | ICD-10-CM | POA: Diagnosis not present

## 2020-04-26 DIAGNOSIS — Z79899 Other long term (current) drug therapy: Secondary | ICD-10-CM | POA: Diagnosis not present

## 2020-04-26 DIAGNOSIS — Z6841 Body Mass Index (BMI) 40.0 and over, adult: Secondary | ICD-10-CM | POA: Diagnosis not present

## 2020-04-26 DIAGNOSIS — M2550 Pain in unspecified joint: Secondary | ICD-10-CM | POA: Diagnosis not present

## 2020-04-26 DIAGNOSIS — D6861 Antiphospholipid syndrome: Secondary | ICD-10-CM | POA: Diagnosis not present

## 2020-04-26 DIAGNOSIS — M150 Primary generalized (osteo)arthritis: Secondary | ICD-10-CM | POA: Diagnosis not present

## 2020-04-26 DIAGNOSIS — K760 Fatty (change of) liver, not elsewhere classified: Secondary | ICD-10-CM | POA: Diagnosis not present

## 2020-05-02 NOTE — Telephone Encounter (Signed)
I submitted a PA request for the Aimovig 140mg  on CMM, Key: .   Received instant approval. Effective 03/19/2020 - 07/31/2020.  Pharmacy aware.

## 2020-05-23 ENCOUNTER — Telehealth: Payer: Self-pay | Admitting: Neurology

## 2020-05-23 ENCOUNTER — Ambulatory Visit (INDEPENDENT_AMBULATORY_CARE_PROVIDER_SITE_OTHER): Payer: Medicare HMO | Admitting: Neurology

## 2020-05-23 ENCOUNTER — Encounter: Payer: Self-pay | Admitting: Neurology

## 2020-05-23 VITALS — BP 99/66 | HR 103 | Ht 68.0 in | Wt >= 6400 oz

## 2020-05-23 DIAGNOSIS — H538 Other visual disturbances: Secondary | ICD-10-CM | POA: Diagnosis not present

## 2020-05-23 DIAGNOSIS — G932 Benign intracranial hypertension: Secondary | ICD-10-CM | POA: Diagnosis not present

## 2020-05-23 DIAGNOSIS — R202 Paresthesia of skin: Secondary | ICD-10-CM

## 2020-05-23 DIAGNOSIS — G43711 Chronic migraine without aura, intractable, with status migrainosus: Secondary | ICD-10-CM | POA: Diagnosis not present

## 2020-05-23 MED ORDER — PROPRANOLOL HCL 40 MG PO TABS: 40.0000 mg | ORAL_TABLET | Freq: Two times a day (BID) | ORAL | 3 refills | Status: DC

## 2020-05-23 MED ORDER — NORTRIPTYLINE HCL 50 MG PO CAPS: 100.0000 mg | ORAL_CAPSULE | Freq: Every day | ORAL | 11 refills | Status: DC

## 2020-05-23 MED ORDER — ELETRIPTAN HYDROBROMIDE 40 MG PO TABS: 40.0000 mg | ORAL_TABLET | ORAL | 6 refills | Status: DC | PRN

## 2020-05-23 MED ORDER — TOPIRAMATE 100 MG PO TABS
100.0000 mg | ORAL_TABLET | Freq: Two times a day (BID) | ORAL | 11 refills | Status: DC
Start: 2020-05-23 — End: 2021-04-27

## 2020-05-23 NOTE — Progress Notes (Signed)
PATIENT: Tracy Banks DOB: [birth year]  Chief Complaint  Patient presents with   Follow-up    Reports 3-4 headache days weekly. She is currently taking nortriptyline 42m, four caps QHS, propranolol 446m one tab BID and Aimovig 1404monthly. She is using Zomig for rescue but she does not feel it provides much relief. She takes Lyrica for her paresthesia.     HISTORICAL  Tracy Banks a 37 73ars old female, seen in request by her primary care nurse practitioner StaSuella Broador evaluation of migraine headache,  I have reviewed and summarized the referring note from the referring physician.  She reported a history of migraine headaches since high school, but her migraine are lateralized severe pounding headache with associated light noise sensitivity, lasting for hours, going to sleep usually helps, she used to have migraine 2-3 times each month, trigger for her migraines are light, noise, smell from freshly cut grass, stress, sleep deprivation, weather changes, her son, mother also suffered migraines,  She also has history of obesity, has been on blood pressures for over past 10 years, her weight is overall stable, there was no significant visual changes.  She complains of slow worsening migraine headaches over the past couple years, has been getting injection as a urgent abortive treatment at her primary care's office every 2 to 4 weeks, Imitrex provide limited help, Phenergan, sleep is usually helpful, she has been taking over-the-counter medication, in combination with Imitrex almost every other day to control her headache over the past few months.  She also had visual auras preceding her migraine, blurry vision, distortion of the colors,   She was started on Inderal 40 mg twice a day, Emagality since February 2020, no significant benefit noticed yet.  She is also on polypharmacy for fibromyalgia, Elavil 50 mg every night Cymbalta 60 mg daily, gabapentin 900 mg 3 times  a day.  UPDATE June 11 2019: She was on Emgality for about a year, during that period of time her headache was under reasonable control, it was stopped since February 2021, since then, she noticed increased to headache, 2-3 times headache daily, bilateral temporal parietal region pounding headache, with light, noise sensitivity, she has tried Maxalt, without helping her headaches  She is also on preventive medication Topamax, Inderal, she complains of difficulty sleeping, had frequent headaches when trying to go to sleep  In addition, she complains of bilateral hands and toes numbness tingling since 2020, intermittent involving all toes, and all 5 fingers, it hurt when she bear weight, she also complains of right-sided neck pain, radiating pain to right shoulder  She denies persistent upper or lower extremity sensory loss, weakness, incontinence  UPDATE July 08 2019: Patient return for electrodiagnostic study today, which was essentially normal, there was no evidence of large fiber peripheral neuropathy or bilateral lumbosacral radiculopathy  She complains 2 years history of intermittent patchy skin lesions, a year history of bilateral fingertips and the feet burning pain,  Laboratory evaluations on June 11, 2019 showed positive ANA, double-stranded DNA 39, elevated ESR of 54, CMP showed mild elevated creatinine 1.05, otherwise normal CBC, protein electrophoresis, vitamin B12, HIV, RPR, TSH,  She has been taking gabapentin up to 900 mg 3 times daily without helping her neuropathic pain significantly, also on Cymbalta 60 mg twice daily, has never tried Lyrica  UPDATE May 23 2020: She was seen by rheumatologist Dr. AngLenn Sink October 22, 2019, did have some sun sensitive skin rash, antiphospholipid antibody, elevated  ESR, low positive double-stranded DNA with all the other labs for lupus being negative Positive p-ANCA but all other labs for vasculitis was negative Slight elevated CK of  unknown clinical significance  She was diagnosed with connective tissue disorder, was started on Plaquenil, which did help her symptoms some, Also had a skin biopsy in May 2021, informed with diagnosis of small fiber neuropathy,  She continue complains of headache up to 5 times each week, frequent stabbing pain, sometimes longer moderate steady pain couple times a week with light noise sensitivity, nauseous, Zomig did not help her symptoms  She did complains of slow worsening blurry vision, noted over 40 pounds weight gain over past 10 months, she has not seen by ophthalmologist for many months    REVIEW OF SYSTEMS: Full 14 system review of systems performed and notable only for as above All other review of systems were negative.  ALLERGIES: Allergies  Allergen Reactions   Penicillins     Rapid heart beat Has patient had a PCN reaction causing immediate rash, facial/tongue/throat swelling, SOB or lightheadedness with hypotension: no Has patient had a PCN reaction causing severe rash involving mucus membranes or skin necrosis: no Has patient had a PCN reaction that required hospitalization unknown Has patient had a PCN reaction occurring within the last 10 years: no If all of the above answers are "NO", then may proceed with Cephalosporin use.     HOME MEDICATIONS: Current Outpatient Medications  Medication Sig Dispense Refill   cyclobenzaprine (FLEXERIL) 10 MG tablet Take 10 mg by mouth 3 (three) times daily as needed for muscle spasms.  0   Erenumab-aooe (AIMOVIG) 140 MG/ML SOAJ Inject 140 mg into the skin every 30 (thirty) days. 1.12 mL 11   esomeprazole (NEXIUM) 40 MG capsule Take 40 mg by mouth daily at 12 noon.     fluticasone (FLOVENT HFA) 110 MCG/ACT inhaler Inhale 2 puffs into the lungs 2 (two) times daily.     Nabumetone (RELAFEN DS) 1000 MG TABS Take 1 tablet by mouth daily.     nortriptyline (PAMELOR) 25 MG capsule Take 4 capsules (100 mg total) by mouth at  bedtime. 120 capsule 0   oxybutynin (DITROPAN) 5 MG tablet Take 5 mg by mouth 2 (two) times daily.  0   pregabalin (LYRICA) 150 MG capsule TAKE 1 CAPSULE(150 MG) BY MOUTH THREE TIMES DAILY 90 capsule 5   propranolol (INDERAL) 40 MG tablet TAKE 1 TABLET(40 MG) BY MOUTH TWICE DAILY 180 tablet 3   TRIAMTERENE PO Take 1 tablet by mouth daily. Unsure of dosage     zolmitriptan (ZOMIG ZMT) 5 MG disintegrating tablet Take 1 tab at onset of migraine.  May repeat in 2 hrs, if needed.  Max dose: 2 tabs/day. This is a 30 day prescription. 10 tablet 5   No current facility-administered medications for this visit.    PAST MEDICAL HISTORY: Past Medical History:  Diagnosis Date   Acid reflux    Arthritis    Asthma    Carpal tunnel syndrome    Eczema    Fibromyalgia    Gout    Migraine    Overactive bladder    Polyarthralgia     PAST SURGICAL HISTORY: Past Surgical History:  Procedure Laterality Date   ANKLE SURGERY Bilateral    NEUROMA SURGERY      FAMILY HISTORY: Family History  Problem Relation Age of Onset   Sarcoidosis Mother    Migraines Mother    Hypertension Mother  Osteoarthritis Mother    Diabetes Mother    Fibromyalgia Mother    Healthy Father     SOCIAL HISTORY: Social History   Socioeconomic History   Marital status: Single    Spouse name: Not on file   Number of children: 3   Years of education: college   Highest education level: Not on file  Occupational History   Not on file  Tobacco Use   Smoking status: Current Every Day Smoker    Types: Cigarettes   Smokeless tobacco: Never Used   Tobacco comment: 1 pack per week  Substance and Sexual Activity   Alcohol use: Yes    Comment: occ   Drug use: No   Sexual activity: Not on file  Other Topics Concern   Not on file  Social History Narrative   Lives at home with children.   Right-handed.   Caffeine use:  One can soda per day.   Social Determinants of Health    Financial Resource Strain: Not on file  Food Insecurity: Not on file  Transportation Needs: Not on file  Physical Activity: Not on file  Stress: Not on file  Social Connections: Not on file  Intimate Partner Violence: Not on file     PHYSICAL EXAM   Vitals:   05/23/20 1114  BP: 99/66  Pulse: (!) 103  Weight: (!) 418 lb (189.6 kg)  Height: 5' 8" (1.727 m)   Not recorded     Body mass index is 63.56 kg/m.  PHYSICAL EXAMNIATION:  Gen: NAD, conversant, well nourised, obese, well groomed                     Skin: Patchy dark scaly skin discoloration  NEUROLOGICAL EXAM: Morbid obesity  MENTAL STATUS: Speech/cognition: Awake alert oriented to history taking care of conversation   CRANIAL NERVES: CN II: Visual fields are full to confrontation.  Pupils are round equal and briskly reactive to light. CN III, IV, VI: extraocular movement are normal. No ptosis. CN V: Facial sensation is intact  CN VII: Face is symmetric with normal eye closure and smile. CN VIII: Hearing is normal to rubbing fingers CN IX, X: \Phonation is normal. CN XI: Head turning and shoulder shrug are intact _0 MOTOR: There is no pronator drift of out-stretched arms. Muscle bulk and tone are normal. Muscle strength is normal.  REFLEXES: Reflexes are 1 and symmetric at the biceps, triceps, knees, and ankles. Plantar responses are flexor.  SENSORY: Intact to light touch, pinprick, positional sensation and vibratory sensation are intact in fingers and toes.  COORDINATION: Rapid alternating movements and fine finger movements are intact. There is no dysmetria on finger-to-nose and heel-knee-shin.    GAIT/STANCE: She can get up from seated position arms crossed, steady, limited by her big body habitus  DIAGNOSTIC DATA (LABS, IMAGING, TESTING) - I reviewed patient records, labs, notes, testing and imaging myself where available.   ASSESSMENT AND PLAN  Tracy Banks is a 40 y.o. female    Bilateral hands and feet burning pain,  Consistent with neuropathic pain,  EMG nerve conduction study today showed no large fiber peripheral neuropathy  Skin biopsy confirmed small fiber neuropathy  Was seen by rheumatologist, with her positive serum markers, sun sensitive skin rash, she was diagnosed with connective tissue disease, started on Plaquenil,  Continue Lyrica, 150 mg 3 times a day, nortriptyline 50 mg 2 tablets every night   Worsening headache  Long history of migraine headaches, failed to response  to multiple preventive medications, such as Topamax, nortriptyline, aimovig,  With her rapid weight gain, reported history of blurry vision, referral her to ophthalmology to rule out intracranial hypertension,  MRI of brain, if there is no structural abnormality, will refer "to fluoroscopy guided lumbar puncture     Marcial Pacas, M.D. Ph.D.  [facility] [address], [place], [place] [address] Ph: [phone] Fax: [phone]  CC: Suella Broad, FNP

## 2020-05-23 NOTE — Telephone Encounter (Signed)
[employer] medicare order sent to [facility]. They will obtain the auth and reach out to the patient to schedule.  °

## 2020-05-31 DIAGNOSIS — E559 Vitamin D deficiency, unspecified: Secondary | ICD-10-CM | POA: Diagnosis not present

## 2020-05-31 DIAGNOSIS — G43909 Migraine, unspecified, not intractable, without status migrainosus: Secondary | ICD-10-CM | POA: Diagnosis not present

## 2020-05-31 DIAGNOSIS — Z136 Encounter for screening for cardiovascular disorders: Secondary | ICD-10-CM | POA: Diagnosis not present

## 2020-05-31 DIAGNOSIS — N390 Urinary tract infection, site not specified: Secondary | ICD-10-CM | POA: Diagnosis not present

## 2020-05-31 DIAGNOSIS — E669 Obesity, unspecified: Secondary | ICD-10-CM | POA: Diagnosis not present

## 2020-05-31 DIAGNOSIS — Z1331 Encounter for screening for depression: Secondary | ICD-10-CM | POA: Diagnosis not present

## 2020-05-31 DIAGNOSIS — R69 Illness, unspecified: Secondary | ICD-10-CM | POA: Diagnosis not present

## 2020-05-31 DIAGNOSIS — E785 Hyperlipidemia, unspecified: Secondary | ICD-10-CM | POA: Diagnosis not present

## 2020-05-31 DIAGNOSIS — M2550 Pain in unspecified joint: Secondary | ICD-10-CM | POA: Diagnosis not present

## 2020-05-31 DIAGNOSIS — I10 Essential (primary) hypertension: Secondary | ICD-10-CM | POA: Diagnosis not present

## 2020-06-03 ENCOUNTER — Ambulatory Visit
Admission: RE | Admit: 2020-06-03 | Discharge: 2020-06-03 | Disposition: A | Discharge status: Home / Self Care | Payer: Medicare HMO | Source: Ambulatory Visit | Attending: Neurology | Admitting: Neurology

## 2020-06-03 DIAGNOSIS — G932 Benign intracranial hypertension: Secondary | ICD-10-CM

## 2020-06-17 ENCOUNTER — Telehealth: Payer: Self-pay | Admitting: *Deleted

## 2020-06-17 NOTE — Telephone Encounter (Addendum)
PA for Aimovig 140mg  started on covermymeds (key: [id number]). Pt has coverage through [employer] [phone]). Decision pending.

## 2020-06-20 NOTE — Telephone Encounter (Signed)
PA approved through 03/18/21. 

## 2020-06-23 ENCOUNTER — Other Ambulatory Visit: Payer: Self-pay | Admitting: Neurology

## 2020-06-23 DIAGNOSIS — G43711 Chronic migraine without aura, intractable, with status migrainosus: Secondary | ICD-10-CM

## 2020-07-12 DIAGNOSIS — Z01419 Encounter for gynecological examination (general) (routine) without abnormal findings: Secondary | ICD-10-CM | POA: Diagnosis not present

## 2020-07-19 ENCOUNTER — Other Ambulatory Visit: Payer: Self-pay

## 2020-07-19 ENCOUNTER — Ambulatory Visit
Admission: RE | Admit: 2020-07-19 | Discharge: 2020-07-19 | Disposition: A | Discharge status: Home / Self Care | Payer: Medicare HMO | Source: Ambulatory Visit | Attending: Neurology | Admitting: Neurology

## 2020-07-19 DIAGNOSIS — G932 Benign intracranial hypertension: Secondary | ICD-10-CM | POA: Diagnosis not present

## 2020-08-18 DIAGNOSIS — G43909 Migraine, unspecified, not intractable, without status migrainosus: Secondary | ICD-10-CM | POA: Diagnosis not present

## 2020-08-18 DIAGNOSIS — R69 Illness, unspecified: Secondary | ICD-10-CM | POA: Diagnosis not present

## 2020-08-18 DIAGNOSIS — I10 Essential (primary) hypertension: Secondary | ICD-10-CM | POA: Diagnosis not present

## 2020-08-18 DIAGNOSIS — G8929 Other chronic pain: Secondary | ICD-10-CM | POA: Diagnosis not present

## 2020-08-18 DIAGNOSIS — J309 Allergic rhinitis, unspecified: Secondary | ICD-10-CM | POA: Diagnosis not present

## 2020-08-18 DIAGNOSIS — M3219 Other organ or system involvement in systemic lupus erythematosus: Secondary | ICD-10-CM | POA: Diagnosis not present

## 2020-08-18 DIAGNOSIS — F419 Anxiety disorder, unspecified: Secondary | ICD-10-CM | POA: Diagnosis not present

## 2020-08-18 DIAGNOSIS — E785 Hyperlipidemia, unspecified: Secondary | ICD-10-CM | POA: Diagnosis not present

## 2020-08-18 DIAGNOSIS — M1990 Unspecified osteoarthritis, unspecified site: Secondary | ICD-10-CM | POA: Diagnosis not present

## 2020-08-25 ENCOUNTER — Ambulatory Visit: Payer: Medicare HMO | Admitting: Neurology

## 2020-09-30 DIAGNOSIS — R8279 Other abnormal findings on microbiological examination of urine: Secondary | ICD-10-CM | POA: Diagnosis not present

## 2020-09-30 DIAGNOSIS — N3946 Mixed incontinence: Secondary | ICD-10-CM | POA: Diagnosis not present

## 2020-10-13 ENCOUNTER — Encounter: Payer: Self-pay | Admitting: Radiology

## 2020-10-18 ENCOUNTER — Encounter: Payer: Medicare HMO | Admitting: Obstetrics & Gynecology

## 2020-11-03 DIAGNOSIS — R69 Illness, unspecified: Secondary | ICD-10-CM | POA: Diagnosis not present

## 2020-11-14 DIAGNOSIS — N3941 Urge incontinence: Secondary | ICD-10-CM | POA: Diagnosis not present

## 2020-11-14 DIAGNOSIS — R8279 Other abnormal findings on microbiological examination of urine: Secondary | ICD-10-CM | POA: Diagnosis not present

## 2020-11-17 DIAGNOSIS — R69 Illness, unspecified: Secondary | ICD-10-CM | POA: Diagnosis not present

## 2021-01-10 DIAGNOSIS — M24574 Contracture, right foot: Secondary | ICD-10-CM | POA: Diagnosis not present

## 2021-01-10 DIAGNOSIS — M722 Plantar fascial fibromatosis: Secondary | ICD-10-CM | POA: Diagnosis not present

## 2021-01-10 DIAGNOSIS — M7752 Other enthesopathy of left foot: Secondary | ICD-10-CM | POA: Diagnosis not present

## 2021-01-10 DIAGNOSIS — M7671 Peroneal tendinitis, right leg: Secondary | ICD-10-CM | POA: Diagnosis not present

## 2021-01-10 DIAGNOSIS — M7751 Other enthesopathy of right foot: Secondary | ICD-10-CM | POA: Diagnosis not present

## 2021-01-10 DIAGNOSIS — M7989 Other specified soft tissue disorders: Secondary | ICD-10-CM | POA: Diagnosis not present

## 2021-01-10 DIAGNOSIS — M24575 Contracture, left foot: Secondary | ICD-10-CM | POA: Diagnosis not present

## 2021-01-18 DIAGNOSIS — Z6841 Body Mass Index (BMI) 40.0 and over, adult: Secondary | ICD-10-CM | POA: Diagnosis not present

## 2021-01-18 DIAGNOSIS — Z0001 Encounter for general adult medical examination with abnormal findings: Secondary | ICD-10-CM | POA: Diagnosis not present

## 2021-01-18 DIAGNOSIS — Z1159 Encounter for screening for other viral diseases: Secondary | ICD-10-CM | POA: Diagnosis not present

## 2021-01-18 DIAGNOSIS — E559 Vitamin D deficiency, unspecified: Secondary | ICD-10-CM | POA: Diagnosis not present

## 2021-01-18 DIAGNOSIS — Z23 Encounter for immunization: Secondary | ICD-10-CM | POA: Diagnosis not present

## 2021-01-18 DIAGNOSIS — N951 Menopausal and female climacteric states: Secondary | ICD-10-CM | POA: Diagnosis not present

## 2021-01-18 DIAGNOSIS — R7301 Impaired fasting glucose: Secondary | ICD-10-CM | POA: Diagnosis not present

## 2021-01-18 DIAGNOSIS — R0683 Snoring: Secondary | ICD-10-CM | POA: Diagnosis not present

## 2021-01-18 DIAGNOSIS — Z0000 Encounter for general adult medical examination without abnormal findings: Secondary | ICD-10-CM | POA: Diagnosis not present

## 2021-01-23 DIAGNOSIS — D6861 Antiphospholipid syndrome: Secondary | ICD-10-CM | POA: Diagnosis not present

## 2021-01-23 DIAGNOSIS — Z6841 Body Mass Index (BMI) 40.0 and over, adult: Secondary | ICD-10-CM | POA: Diagnosis not present

## 2021-01-23 DIAGNOSIS — M150 Primary generalized (osteo)arthritis: Secondary | ICD-10-CM | POA: Diagnosis not present

## 2021-01-23 DIAGNOSIS — M797 Fibromyalgia: Secondary | ICD-10-CM | POA: Diagnosis not present

## 2021-01-23 DIAGNOSIS — Z79899 Other long term (current) drug therapy: Secondary | ICD-10-CM | POA: Diagnosis not present

## 2021-01-23 DIAGNOSIS — M2550 Pain in unspecified joint: Secondary | ICD-10-CM | POA: Diagnosis not present

## 2021-01-23 DIAGNOSIS — M359 Systemic involvement of connective tissue, unspecified: Secondary | ICD-10-CM | POA: Diagnosis not present

## 2021-02-20 DIAGNOSIS — G43909 Migraine, unspecified, not intractable, without status migrainosus: Secondary | ICD-10-CM | POA: Diagnosis not present

## 2021-02-20 DIAGNOSIS — Z79899 Other long term (current) drug therapy: Secondary | ICD-10-CM | POA: Diagnosis not present

## 2021-02-20 DIAGNOSIS — I10 Essential (primary) hypertension: Secondary | ICD-10-CM | POA: Diagnosis not present

## 2021-02-20 DIAGNOSIS — E785 Hyperlipidemia, unspecified: Secondary | ICD-10-CM | POA: Diagnosis not present

## 2021-02-20 DIAGNOSIS — F331 Major depressive disorder, recurrent, moderate: Secondary | ICD-10-CM | POA: Diagnosis not present

## 2021-02-20 DIAGNOSIS — D8481 Immunodeficiency due to conditions classified elsewhere: Secondary | ICD-10-CM | POA: Diagnosis not present

## 2021-02-20 DIAGNOSIS — E559 Vitamin D deficiency, unspecified: Secondary | ICD-10-CM | POA: Diagnosis not present

## 2021-02-20 DIAGNOSIS — D6861 Antiphospholipid syndrome: Secondary | ICD-10-CM | POA: Diagnosis not present

## 2021-02-20 DIAGNOSIS — R69 Illness, unspecified: Secondary | ICD-10-CM | POA: Diagnosis not present

## 2021-02-20 DIAGNOSIS — Z1159 Encounter for screening for other viral diseases: Secondary | ICD-10-CM | POA: Diagnosis not present

## 2021-02-20 DIAGNOSIS — F17210 Nicotine dependence, cigarettes, uncomplicated: Secondary | ICD-10-CM | POA: Diagnosis not present

## 2021-02-20 DIAGNOSIS — M329 Systemic lupus erythematosus, unspecified: Secondary | ICD-10-CM | POA: Diagnosis not present

## 2021-03-16 ENCOUNTER — Other Ambulatory Visit: Payer: Self-pay | Admitting: Family Medicine

## 2021-03-16 DIAGNOSIS — I10 Essential (primary) hypertension: Secondary | ICD-10-CM | POA: Diagnosis not present

## 2021-03-16 DIAGNOSIS — R69 Illness, unspecified: Secondary | ICD-10-CM | POA: Diagnosis not present

## 2021-03-16 DIAGNOSIS — Z79899 Other long term (current) drug therapy: Secondary | ICD-10-CM | POA: Diagnosis not present

## 2021-03-16 DIAGNOSIS — Z23 Encounter for immunization: Secondary | ICD-10-CM | POA: Diagnosis not present

## 2021-03-16 DIAGNOSIS — Z1231 Encounter for screening mammogram for malignant neoplasm of breast: Secondary | ICD-10-CM

## 2021-03-16 DIAGNOSIS — D6861 Antiphospholipid syndrome: Secondary | ICD-10-CM | POA: Diagnosis not present

## 2021-03-16 DIAGNOSIS — J45909 Unspecified asthma, uncomplicated: Secondary | ICD-10-CM | POA: Diagnosis not present

## 2021-03-16 DIAGNOSIS — D8481 Immunodeficiency due to conditions classified elsewhere: Secondary | ICD-10-CM | POA: Diagnosis not present

## 2021-03-16 DIAGNOSIS — Z0001 Encounter for general adult medical examination with abnormal findings: Secondary | ICD-10-CM | POA: Diagnosis not present

## 2021-03-17 ENCOUNTER — Other Ambulatory Visit: Payer: Self-pay | Admitting: Neurology

## 2021-03-21 ENCOUNTER — Telehealth: Payer: Self-pay | Admitting: Neurology

## 2021-03-21 NOTE — Telephone Encounter (Signed)
Pt called stating that she has no more pregabalin (LYRICA) 150 MG capsule and the pharmacy informed her that the medication is needing a PA Please advise.

## 2021-03-21 NOTE — Telephone Encounter (Signed)
Verify Drug Registry For Pregabalin 150 mg capsule Last Filled: 09/30/2020 Quantity: 90 for 30 days Last appointment: 05/23/2020 Next appointment: Pending

## 2021-03-22 NOTE — Telephone Encounter (Signed)
I attempted PA on cover my meds but received message back stating pt does not have active [employer] coverage. I called the pt and she confirmed this was true, she now has [employer].  Advised we would need the updated cards before proceeding. Pt did not have access to the cards at the time of the call and did not have access to mychart. Pt reports she will bring the insurance cards by the office tomorrow and we can submit PA then.

## 2021-03-22 NOTE — Telephone Encounter (Signed)
I spoke to the patient. She was past due for follow up and the appt has been scheduled. She provided me with the new ID number.  [employer] [id number].  States the pharmacy also has the incorrect insurance information. Unsure if [facility] will require a PA. She will take the card to the pharmacy tonight and have them try to process it. She will let us know if it still needs the PA. Also, the pharmacy should alert [place] through covermymeds.   She is unable to access her mychart portal. I offered the help desk number but she declined it at this time.

## 2021-03-31 ENCOUNTER — Other Ambulatory Visit: Payer: Self-pay | Admitting: Family

## 2021-03-31 ENCOUNTER — Ambulatory Visit
Admission: RE | Admit: 2021-03-31 | Discharge: 2021-03-31 | Disposition: A | Discharge status: Home / Self Care | Payer: Medicaid Other | Source: Ambulatory Visit | Attending: Family | Admitting: Family

## 2021-03-31 DIAGNOSIS — M545 Low back pain, unspecified: Secondary | ICD-10-CM

## 2021-03-31 DIAGNOSIS — M549 Dorsalgia, unspecified: Secondary | ICD-10-CM

## 2021-04-13 ENCOUNTER — Ambulatory Visit: Payer: Medicare HMO

## 2021-04-18 ENCOUNTER — Ambulatory Visit
Admission: RE | Admit: 2021-04-18 | Discharge: 2021-04-18 | Disposition: A | Discharge status: Home / Self Care | Payer: Medicaid Other | Source: Ambulatory Visit | Attending: Family Medicine | Admitting: Family Medicine

## 2021-04-18 DIAGNOSIS — Z1231 Encounter for screening mammogram for malignant neoplasm of breast: Secondary | ICD-10-CM

## 2021-04-21 ENCOUNTER — Other Ambulatory Visit (HOSPITAL_BASED_OUTPATIENT_CLINIC_OR_DEPARTMENT_OTHER): Payer: Self-pay

## 2021-04-21 DIAGNOSIS — R0683 Snoring: Secondary | ICD-10-CM

## 2021-04-26 ENCOUNTER — Other Ambulatory Visit: Payer: Self-pay | Admitting: Diagnostic Neuroimaging

## 2021-04-27 ENCOUNTER — Telehealth: Payer: Self-pay | Admitting: Neurology

## 2021-04-27 ENCOUNTER — Encounter: Payer: Self-pay | Admitting: Neurology

## 2021-04-27 ENCOUNTER — Other Ambulatory Visit: Payer: Self-pay | Admitting: Neurology

## 2021-04-27 ENCOUNTER — Ambulatory Visit: Payer: Medicare HMO | Admitting: Neurology

## 2021-04-27 ENCOUNTER — Telehealth: Payer: Self-pay | Admitting: *Deleted

## 2021-04-27 VITALS — BP 107/70 | HR 111 | Ht 68.0 in | Wt 387.0 lb

## 2021-04-27 DIAGNOSIS — G43711 Chronic migraine without aura, intractable, with status migrainosus: Secondary | ICD-10-CM | POA: Diagnosis not present

## 2021-04-27 DIAGNOSIS — G4710 Hypersomnia, unspecified: Secondary | ICD-10-CM | POA: Diagnosis not present

## 2021-04-27 MED ORDER — TOPIRAMATE 100 MG PO TABS: ORAL_TABLET | ORAL | 4 refills | Status: DC

## 2021-04-27 MED ORDER — RIZATRIPTAN BENZOATE 10 MG PO TBDP: 10.0000 mg | ORAL_TABLET | ORAL | 6 refills | Status: DC | PRN

## 2021-04-27 MED ORDER — EMGALITY 120 MG/ML ~~LOC~~ SOAJ: SUBCUTANEOUS | 11 refills | Status: DC

## 2021-04-27 NOTE — Telephone Encounter (Signed)
Sent to Dr. Groat ph # [phone] 

## 2021-04-27 NOTE — Telephone Encounter (Signed)
Submitted PA Emgality on CMM. [id number]. Waiting on determination from [facility].

## 2021-04-27 NOTE — Progress Notes (Signed)
PATIENT: Tracy Banks DOB: April 15, 1980  Chief Complaint  Patient presents with   Follow-up    Rm 15, alone Reports migraines are worse, causes sharp shooting pain in center of forehead, states relpax is ineffective, not using aimovig due to ins      HISTORICAL  Tracy Banks Is a 41 years old female, seen in request by her primary care nurse practitioner Suella Broad, for evaluation of migraine headache,  I have reviewed and summarized the referring note from the referring physician.  She reported a history of migraine headaches since high school, but her migraine are lateralized severe pounding headache with associated light noise sensitivity, lasting for hours, going to sleep usually helps, she used to have migraine 2-3 times each month, trigger for her migraines are light, noise, smell from freshly cut grass, stress, sleep deprivation, weather changes, her son, mother also suffered migraines,  She also has history of obesity, has been on blood pressures for over past 10 years, her weight is overall stable, there was no significant visual changes.  She complains of slow worsening migraine headaches over the past couple years, has been getting injection as a urgent abortive treatment at her primary care's office every 2 to 4 weeks, Imitrex provide limited help, Phenergan, sleep is usually helpful, she has been taking over-the-counter medication, in combination with Imitrex almost every other day to control her headache over the past few months.  She also had visual auras preceding her migraine, blurry vision, distortion of the colors,   She was started on Inderal 40 mg twice a day, Emagality since February 2020, no significant benefit noticed yet.  She is also on polypharmacy for fibromyalgia, Elavil 50 mg every night Cymbalta 60 mg daily, gabapentin 900 mg 3 times a day.  UPDATE June 11 2019: She was on Emgality for about a year, during that period of time her headache  was under reasonable control, it was stopped since February 2021, since then, she noticed increased to headache, 2-3 times headache daily, bilateral temporal parietal region pounding headache, with light, noise sensitivity, she has tried Maxalt, without helping her headaches  She is also on preventive medication Topamax, Inderal, she complains of difficulty sleeping, had frequent headaches when trying to go to sleep  In addition, she complains of bilateral hands and toes numbness tingling since 2020, intermittent involving all toes, and all 5 fingers, it hurt when she bear weight, she also complains of right-sided neck pain, radiating pain to right shoulder  She denies persistent upper or lower extremity sensory loss, weakness, incontinence  UPDATE July 08 2019: Patient return for electrodiagnostic study today, which was essentially normal, there was no evidence of large fiber peripheral neuropathy or bilateral lumbosacral radiculopathy  She complains 2 years history of intermittent patchy skin lesions, a year history of bilateral fingertips and the feet burning pain,  Laboratory evaluations on June 11, 2019 showed positive ANA, double-stranded DNA 39, elevated ESR of 54, CMP showed mild elevated creatinine 1.05, otherwise normal CBC, protein electrophoresis, vitamin B12, HIV, RPR, TSH,  She has been taking gabapentin up to 900 mg 3 times daily without helping her neuropathic pain significantly, also on Cymbalta 60 mg twice daily, has never tried Lyrica  UPDATE May 23 2020: She was seen by rheumatologist Dr. Lenn Sink on October 22, 2019, did have some sun sensitive skin rash, antiphospholipid antibody, elevated ESR, low positive double-stranded DNA with all the other labs for lupus being negative Positive p-ANCA but all other  labs for vasculitis was negative Slight elevated CK of unknown clinical significance  She was diagnosed with connective tissue disorder, was started on Plaquenil,  which did help her symptoms some, Also had a skin biopsy in May 2021, informed with diagnosis of small fiber neuropathy,  She continue complains of headache up to 5 times each week, frequent stabbing pain, sometimes longer moderate steady pain couple times a week with light noise sensitivity, nauseous, Zomig did not help her symptoms  She did complains of slow worsening blurry vision, noted over 40 pounds weight gain over past 10 months, she has not seen by ophthalmologist for many months   UPDATE Feb 9th 2023: Her insurance changed at the beginning of 2023, no longer cover her aimovig, even while she was taking aimovig, she continues to have almost daily moderate headache, bilateral frontal region, as if somebody has punched her forehead, severe pain last about 10 minutes, followed by lower degree of pressure pain lasting for few hours, waxing and wanes throughout the day  She is no longer taking abortive treatment, tried Relpax, does not help, she denies frequent over-the-counter medication use  For preventive medications, she is nowtaking Topamax 100 mg twice a day, nortriptyline 50 mg 2 at bedtime, propanolol 40 mg twice a day, she works as a Scientist, water quality, continue battling weight problem, also began to noticed blurry vision, taking her a while to refocus  Personally reviewed MRI brain in May 2022 that was normal  Laboratory evaluation in 2021 showed positive ANA, and panANCA, taking Plaquenil  She also complains of excessive daytime fatigue, sleepiness, loud snoring    REVIEW OF SYSTEMS: Full 14 system review of systems performed and notable only for as above All other review of systems were negative.  ALLERGIES: Allergies  Allergen Reactions   Penicillins     Rapid heart beat Has patient had a PCN reaction causing immediate rash, facial/tongue/throat swelling, SOB or lightheadedness with hypotension: no Has patient had a PCN reaction causing severe rash involving mucus membranes or  skin necrosis: no Has patient had a PCN reaction that required hospitalization unknown Has patient had a PCN reaction occurring within the last 10 years: no If all of the above answers are "NO", then may proceed with Cephalosporin use.     HOME MEDICATIONS: Current Outpatient Medications  Medication Sig Dispense Refill   cyclobenzaprine (FLEXERIL) 10 MG tablet Take 10 mg by mouth 3 (three) times daily as needed for muscle spasms.  0   DULoxetine (CYMBALTA) 30 MG capsule SMARTSIG:1 Capsule(s) By Mouth Every Evening     DULoxetine (CYMBALTA) 60 MG capsule 1 capsule     eletriptan (RELPAX) 40 MG tablet Take 1 tablet (40 mg total) by mouth as needed for migraine or headache. May repeat in 2 hours if headache persists or recurs. 12 tablet 6   Erenumab-aooe (AIMOVIG) 140 MG/ML SOAJ Inject 140 mg into the skin every 30 (thirty) days. 1.12 mL 11   esomeprazole (NEXIUM) 40 MG capsule Take 40 mg by mouth daily at 12 noon.     fluticasone (FLOVENT HFA) 110 MCG/ACT inhaler Inhale 2 puffs into the lungs 2 (two) times daily.     hydroxychloroquine (PLAQUENIL) 200 MG tablet Take 1 tablet by mouth 2 (two) times daily.     Nabumetone (RELAFEN DS) 1000 MG TABS Take 1 tablet by mouth daily.     nortriptyline (PAMELOR) 50 MG capsule Take 2 capsules (100 mg total) by mouth at bedtime. 60 capsule 11   pregabalin (LYRICA)  150 MG capsule TAKE 1 CAPSULE(150 MG) BY MOUTH THREE TIMES DAILY 90 capsule 0   propranolol (INDERAL) 40 MG tablet Take 1 tablet (40 mg total) by mouth 2 (two) times daily. 180 tablet 3   tolterodine (DETROL LA) 4 MG 24 hr capsule Take 4 mg by mouth daily.     topiramate (TOPAMAX) 100 MG tablet Take 1 tablet (100 mg total) by mouth 2 (two) times daily. 60 tablet 11   TRIAMTERENE PO Take 1 tablet by mouth daily. Unsure of dosage     ziprasidone (GEODON) 20 MG capsule SMARTSIG:1 Capsule(s) By Mouth Every Evening     No current facility-administered medications for this visit.    PAST MEDICAL  HISTORY: Past Medical History:  Diagnosis Date   Acid reflux    Arthritis    Asthma    Carpal tunnel syndrome    Eczema    Fibromyalgia    Gout    Migraine    Overactive bladder    Polyarthralgia     PAST SURGICAL HISTORY: Past Surgical History:  Procedure Laterality Date   ANKLE SURGERY Bilateral    NEUROMA SURGERY      FAMILY HISTORY: Family History  Problem Relation Age of Onset   Sarcoidosis Mother    Migraines Mother    Hypertension Mother    Osteoarthritis Mother    Diabetes Mother    Fibromyalgia Mother    Healthy Father     SOCIAL HISTORY: Social History   Socioeconomic History   Marital status: Single    Spouse name: Not on file   Number of children: 3   Years of education: college   Highest education level: Not on file  Occupational History   Not on file  Tobacco Use   Smoking status: Every Day    Types: Cigarettes   Smokeless tobacco: Never   Tobacco comments:    1 pack per week  Substance and Sexual Activity   Alcohol use: Yes    Comment: occ   Drug use: No   Sexual activity: Not on file  Other Topics Concern   Not on file  Social History Narrative   Lives at home with children.   Right-handed.   Caffeine use:  One can soda per day.   Social Determinants of Health   Financial Resource Strain: Not on file  Food Insecurity: Not on file  Transportation Needs: Not on file  Physical Activity: Not on file  Stress: Not on file  Social Connections: Not on file  Intimate Partner Violence: Not on file     PHYSICAL EXAM   Vitals:   04/27/21 1303  BP: 107/70  Pulse: (!) 111  Weight: (!) 387 lb (175.5 kg)  Height: _0  (1.727 m)   Not recorded     Body mass index is 58.84 kg/m.  PHYSICAL EXAMNIATION:  Gen: NAD, conversant, well nourised, obese, well groomed                     Skin: Patchy dark scaly skin discoloration  NEUROLOGICAL EXAM: Morbid obesity  MENTAL STATUS: Speech/cognition: Awake alert oriented to history  taking care of conversation   CRANIAL NERVES: CN II: Visual fields are full to confrontation.  Pupils are round equal and briskly reactive to light. CN III, IV, VI: extraocular movement are normal. No ptosis. CN V: Facial sensation is intact  CN VII: Face is symmetric with normal eye closure and smile. CN VIII: Hearing is normal to rubbing fingers  CN IX, X: \Phonation is normal. CN XI: Head turning and shoulder shrug are intact CN XII: Narrow oropharyngeal space  MOTOR: There is no pronator drift of out-stretched arms. Muscle bulk and tone are normal. Muscle strength is normal.  REFLEXES: Reflexes are 1 and symmetric at the biceps, triceps, knees, and ankles. Plantar responses are flexor.  SENSORY: Intact to light touch, pinprick, positional sensation and vibratory sensation are intact in fingers and toes.  COORDINATION: Rapid alternating movements and fine finger movements are intact. There is no dysmetria on finger-to-nose and heel-knee-shin.    GAIT/STANCE: She can get up from seated position arms crossed, steady, limited by her big body habitus  DIAGNOSTIC DATA (LABS, IMAGING, TESTING) - I reviewed patient records, labs, notes, testing and imaging myself where available.   ASSESSMENT AND PLAN  Tracy Banks is a 41 y.o. female   Bilateral hands and feet burning pain,  Consistent with neuropathic pain,  EMG nerve conduction study today showed no large fiber peripheral neuropathy  Skin biopsy confirmed small fiber neuropathy  Was seen by rheumatologist, with her positive serum markers, sun sensitive skin rash, she was diagnosed with connective tissue disease, started on Plaquenil,  Continue Lyrica, 150 mg 3 times a day, nortriptyline 50 mg 2 tablets every night   Worsening headache, at risk for pseudotumor cerebri  MRI of the brain was normal  Refer to ophthalmology for further evaluation,  Increase Topamax to 100/200 mg, keep nortriptyline 50 mg 2 tablets every  night, Emgality, Inderal as preventive medications,  Suboptimal response to Relpax, will try Maxalt 10 mg as needed Obstructive sleep apnea  Referral to sleep study     Marcial Pacas, M.D. Ph.D.  [facility] [address] [place], [place] [address] Ph: [phone] Fax: [phone]  CC: Suella Broad, FNP

## 2021-04-27 NOTE — Telephone Encounter (Signed)
PA Case: [id number], Status: Approved, Coverage Starts on: 03/19/2021 12:00:00 AM, Coverage Ends on: 03/18/2022 12:00:00 AM. Questions? Contact [phone].

## 2021-05-02 ENCOUNTER — Other Ambulatory Visit: Payer: Self-pay

## 2021-05-02 ENCOUNTER — Other Ambulatory Visit: Payer: Self-pay | Admitting: Neurology

## 2021-05-02 NOTE — Telephone Encounter (Signed)
Rx refilled.

## 2021-05-10 ENCOUNTER — Other Ambulatory Visit: Payer: Self-pay | Admitting: Family Medicine

## 2021-05-10 DIAGNOSIS — M25561 Pain in right knee: Secondary | ICD-10-CM

## 2021-05-22 ENCOUNTER — Other Ambulatory Visit: Payer: Self-pay | Admitting: Neurology

## 2021-05-22 DIAGNOSIS — G43711 Chronic migraine without aura, intractable, with status migrainosus: Secondary | ICD-10-CM

## 2021-05-22 NOTE — Telephone Encounter (Signed)
Rx refilled.

## 2021-05-24 ENCOUNTER — Ambulatory Visit
Admission: RE | Admit: 2021-05-24 | Discharge: 2021-05-24 | Disposition: A | Discharge status: Home / Self Care | Payer: Medicare HMO | Source: Ambulatory Visit | Attending: Family Medicine | Admitting: Family Medicine

## 2021-05-24 DIAGNOSIS — M25561 Pain in right knee: Secondary | ICD-10-CM

## 2021-07-24 ENCOUNTER — Encounter: Payer: Self-pay | Admitting: Physical Medicine & Rehabilitation

## 2021-08-17 ENCOUNTER — Telehealth: Payer: Self-pay | Admitting: Neurology

## 2021-08-17 NOTE — Telephone Encounter (Signed)
Pt states she has been 2 months without her Galcanezumab-gnlm (EMGALITY) 120 MG/ML SOAJ, Pt states her insurance will not pay for it.  Pt would like to know if Dr Terrace Arabia will consider putting her on another medication.

## 2021-08-17 NOTE — Telephone Encounter (Signed)
I did research on this per note from Arther Abbott, RN    3:26 PM Note PA Case: [id number], Status: Approved, Coverage Starts on: 03/19/2021 12:00:00 AM, Coverage Ends on: 03/18/2022 12:00:00 AM. Questions? Contact [phone].    I called pharmacy and spoke with Olegario Messier- she the original claim submitted was for a 90 day and insurance would not pay. She changed claim to a 30 day and refill went through with no problem and she verified the pt would have no copay. She sts she will get this med ready for the pt.  Call pt and updated on this she verbalized understanding and appreciation for the call. She will call back if any other issues come up on this.

## 2021-09-01 ENCOUNTER — Encounter: Payer: Self-pay | Admitting: Physical Medicine & Rehabilitation

## 2021-09-01 ENCOUNTER — Encounter: Payer: Medicare HMO | Attending: Physical Medicine & Rehabilitation | Admitting: Physical Medicine & Rehabilitation

## 2021-09-01 VITALS — BP 116/79 | HR 102 | Ht 68.0 in | Wt 389.8 lb

## 2021-09-01 DIAGNOSIS — G6289 Other specified polyneuropathies: Secondary | ICD-10-CM | POA: Insufficient documentation

## 2021-09-01 DIAGNOSIS — G5603 Carpal tunnel syndrome, bilateral upper limbs: Secondary | ICD-10-CM | POA: Insufficient documentation

## 2021-09-01 DIAGNOSIS — M797 Fibromyalgia: Secondary | ICD-10-CM | POA: Diagnosis present

## 2021-09-01 NOTE — Progress Notes (Unsigned)
Subjective:    Patient ID: Tracy Banks, female    DOB: 1980/08/24, 41 y.o.   MRN: [id number]  HPI 41 year old female with past medical history of migraine headaches, acid reflux, fibromyalgia, obesity , lupus, overactive bladder, peripheral neuropathy here for evaluation of her chronic pain.  Patient reports she has pain throughout her body and that everything hurts.  She reports currently one of her worst pains is in her hands.  She has pain in the first 3 digits of her bilateral hands in addition to numbness in these fingers.  Additionally she reports her ankles hurt and she had a small surgery on a neuroma on her right foot in the past and also a prior significant surgery on her left foot with multiple plates placed.  Patient says she had the surgeries for very flat feet.  Patient reports she has had multiple cortisone injections in her feet.  She reports chronic neuropathy in her feet but reports the pain from this is well controlled.  She has pain in her left foot around the ankle and pain in her right foot as well.  Patient thinks she has carpal tunnel in her bilateral hands but is not able to provide significant details. She reports gabapentin and Flexeril did not help her pain much. Patient reports she has used Voltaren gel and this does not help.  Tylenol and NSAIDs did not help.  She takes duloxetine 90 mg, Lyrica 150 mg twice daily, nortriptyline 100 mg daily, tramadol.  She reports these medications are beneficial but she continues to have severe pain.  She reports decreased activity at home.  Patient has been seen by neurology for her peripheral neuropathy and headache.  In 2021 she had a normal EMG study.  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  She reports she was seen by rheumatology for lupus and is on Plaquenil. She has not done physical therapy recently.    Pain Inventory Average Pain  9 Pain Right Now 9 My pain is sharp, stabbing, and aching  In the last 24 hours, has pain interfered with the following? General activity 8 Relation with others 8 Enjoyment of life 8 What TIME of day is your pain at its worst? morning , daytime, evening, and night Sleep (in general) Fair  Pain is worse with: walking, bending, sitting, standing, and some activites Pain improves with: injections Relief from Meds:  na  walk without assistance how many minutes can you walk? 5-10 ability to climb steps?  yes do you drive?  yes  employed # of hrs/week 28 not employed: date last employed 2023 disabled: date disabled 2020  bladder control problems weakness tingling spasms depression anxiety  Any changes since last visit?  no  Paliwal, Himanshu, MD    Family History  Problem Relation Age of Onset   Sarcoidosis Mother    Migraines Mother    Hypertension Mother    Osteoarthritis Mother    Diabetes Mother    Fibromyalgia Mother    Healthy Father    Social History   Socioeconomic History   Marital status: Single    Spouse name: Not on file   Number of children: 3   Years of education: college   Highest education level: Not on file  Occupational History   Not on file  Tobacco Use   Smoking status: Every Day    Types: Cigarettes   Smokeless tobacco:  Never   Tobacco comments:    1 pack per week  Substance and Sexual Activity   Alcohol use: Yes    Comment: occ   Drug use: No   Sexual activity: Not on file  Other Topics Concern   Not on file  Social History Narrative   Lives at home with children.   Right-handed.   Caffeine use:  One can soda per day.   Social Determinants of Health   Financial Resource Strain: Not on file  Food Insecurity: Not on file  Transportation Needs: Not on file  Physical Activity: Not on file  Stress: Not on file  Social Connections: Not on file   Past Surgical History:  Procedure Laterality Date   ANKLE SURGERY Bilateral     NEUROMA SURGERY     Past Medical History:  Diagnosis Date   Acid reflux    Arthritis    Asthma    Carpal tunnel syndrome    Eczema    Fibromyalgia    Gout    Migraine    Overactive bladder    Polyarthralgia    BP 116/79   Pulse (!) 102   Ht 5\' 8"  (1.727 m)   Wt (!) 389 lb 12.8 oz (176.8 kg)   SpO2 99%   BMI 59.27 kg/m   Opioid Risk Score:   Fall Risk Score:  `1  Depression screen [facility] 2/9     09/01/2021    2:07 PM  Depression screen PHQ 2/9  Decreased Interest 3  Down, Depressed, Hopeless 3  PHQ - 2 Score 6  Altered sleeping 2  Tired, decreased energy 2  Change in appetite 1  Feeling bad or failure about yourself  2  Trouble concentrating 2  Moving slowly or fidgety/restless 1  Suicidal thoughts 1  PHQ-9 Score 17     Review of Systems  Constitutional: Negative.   HENT: Negative.    Eyes: Negative.   Respiratory: Negative.    Cardiovascular:  Positive for leg swelling.  Gastrointestinal: Negative.   Endocrine: Negative.   Genitourinary:  Positive for difficulty urinating.  Musculoskeletal:  Positive for back pain.       Shoulders hands knees and feet bil/ spasms  Skin: Negative.   Allergic/Immunologic: Negative.   Neurological:  Positive for weakness.       Tingling  Hematological: Negative.   Psychiatric/Behavioral:  Positive for behavioral problems and dysphoric mood.        Sometimes thinks about hurting herself as in cutting, but not suicidal  All other systems reviewed and are negative.      Objective:   Physical Exam  Gen: no distress, normal appearing HEENT: oral mucosa pink and moist, NCAT Cardio: Reg rate Chest: normal effort, normal rate of breathing Abd: soft, non-distended, obese Psych: pleasant, normal affect Skin: intact Neuro: Alert and oriented follows commands answers questions appropriately, cranial nerves II through XII intact.  No pronator drift.  No ataxia or dysmetria.  Reflexes 1 and symmetric at ankles, knees,  triceps, biceps, BR, no clonus Patient intact to light touch in all 4 extremities however patient reports it is altered in digits 1 through 3 of her bilateral hands Tinel's positive at both wrists, Phalen's and reverse Phalen's negative Strength 5/5 throughout Musculoskeletal:  Bilateral pain with palpation of both knees Decreased lumbar and cervical motion in all directions Pain with palpation of both shoulders but worse on the left Tenderness with palpation of bilateral ankles Tenderness at bilateral trapezius Tenderness throughout paraspinals of  the cervical thoracic and lumbar spine Pain with abduction of her left shoulder Muscle bulk and tone are normal Facet loading is equivocal Slump test and SLR negative however caused her knee and hip pain Bilateral pes planus, surgical scars noted on both feet  Knee Right 05/24/21 IMPRESSION: 1. No acute or evidence of prior fracture. 2. Minimal early degenerative change.   L spine xray 03/31/21 No recent fracture is seen. Alignment of posterior margins of vertebral bodies is unremarkable. There is disc space narrowing at L5-S1 level. Degenerative changes are noted in facet joints in the lower lumbar spine. There is mild levoscoliosis. There are multiple calcific densities in the right upper quadrant suggesting gallbladder stones.   IMPRESSION: No recent fracture is seen. Alignment of posterior margins of vertebral bodies is unremarkable. There is disc space narrowing at L5-S1 level.    Assessment & Plan:  Fibromyalgia -I suspect that the majority of her pain is related to her diagnosis of fibromyalgia.  Her pain is widespread throughout all her limbs and spine and I think the presentation is most can consistent with fibromyalgia.  She also has evidence of OA in her knee on the right, history of bilateral foot surgeries, lupus, degenerative disc disease and facet joint changes in her lumbar spine.    -She is currently on multiple  medications such as Lyrica duloxetine and nortriptyline that can be beneficial to fibromyalgia. -I think she would benefit from low impact progressive exercise, she has not been to the physical therapy in a while and I think this would be the next step to attempt to improve her pain. -PT referral placed -Prescription provided for TENS unit  Peripheral neuropathy -History of small fiber neuropathy this appears to be well controlled on current medications, she has been followed by neurology  Carpal tunnel syndrome -One of her primary complaints was the numbness and pain over her digits 1 through 3 of her bilateral hands -Advised her to do stretch for carpal tunnel syndrome, night splint prescription provided -Consult placed for EMG Carpal tunnel syndrome

## 2021-09-03 ENCOUNTER — Encounter: Payer: Self-pay | Admitting: Physical Medicine & Rehabilitation

## 2021-09-13 NOTE — Addendum Note (Signed)
Addended by: Fanny Dance on: 09/13/2021 07:43 PM   Modules accepted: Orders

## 2021-09-15 NOTE — Therapy (Unsigned)
OUTPATIENT PHYSICAL THERAPY THORACOLUMBAR EVALUATION   Patient Name: Tracy Banks MRN: [id number] DOB:16-Dec-1980, 41 y.o., female Today's Date: 09/15/2021    Past Medical History:  Diagnosis Date   Acid reflux    Arthritis    Asthma    Carpal tunnel syndrome    Eczema    Fibromyalgia    Gout    Migraine    Overactive bladder    Polyarthralgia    Past Surgical History:  Procedure Laterality Date   ANKLE SURGERY Bilateral    NEUROMA SURGERY     Patient Active Problem List   Diagnosis Date Noted   Excessive sleepiness 04/27/2021   Intractable chronic migraine without aura and with status migrainosus 05/23/2020   Blurry vision 05/23/2020   Paresthesia 06/11/2019   Chronic migraine 05/05/2018    PCP: Maryagnes Amos, FNP  REFERRING PROVIDER: Fanny Dance, MD  REFERRING DIAG: M79.7 (ICD-10-CM) - Fibromyalgia   Rationale for Evaluation and Treatment Rehabilitation  THERAPY DIAG:  Fibromyalgia, global pain  ONSET DATE: chronic  SUBJECTIVE:                                                                                                                                                                                           SUBJECTIVE STATEMENT: *** PERTINENT HISTORY:  41 year old female with past medical history of migraine headaches, acid reflux, fibromyalgia, obesity , lupus, overactive bladder, peripheral neuropathy here for evaluation of her chronic pain.  Patient reports she has pain throughout her body and that everything hurts.  She reports currently one of her worst pains is in her hands.  She has pain in the first 3 digits of her bilateral hands in addition to numbness in these fingers.  Additionally she reports her ankles hurt and she had a small surgery on a neuroma on her right foot in the past and also a prior significant surgery on her left foot with multiple plates placed.  Patient says she had the surgeries for very flat feet.  Patient  reports she has had multiple cortisone injections in her feet.  She reports chronic neuropathy in her feet but reports the pain from this is well controlled.  She has pain in her left foot around the ankle and pain in her right foot as well.  Patient thinks she has carpal tunnel in her bilateral hands but is not able to provide significant details. She reports gabapentin and Flexeril did not help her pain much. Patient reports she has used Voltaren gel and this does not help.  Tylenol and NSAIDs did not help.  She takes duloxetine 90 mg, Lyrica 150 mg twice daily, nortriptyline  100 mg daily, tramadol.  She reports these medications are beneficial but she continues to have severe pain.  She reports decreased activity at home.  Patient has been seen by neurology for her peripheral neuropathy and headache.  In 2021 she had a normal EMG study.  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  She reports she was seen by rheumatology for lupus and is on Plaquenil. She has not done physical therapy recently.  PAIN:  Are you having pain? Yes: {yespain:27235::"NPRS scale: ***/10","Pain location: ***","Pain description: ***","Aggravating factors: ***","Relieving factors: ***"}   PRECAUTIONS: None  WEIGHT BEARING RESTRICTIONS No  FALLS:  Has patient fallen in last 6 months? No  LIVING ENVIRONMENT: Lives with: {OPRC lives with:25569::"lives with their family"} Lives in: {Lives in:25570} Stairs: {opstairs:27293} Has following equipment at home: {Assistive devices:23999}  OCCUPATION: ***  PLOF: {PLOF:24004}  PATIENT GOALS ***   OBJECTIVE:   DIAGNOSTIC FINDINGS:  ***  PATIENT SURVEYS:  {rehab surveys:24030}  SCREENING FOR RED FLAGS: Bowel or bladder incontinence: {Yes/No:304960894} Spinal tumors: {Yes/No:304960894} Cauda equina syndrome: {Yes/No:304960894} Compression fracture: {Yes/No:304960894} Abdominal  aneurysm: {Yes/No:304960894}  COGNITION:  Overall cognitive status: {cognition:24006}     SENSATION: {sensation:27233}  MUSCLE LENGTH: Hamstrings: Right *** deg; Left *** deg Thomas test: Right *** deg; Left *** deg  POSTURE: {posture:25561}  PALPATION: ***  LUMBAR ROM:   {AROM/PROM:27142}  A/PROM  eval  Flexion   Extension   Right lateral flexion   Left lateral flexion   Right rotation   Left rotation    (Blank rows = not tested)  LOWER EXTREMITY ROM:     {AROM/PROM:27142}  Right eval Left eval  Hip flexion    Hip extension    Hip abduction    Hip adduction    Hip internal rotation    Hip external rotation    Knee flexion    Knee extension    Ankle dorsiflexion    Ankle plantarflexion    Ankle inversion    Ankle eversion     (Blank rows = not tested)  LOWER EXTREMITY MMT:    MMT Right eval Left eval  Hip flexion    Hip extension    Hip abduction    Hip adduction    Hip internal rotation    Hip external rotation    Knee flexion    Knee extension    Ankle dorsiflexion    Ankle plantarflexion    Ankle inversion    Ankle eversion     (Blank rows = not tested)  LUMBAR SPECIAL TESTS:  {lumbar special test:25242}  FUNCTIONAL TESTS:  {Functional tests:24029}  GAIT: Distance walked: *** Assistive device utilized: {Assistive devices:23999} Level of assistance: {Levels of assistance:24026} Comments: ***    TODAY'S TREATMENT  ***   PATIENT EDUCATION:  Education details: *** Person educated: {Person educated:25204} Education method: {Education Method:25205} Education comprehension: {Education Comprehension:25206}   HOME EXERCISE PROGRAM: ***  ASSESSMENT:  CLINICAL IMPRESSION: Patient is a *** y.o. *** who was seen today for physical therapy evaluation and treatment for ***.    OBJECTIVE IMPAIRMENTS {opptimpairments:25111}.   ACTIVITY LIMITATIONS {activitylimitations:27494}  PARTICIPATION LIMITATIONS:  {participationrestrictions:25113}  PERSONAL FACTORS {Personal factors:25162} are also affecting patient's functional outcome.   REHAB POTENTIAL: {rehabpotential:25112}  CLINICAL DECISION MAKING: {clinical decision making:25114}  EVALUATION COMPLEXITY: {Evaluation complexity:25115}   GOALS: Goals reviewed with patient? {yes/no:20286}  SHORT TERM GOALS: Target date: {follow up:25551}  *** Baseline: Goal status: {GOALSTATUS:25110}  2.  *** Baseline:  Goal status: {GOALSTATUS:25110}  3.  *** Baseline:  Goal status: {GOALSTATUS:25110}  4.  *** Baseline:  Goal status: {GOALSTATUS:25110}  5.  *** Baseline:  Goal status: {GOALSTATUS:25110}  6.  *** Baseline:  Goal status: {GOALSTATUS:25110}  LONG TERM GOALS: Target date: {follow up:25551}  *** Baseline:  Goal status: {GOALSTATUS:25110}  2.  *** Baseline:  Goal status: {GOALSTATUS:25110}  3.  *** Baseline:  Goal status: {GOALSTATUS:25110}  4.  *** Baseline:  Goal status: {GOALSTATUS:25110}  5.  *** Baseline:  Goal status: {GOALSTATUS:25110}  6.  *** Baseline:  Goal status: {GOALSTATUS:25110}   PLAN: PT FREQUENCY: {rehab frequency:25116}  PT DURATION: {rehab duration:25117}  PLANNED INTERVENTIONS: {rehab planned interventions:25118::"Therapeutic exercises","Therapeutic activity","Neuromuscular re-education","Balance training","Gait training","Patient/Family education","Joint mobilization"}.  PLAN FOR NEXT SESSION: Lanice Shirts, PT 09/15/2021, 9:48 AM

## 2021-09-18 ENCOUNTER — Ambulatory Visit: Payer: Medicare HMO | Attending: Physical Medicine & Rehabilitation

## 2021-09-20 ENCOUNTER — Telehealth: Payer: Self-pay | Admitting: Neurology

## 2021-09-20 NOTE — Telephone Encounter (Signed)
Rescheduled 8/15 appointment with pt over the phone - NP out 

## 2021-09-28 ENCOUNTER — Other Ambulatory Visit (HOSPITAL_COMMUNITY): Payer: Self-pay | Admitting: Physical Medicine & Rehabilitation

## 2021-10-04 ENCOUNTER — Telehealth: Payer: Self-pay

## 2021-10-04 ENCOUNTER — Ambulatory Visit (INDEPENDENT_AMBULATORY_CARE_PROVIDER_SITE_OTHER): Payer: Medicare HMO | Admitting: Neurology

## 2021-10-04 ENCOUNTER — Encounter: Payer: Self-pay | Admitting: Neurology

## 2021-10-04 VITALS — BP 110/78 | HR 112 | Ht 68.0 in | Wt 389.0 lb

## 2021-10-04 DIAGNOSIS — R202 Paresthesia of skin: Secondary | ICD-10-CM | POA: Diagnosis not present

## 2021-10-04 DIAGNOSIS — G43711 Chronic migraine without aura, intractable, with status migrainosus: Secondary | ICD-10-CM

## 2021-10-04 DIAGNOSIS — G4710 Hypersomnia, unspecified: Secondary | ICD-10-CM | POA: Diagnosis not present

## 2021-10-04 MED ORDER — NORTRIPTYLINE HCL 50 MG PO CAPS: ORAL_CAPSULE | ORAL | 5 refills | Status: DC

## 2021-10-04 MED ORDER — EMGALITY 120 MG/ML ~~LOC~~ SOAJ: 140.0000 mg | SUBCUTANEOUS | 11 refills | Status: DC

## 2021-10-04 NOTE — Progress Notes (Addendum)
Patient: Tracy Banks Date of Birth: [birth year]  Reason for Visit: Follow up History from: Patient Primary Neurologist: Dr.Yan   ASSESSMENT AND PLAN 41 y.o. year old female   1.  Bilateral hand and feet burning pain, consistent with neuropathic pain 2.  Worsening hand tingling, weakness -Order bilateral upper NCV/EMG for worsening symptoms, r/o CTS -April 2021 EMG/NCV showed no large fiber peripheral neuropathy -Skin biopsy confirmed small fiber neuropathy -Rheumatology diagnosis connective tissue disease, started on Plaquenil -Continue Lyrica 150 mg 3 times daily -Continue nortriptyline 50 mg, 2 tablets at bedtime -Also on Cymbalta -Seeing pain management now, referred for physical therapy  3.  Worsening headache, risk for pseudotumor cerebri -Seeing ophthalmology next week -Will continue current medications including Topamax, propanolol, Emgality -MRI of the brain was normal  4.  Obstructive sleep apnea -Will schedule sleep consult  Addendum 10/17/21 SS: Had ophthalmology visit with Dr. Dione Booze 10/12/2021, no evidence of papilledema or increased ICP on exam, recommended weight loss and follow-up with neurology.  On hydroxychloroquine no toxicity or maculopathy noted.  HISTORY  Tracy Banks Is a 41 years old female, seen in request by her primary care nurse practitioner Maryagnes Amos, for evaluation of migraine headache,   I have reviewed and summarized the referring note from the referring physician.  She reported a history of migraine headaches since high school, but her migraine are lateralized severe pounding headache with associated light noise sensitivity, lasting for hours, going to sleep usually helps, she used to have migraine 2-3 times each month, trigger for her migraines are light, noise, smell from freshly cut grass, stress, sleep deprivation, weather changes, her son, mother also suffered migraines,   She also has history of obesity, has been on blood  pressures for over past 10 years, her weight is overall stable, there was no significant visual changes.   She complains of slow worsening migraine headaches over the past couple years, has been getting injection as a urgent abortive treatment at her primary care's office every 2 to 4 weeks, Imitrex provide limited help, Phenergan, sleep is usually helpful, she has been taking over-the-counter medication, in combination with Imitrex almost every other day to control her headache over the past few months.  She also had visual auras preceding her migraine, blurry vision, distortion of the colors,   She was started on Inderal 40 mg twice a day, Emagality since February 2020, no significant benefit noticed yet.  She is also on polypharmacy for fibromyalgia, Elavil 50 mg every night Cymbalta 60 mg daily, gabapentin 900 mg 3 times a day.   UPDATE June 11 2019: She was on Emgality for about a year, during that period of time her headache was under reasonable control, it was stopped since February 2021, since then, she noticed increased to headache, 2-3 times headache daily, bilateral temporal parietal region pounding headache, with light, noise sensitivity, she has tried Maxalt, without helping her headaches   She is also on preventive medication Topamax, Inderal, she complains of difficulty sleeping, had frequent headaches when trying to go to sleep   In addition, she complains of bilateral hands and toes numbness tingling since 2020, intermittent involving all toes, and all 5 fingers, it hurt when she bear weight, she also complains of right-sided neck pain, radiating pain to right shoulder   She denies persistent upper or lower extremity sensory loss, weakness, incontinence   UPDATE July 08 2019: Patient return for electrodiagnostic study today, which was essentially normal, there was no evidence  of large fiber peripheral neuropathy or bilateral lumbosacral radiculopathy   She complains 2 years  history of intermittent patchy skin lesions, a year history of bilateral fingertips and the feet burning pain,   Laboratory evaluations on June 11, 2019 showed positive ANA, double-stranded DNA 39, elevated ESR of 54, CMP showed mild elevated creatinine 1.05, otherwise normal CBC, protein electrophoresis, vitamin B12, HIV, RPR, TSH,   She has been taking gabapentin up to 900 mg 3 times daily without helping her neuropathic pain significantly, also on Cymbalta 60 mg twice daily, has never tried Lyrica   UPDATE May 23 2020: She was seen by rheumatologist Dr. Lenn Sink on October 22, 2019, did have some sun sensitive skin rash, antiphospholipid antibody, elevated ESR, low positive double-stranded DNA with all the other labs for lupus being negative Positive p-ANCA but all other labs for vasculitis was negative Slight elevated CK of unknown clinical significance   She was diagnosed with connective tissue disorder, was started on Plaquenil, which did help her symptoms some, Also had a skin biopsy in May 2021, informed with diagnosis of small fiber neuropathy,   She continue complains of headache up to 5 times each week, frequent stabbing pain, sometimes longer moderate steady pain couple times a week with light noise sensitivity, nauseous, Zomig did not help her symptoms   She did complains of slow worsening blurry vision, noted over 40 pounds weight gain over past 10 months, she has not seen by ophthalmologist for many months   UPDATE Feb 9th 2023: Her insurance changed at the beginning of 2023, no longer cover her aimovig, even while she was taking aimovig, she continues to have almost daily moderate headache, bilateral frontal region, as if somebody has punched her forehead, severe pain last about 10 minutes, followed by lower degree of pressure pain lasting for few hours, waxing and wanes throughout the day  She is no longer taking abortive treatment, tried Relpax, does not help, she denies  frequent over-the-counter medication use  For preventive medications, she is nowtaking Topamax 100 mg twice a day, nortriptyline 50 mg 2 at bedtime, propanolol 40 mg twice a day, she works as a Scientist, water quality, continue battling weight problem, also began to noticed blurry vision, taking her a while to refocus  Personally reviewed MRI brain in May 2022 that was normal  Laboratory evaluation in 2021 showed positive ANA, and panANCA, taking Plaquenil   She also complains of excessive daytime fatigue, sleepiness, loud snoring  Update October 04, 2021 SS: Feels more weakness to hands, right more than left, harder to open things, zip her pants, tingling in finger tips 1-3 fingers bilaterally. Impaired touch sensation. Was suggested may have carpal tunnel from pain management, just established, was referred for physical therapy.  She works part-time as a Scientist, water quality at the schedule.  Continues with daily headache, is on Emgality, nortriptyline, Lyrica, propanolol, Topamax.  Indicates Maxalt was not helpful for headache.  Also on Cymbalta, Plaquenil, tramadol.  Seeing ophthalmology next week.  Needs to schedule sleep study   REVIEW OF SYSTEMS: Out of a complete 14 system review of symptoms, the patient complains only of the following symptoms, and all other reviewed systems are negative.  See HPI  ALLERGIES: Allergies  Allergen Reactions   Penicillins Palpitations    Rapid heart beat Has patient had a PCN reaction causing immediate rash, facial/tongue/throat swelling, SOB or lightheadedness with hypotension: no Has patient had a PCN reaction causing severe rash involving mucus membranes or skin necrosis: no Has  patient had a PCN reaction that required hospitalization unknown Has patient had a PCN reaction occurring within the last 10 years: no If all of the above answers are "NO", then may proceed with Cephalosporin use.  Rapid heart beat Has patient had a PCN reaction causing immediate rash,  facial/tongue/throat swelling, SOB or lightheadedness with hypotension: no Has patient had a PCN reaction causing severe rash involving mucus membranes or skin necrosis: no Has patient had a PCN reaction that required hospitalization unknown Has patient had a PCN reaction occurring within the last 10 years: no If all of the above answers are "NO", then may proceed with Cephalosporin use. Rapid heart beat Has patient had a PCN reaction causing immediate rash, facial/tongue/throat swelling, SOB or lightheadedness with hypotension: no Has patient had a PCN reaction causing severe rash involving mucus membranes or skin necrosis: no Has patient had a PCN reaction that required hospitalization unknown Has patient had a PCN reaction occurring within the last 10 years: no If all of the above answers are "NO", then may proceed with Cephalosporin use.    HOME MEDICATIONS: Outpatient Medications Prior to Visit  Medication Sig Dispense Refill   Cholecalciferol ([employer] VITAMIN D3) 25 MCG (1000 UT) CHEW Chew by mouth.     DULoxetine (CYMBALTA) 30 MG capsule 30 mg daily. Takes with 60 mg daily     DULoxetine (CYMBALTA) 60 MG capsule 60 mg daily. Takes with 30 mg daily     esomeprazole (NEXIUM) 40 MG capsule Take 40 mg by mouth daily at 12 noon.     fluticasone (FLOVENT HFA) 110 MCG/ACT inhaler Inhale 2 puffs into the lungs 2 (two) times daily.     hydroxychloroquine (PLAQUENIL) 200 MG tablet Take 1 tablet by mouth 2 (two) times daily.     Nabumetone (RELAFEN DS) 1000 MG TABS Take 1 tablet by mouth daily.     pregabalin (LYRICA) 150 MG capsule TAKE 1 CAPSULE(150 MG) BY MOUTH THREE TIMES DAILY 90 capsule 5   propranolol (INDERAL) 40 MG tablet TAKE 1 TABLET(40 MG) BY MOUTH TWICE DAILY 180 tablet 3   tolterodine (DETROL LA) 4 MG 24 hr capsule Take 4 mg by mouth daily.     topiramate (TOPAMAX) 100 MG tablet One in am/ 2 at night 270 tablet 4   torsemide (DEMADEX) 20 MG tablet Take 20 mg by mouth daily.      traMADol (ULTRAM) 50 MG tablet Take 50 mg by mouth daily.     ziprasidone (GEODON) 20 MG capsule SMARTSIG:1 Capsule(s) By Mouth Every Evening     EMGALITY 120 MG/ML SOAJ Inject 140 mg into the skin.     nortriptyline (PAMELOR) 50 MG capsule TAKE 2 CAPSULES(100 MG) BY MOUTH AT BEDTIME 60 capsule 5   Erenumab-aooe (AIMOVIG) 140 MG/ML SOAJ Inject 140 mg into the skin every 30 (thirty) days. 1.12 mL 11   No facility-administered medications prior to visit.    PAST MEDICAL HISTORY: Past Medical History:  Diagnosis Date   Acid reflux    Arthritis    Asthma    Carpal tunnel syndrome    Eczema    Fibromyalgia    Gout    Migraine    Overactive bladder    Polyarthralgia     PAST SURGICAL HISTORY: Past Surgical History:  Procedure Laterality Date   ANKLE SURGERY Bilateral    NEUROMA SURGERY      FAMILY HISTORY: Family History  Problem Relation Age of Onset   Sarcoidosis Mother    Migraines  Mother    Hypertension Mother    Osteoarthritis Mother    Diabetes Mother    Fibromyalgia Mother    Healthy Father     SOCIAL HISTORY: Social History   Socioeconomic History   Marital status: Single    Spouse name: Not on file   Number of children: 3   Years of education: college   Highest education level: Not on file  Occupational History   Not on file  Tobacco Use   Smoking status: Every Day    Types: Cigarettes   Smokeless tobacco: Never   Tobacco comments:    1 pack per week  Substance and Sexual Activity   Alcohol use: Yes    Comment: occ   Drug use: No   Sexual activity: Not on file  Other Topics Concern   Not on file  Social History Narrative   Lives at home with children.   Right-handed.   Caffeine use:  One can soda per day.   Social Determinants of Health   Financial Resource Strain: Not on file  Food Insecurity: Not on file  Transportation Needs: Not on file  Physical Activity: Not on file  Stress: Not on file  Social Connections: Not on file  Intimate  Partner Violence: Not on file    PHYSICAL EXAM  Vitals:   10/04/21 1538  BP: 110/78  Pulse: (!) 112  Weight: (!) 389 lb (176.4 kg)  Height: _0  (1.727 m)   Body mass index is 59.15 kg/m.  Generalized: Well developed, in no acute distress, obese Neurological examination  Mentation: Alert oriented to time, place, history taking. Follows all commands speech and language fluent Cranial nerve II-XII: Pupils were equal round reactive to light. Extraocular movements were full, visual field were full on confrontational test. Facial sensation and strength were normal. Head turning and shoulder shrug were normal and symmetric. Motor: Good strength all extremities, with grip strength, poor effort, positive tinel's sign bilaterally  Sensory: Sensory testing is intact to soft touch on all 4 extremities. No evidence of extinction is noted.  Coordination: Cerebellar testing reveals good finger-nose-finger and heel-to-shin bilaterally.  Gait and station: Gait is steady, limited by large body habitus Reflexes: Deep tendon reflexes are symmetric but decreased  DIAGNOSTIC DATA (LABS, IMAGING, TESTING) - I reviewed patient records, labs, notes, testing and imaging myself where available.  Lab Results  Component Value Date   WBC 7.3 06/11/2019   HGB 13.6 06/11/2019   HCT 41.8 06/11/2019   MCV 84 06/11/2019   PLT 228 12/02/2017      Component Value Date/Time   NA 143 06/11/2019 1024   K 4.5 06/11/2019 1024   CL 108 (H) 06/11/2019 1024   CO2 22 06/11/2019 1024   GLUCOSE 85 06/11/2019 1024   GLUCOSE 97 12/02/2017 0841   BUN 11 06/11/2019 1024   CREATININE 1.05 (H) 06/11/2019 1024   CALCIUM 9.3 06/11/2019 1024   PROT 7.2 06/11/2019 1024   ALBUMIN 4.1 06/11/2019 1024   AST 29 06/11/2019 1024   ALT 28 06/11/2019 1024   ALKPHOS 95 06/11/2019 1024   BILITOT 0.2 06/11/2019 1024   GFRNONAA 66 06/11/2019 1024   GFRAA 76 06/11/2019 1024   No results found for: "CHOL", "HDL", "LDLCALC",  "LDLDIRECT", "TRIG", "CHOLHDL" Lab Results  Component Value Date   HGBA1C 5.3 06/11/2019   Lab Results  Component Value Date   JTTSVXBL39 030 06/11/2019   Lab Results  Component Value Date   TSH 1.400 06/11/2019  Butler Denmark, AGNP-C, DNP 10/04/2021, 4:19 PM [facility] [address] [place], [place] [address] [phone]

## 2021-10-04 NOTE — Telephone Encounter (Signed)
   If patient calls to request a sleep referral, please let her know under further investigation, it is apparent that she is in bad dept with our office and she will need to take care of that dept before we can schedule a sleep consult appt with her.

## 2021-10-04 NOTE — Patient Instructions (Signed)
I will order the nerve conduction  Keep other medications Please see your eye doctor  Set up sleep study  See you back in 6 months

## 2021-10-05 ENCOUNTER — Telehealth: Payer: Self-pay | Admitting: Neurology

## 2021-10-05 NOTE — Telephone Encounter (Signed)
Error

## 2021-10-12 ENCOUNTER — Telehealth: Payer: Self-pay | Admitting: Neurology

## 2021-10-12 NOTE — Telephone Encounter (Signed)
Order for NCV/EMG sent to [facility] [phone]. 

## 2021-10-23 ENCOUNTER — Telehealth: Payer: Self-pay

## 2021-10-23 NOTE — Telephone Encounter (Signed)
PA for emgality  (Key: [id number])  This request has been approved.  Please note any additional information provided by [facility] at the bottom of your screen. Your request has been approved PA Case: [id number], Status: Approved, Coverage Starts on: 03/19/2021 12:00:00 AM, Coverage Ends on: 03/18/2022 12:00:00 AM. Questions? Contact [phone].

## 2021-10-24 ENCOUNTER — Encounter: Payer: Medicare HMO | Attending: Physical Medicine & Rehabilitation | Admitting: Physical Medicine & Rehabilitation

## 2021-10-24 DIAGNOSIS — M797 Fibromyalgia: Secondary | ICD-10-CM | POA: Insufficient documentation

## 2021-10-24 DIAGNOSIS — G6289 Other specified polyneuropathies: Secondary | ICD-10-CM | POA: Insufficient documentation

## 2021-10-24 DIAGNOSIS — G5603 Carpal tunnel syndrome, bilateral upper limbs: Secondary | ICD-10-CM | POA: Insufficient documentation

## 2021-10-31 ENCOUNTER — Ambulatory Visit: Payer: Medicare HMO | Admitting: Neurology

## 2021-11-14 ENCOUNTER — Telehealth: Payer: Self-pay

## 2021-11-14 NOTE — Telephone Encounter (Signed)
Pt preferred to keep appointment that is already scheduled with [facility]. Pt did not want to wait until November to have it done here

## 2021-11-14 NOTE — Telephone Encounter (Signed)
-----   Message from Glean Salvo, NP sent at 11/14/2021 12:28 PM EDT ----- Can we offer her EMG slot with Dr. Terrace Arabia, per Dr. Zannie Cove message. I think was sent to [facility], ok to move appointment to our office cancel with [facility] if patient prefers. Thanks ----- Message ----- From: Levert Feinstein, MD Sent: 11/14/2021   9:20 AM EDT To: Glean Salvo, NP; Meryl Crutch, CMA  EMG was ordered on July 19th, but patient is not on schedule, please put her on schedule ----- Message ----- From: Glean Salvo, NP Sent: 10/04/2021   4:31 PM EDT To: Levert Feinstein, MD  Can you do her EMG study?

## 2021-12-12 ENCOUNTER — Encounter: Payer: Medicare HMO | Attending: Physical Medicine & Rehabilitation | Admitting: Physical Medicine & Rehabilitation

## 2021-12-12 ENCOUNTER — Encounter: Payer: Self-pay | Admitting: Physical Medicine & Rehabilitation

## 2021-12-12 DIAGNOSIS — R202 Paresthesia of skin: Secondary | ICD-10-CM | POA: Insufficient documentation

## 2021-12-12 NOTE — Patient Instructions (Signed)
Dr Marciano Sequin will discuss results with you

## 2021-12-12 NOTE — Progress Notes (Signed)
EMG/NCV of bilateral upper extremities requested to evaluate radial 3 digit numbness.  EMG report scanned under media tab. Patient will follow-up with referring physician to discuss results

## 2021-12-18 ENCOUNTER — Encounter: Payer: Medicare HMO | Attending: Physical Medicine & Rehabilitation | Admitting: Physical Medicine & Rehabilitation

## 2021-12-18 DIAGNOSIS — R202 Paresthesia of skin: Secondary | ICD-10-CM | POA: Insufficient documentation

## 2021-12-28 NOTE — Progress Notes (Signed)
Surgery orders requested via Epic inbox. °

## 2021-12-28 NOTE — Patient Instructions (Addendum)
SURGICAL WAITING ROOM VISITATION Patients having surgery or a procedure may have no more than 2 support people in the waiting area - these visitors may rotate.   Children under the age of 32 must have an adult with them who is not the patient. If the patient needs to stay at the hospital during part of their recovery, the visitor guidelines for inpatient rooms apply. Pre-op nurse will coordinate an appropriate time for 1 support person to accompany patient in pre-op.  This support person may not rotate.    Please refer to the [facility] website for the visitor guidelines for Inpatients (after your surgery is over and you are in a regular room).      Your procedure is scheduled on: 01-04-22   Report to [facility] Main Entrance    Report to admitting at 11:15 AM   Call this number if you have problems the morning of surgery [phone]   Do not eat food :After Midnight.   After Midnight you may have the following liquids until 10:30 AM DAY OF SURGERY  Water Non-Citrus Juices (without pulp, NO RED) Carbonated Beverages Black Coffee (NO MILK/CREAM OR CREAMERS, sugar ok)  Clear Tea (NO MILK/CREAM OR CREAMERS, sugar ok) regular and decaf                             Plain Jell-O (NO RED)                                           Fruit ices (not with fruit pulp, NO RED)                                     Popsicles (NO RED)                                                               Sports drinks like Gatorade (NO RED)                   The day of surgery:  Drink ONE (1) Pre-Surgery Clear Ensure at 10:30 AM the morning of surgery. Drink in one sitting. Do not sip.  This drink was given to you during your hospital  pre-op appointment visit. Nothing else to drink after completing the Pre-Surgery Clear Ensure           If you have questions, please contact your surgeon's office.   FOLLOW  ANY ADDITIONAL PRE OP INSTRUCTIONS YOU RECEIVED FROM YOUR SURGEON'S OFFICE!!!      Oral Hygiene is also important to reduce your risk of infection.                                    Remember - BRUSH YOUR TEETH THE MORNING OF SURGERY WITH YOUR REGULAR TOOTHPASTE   Do NOT smoke after Midnight   Take these medicines the morning of surgery with A SIP OF WATER:   Bupropion  Duloxetine  Propranolol  Rosuvastatin  Tolterodine  Topiramate  Geodon  Tramadol if needed                              You may not have any metal on your body including hair pins, jewelry, and body piercing             Do not wear make-up, lotions, powders, perfumes or deodorant  Do not wear nail polish including gel and S&S, artificial/acrylic nails, or any other type of covering on natural nails including finger and toenails. If you have artificial nails, gel coating, etc. that needs to be removed by a nail salon please have this removed prior to surgery or surgery may need to be canceled/ delayed if the surgeon/ anesthesia feels like they are unable to be safely monitored.   Do not shave  48 hours prior to surgery.    Do not bring valuables to the hospital. [facility] IS NOT RESPONSIBLE   FOR VALUABLES.   Contacts, dentures or bridgework may not be worn into surgery.  DO NOT BRING YOUR HOME MEDICATIONS TO THE HOSPITAL. PHARMACY WILL DISPENSE MEDICATIONS LISTED ON YOUR MEDICATION LIST TO YOU DURING YOUR ADMISSION IN THE HOSPITAL!    Patients discharged on the day of surgery will not be allowed to drive home.  Someone NEEDS to stay with you for the first 24 hours after anesthesia.              Please read over the following fact sheets you were given: IF YOU HAVE QUESTIONS ABOUT YOUR PRE-OP INSTRUCTIONS PLEASE CALL [phone] Gwen  If you received a COVID test during your pre-op visit  it is requested that you wear a mask when out in public, stay away from anyone that may not be feeling well and notify your surgeon if you develop symptoms. If you test positive for Covid or have been in  contact with anyone that has tested positive in the last 10 days please notify you surgeon.  [facility] - Preparing for Surgery Before surgery, you can play an important role.  Because skin is not sterile, your skin needs to be as free of germs as possible.  You can reduce the number of germs on your skin by washing with CHG (chlorahexidine gluconate) soap before surgery.  CHG is an antiseptic cleaner which kills germs and bonds with the skin to continue killing germs even after washing. Please DO NOT use if you have an allergy to CHG or antibacterial soaps.  If your skin becomes reddened/irritated stop using the CHG and inform your nurse when you arrive at Short Stay. Do not shave (including legs and underarms) for at least 48 hours prior to the first CHG shower.  You may shave your face/neck.  Please follow these instructions carefully:  1.  Shower with CHG Soap the night before surgery and the  morning of surgery.  2.  If you choose to wash your hair, wash your hair first as usual with your normal  shampoo.  3.  After you shampoo, rinse your hair and body thoroughly to remove the shampoo.                             4.  Use CHG as you would any other liquid soap.  You can apply chg directly to the skin and wash.  Gently with a scrungie or clean washcloth.  5.  Apply  the CHG Soap to your body ONLY FROM THE NECK DOWN.   Do   not use on face/ open                           Wound or open sores. Avoid contact with eyes, ears mouth and   genitals (private parts).                       Wash face,  Genitals (private parts) with your normal soap.             6.  Wash thoroughly, paying special attention to the area where your    surgery  will be performed.  7.  Thoroughly rinse your body with warm water from the neck down.  8.  DO NOT shower/wash with your normal soap after using and rinsing off the CHG Soap.                9.  Pat yourself dry with a clean towel.            10.  Wear clean pajamas.             11.  Place clean sheets on your bed the night of your first shower and do not  sleep with pets. Day of Surgery : Do not apply any lotions/deodorants the morning of surgery.  Please wear clean clothes to the hospital/surgery center.  FAILURE TO FOLLOW THESE INSTRUCTIONS MAY RESULT IN THE CANCELLATION OF YOUR SURGERY  PATIENT SIGNATURE_________________________________  NURSE SIGNATURE__________________________________  ________________________________________________________________________       Adam Phenix  An incentive spirometer is a tool that can help keep your lungs clear and active. This tool measures how well you are filling your lungs with each breath. Taking long deep breaths may help reverse or decrease the chance of developing breathing (pulmonary) problems (especially infection) following: A long period of time when you are unable to move or be active. BEFORE THE PROCEDURE  If the spirometer includes an indicator to show your best effort, your nurse or respiratory therapist will set it to a desired goal. If possible, sit up straight or lean slightly forward. Try not to slouch. Hold the incentive spirometer in an upright position. INSTRUCTIONS FOR USE  Sit on the edge of your bed if possible, or sit up as far as you can in bed or on a chair. Hold the incentive spirometer in an upright position. Breathe out normally. Place the mouthpiece in your mouth and seal your lips tightly around it. Breathe in slowly and as deeply as possible, raising the piston or the ball toward the top of the column. Hold your breath for 3-5 seconds or for as long as possible. Allow the piston or ball to fall to the bottom of the column. Remove the mouthpiece from your mouth and breathe out normally. Rest for a few seconds and repeat Steps 1 through 7 at least 10 times every 1-2 hours when you are awake. Take your time and take a few normal breaths between deep breaths. The  spirometer may include an indicator to show your best effort. Use the indicator as a goal to work toward during each repetition. After each set of 10 deep breaths, practice coughing to be sure your lungs are clear. If you have an incision (the cut made at the time of surgery), support your incision when coughing by placing a pillow or rolled up towels  firmly against it. Once you are able to get out of bed, walk around indoors and cough well. You may stop using the incentive spirometer when instructed by your caregiver.  RISKS AND COMPLICATIONS Take your time so you do not get dizzy or light-headed. If you are in pain, you may need to take or ask for pain medication before doing incentive spirometry. It is harder to take a deep breath if you are having pain. AFTER USE Rest and breathe slowly and easily. It can be helpful to keep track of a log of your progress. Your caregiver can provide you with a simple table to help with this. If you are using the spirometer at home, follow these instructions: [place] IF:  You are having difficultly using the spirometer. You have trouble using the spirometer as often as instructed. Your pain medication is not giving enough relief while using the spirometer. You develop fever of 100.5 F (38.1 C) or higher. SEEK IMMEDIATE MEDICAL CARE IF:  You cough up bloody sputum that had not been present before. You develop fever of 102 F (38.9 C) or greater. You develop worsening pain at or near the incision site. MAKE SURE YOU:  Understand these instructions. Will watch your condition. Will get help right away if you are not doing well or get worse. Document Released: 07/16/2006 Document Revised: 05/28/2011 Document Reviewed: 09/16/2006 [facility] Patient Information 2014 [place], [place].   ________________________________________________________________________

## 2022-01-02 ENCOUNTER — Other Ambulatory Visit: Payer: Self-pay

## 2022-01-02 ENCOUNTER — Encounter (HOSPITAL_COMMUNITY): Payer: Self-pay | Admitting: *Deleted

## 2022-01-02 ENCOUNTER — Encounter (HOSPITAL_COMMUNITY)
Admission: RE | Admit: 2022-01-02 | Discharge: 2022-01-02 | Disposition: A | Discharge status: Home / Self Care | Payer: Medicare HMO | Source: Ambulatory Visit | Attending: Specialist | Admitting: Specialist

## 2022-01-02 VITALS — BP 131/75 | HR 96 | Temp 97.8°F | Resp 20 | Ht 69.0 in | Wt 386.6 lb

## 2022-01-02 DIAGNOSIS — D649 Anemia, unspecified: Secondary | ICD-10-CM | POA: Diagnosis not present

## 2022-01-02 DIAGNOSIS — I2510 Atherosclerotic heart disease of native coronary artery without angina pectoris: Secondary | ICD-10-CM | POA: Diagnosis not present

## 2022-01-02 DIAGNOSIS — Z01818 Encounter for other preprocedural examination: Secondary | ICD-10-CM | POA: Diagnosis present

## 2022-01-02 HISTORY — DX: Depression, unspecified: F32.A

## 2022-01-02 HISTORY — DX: Anemia, unspecified: D64.9

## 2022-01-02 HISTORY — DX: Pneumonia, unspecified organism: J18.9

## 2022-01-02 HISTORY — DX: Anxiety disorder, unspecified: F41.9

## 2022-01-02 HISTORY — DX: Essential (primary) hypertension: I10

## 2022-01-02 LAB — BASIC METABOLIC PANEL
Anion gap: 6 (ref 5–15)
BUN: 13 mg/dL (ref 6–20)
CO2: 25 mmol/L (ref 22–32)
Calcium: 8.4 mg/dL — ABNORMAL LOW (ref 8.9–10.3)
Chloride: 112 mmol/L — ABNORMAL HIGH (ref 98–111)
Creatinine, Ser: 1.25 mg/dL — ABNORMAL HIGH (ref 0.44–1.00)
GFR, Estimated: 55 mL/min — ABNORMAL LOW (ref >60)
Glucose, Bld: 96 mg/dL (ref 70–99)
Potassium: 4.3 mmol/L (ref 3.5–5.1)
Sodium: 143 mmol/L (ref 135–145)

## 2022-01-02 LAB — CBC
HCT: 39.1 % (ref 36.0–46.0)
Hemoglobin: 11.7 g/dL — ABNORMAL LOW (ref 12.0–15.0)
MCH: 25.2 pg — ABNORMAL LOW (ref 26.0–34.0)
MCHC: 29.9 g/dL — ABNORMAL LOW (ref 30.0–36.0)
MCV: 84.1 fL (ref 80.0–100.0)
Platelets: 193 10*3/uL (ref 150–400)
RBC: 4.65 MIL/uL (ref 3.87–5.11)
RDW: 17.1 % — ABNORMAL HIGH (ref 11.5–15.5)
WBC: 10.1 10*3/uL (ref 4.0–10.5)
nRBC: 0 % (ref 0.0–0.2)

## 2022-01-02 NOTE — H&P (View-Only) (Signed)
Baribed requested with portable for surgery on 01-04-22.

## 2022-01-02 NOTE — Progress Notes (Signed)
Baribed requested with portable for surgery on 01-04-22. 

## 2022-01-02 NOTE — Progress Notes (Signed)
COVID Vaccine Completed:  Yes  Date of COVID positive in last 90 days:  No  PCP - [facility] Cardiologist - N/A  Chest x-ray - N/A EKG - 01-02-22 Epic Stress Test - N/A ECHO - N/A Cardiac Cath - N/A Pacemaker/ICD device last checked: Spinal Cord Stimulator:N/A  Bowel Prep - N/A  Sleep Study - N/A CPAP -   Fasting Blood Sugar - N/A Checks Blood Sugar _____ times a day  Blood Thinner Instructions:  N/A Aspirin Instructions: Last Dose:  Activity level:  Can go up a flight of stairs and perform activities of daily living without stopping and without symptoms of chest pain or shortness of breath.  Some limitations due to knee pain  Anesthesia review: N/A  Patient denies shortness of breath, fever, cough and chest pain at PAT appointment  Patient verbalized understanding of instructions that were given to them at the PAT appointment. Patient was also instructed that they will need to review over the PAT instructions again at home before surgery.

## 2022-01-03 ENCOUNTER — Encounter (HOSPITAL_COMMUNITY): Payer: Self-pay | Admitting: Specialist

## 2022-01-04 ENCOUNTER — Ambulatory Visit (HOSPITAL_COMMUNITY)
Admission: RE | Admit: 2022-01-04 | Discharge: 2022-01-04 | Disposition: A | Discharge status: Home / Self Care | Payer: Medicare HMO | Attending: Specialist | Admitting: Specialist

## 2022-01-04 ENCOUNTER — Other Ambulatory Visit: Payer: Self-pay

## 2022-01-04 ENCOUNTER — Ambulatory Visit (HOSPITAL_COMMUNITY): Payer: Medicare HMO | Admitting: Anesthesiology

## 2022-01-04 ENCOUNTER — Encounter (HOSPITAL_COMMUNITY)
Admission: RE | Disposition: A | Discharge status: Home / Self Care | Payer: Self-pay | Source: Home / Self Care | Attending: Specialist

## 2022-01-04 ENCOUNTER — Encounter (HOSPITAL_COMMUNITY): Payer: Self-pay | Admitting: Specialist

## 2022-01-04 ENCOUNTER — Ambulatory Visit (HOSPITAL_BASED_OUTPATIENT_CLINIC_OR_DEPARTMENT_OTHER): Payer: Medicare HMO | Admitting: Anesthesiology

## 2022-01-04 DIAGNOSIS — I10 Essential (primary) hypertension: Secondary | ICD-10-CM | POA: Insufficient documentation

## 2022-01-04 DIAGNOSIS — Z6841 Body Mass Index (BMI) 40.0 and over, adult: Secondary | ICD-10-CM | POA: Diagnosis not present

## 2022-01-04 DIAGNOSIS — Z01818 Encounter for other preprocedural examination: Secondary | ICD-10-CM

## 2022-01-04 DIAGNOSIS — Z79899 Other long term (current) drug therapy: Secondary | ICD-10-CM | POA: Insufficient documentation

## 2022-01-04 DIAGNOSIS — M1990 Unspecified osteoarthritis, unspecified site: Secondary | ICD-10-CM | POA: Insufficient documentation

## 2022-01-04 DIAGNOSIS — F418 Other specified anxiety disorders: Secondary | ICD-10-CM | POA: Insufficient documentation

## 2022-01-04 DIAGNOSIS — S83271A Complex tear of lateral meniscus, current injury, right knee, initial encounter: Secondary | ICD-10-CM | POA: Diagnosis not present

## 2022-01-04 DIAGNOSIS — F17210 Nicotine dependence, cigarettes, uncomplicated: Secondary | ICD-10-CM | POA: Insufficient documentation

## 2022-01-04 DIAGNOSIS — F419 Anxiety disorder, unspecified: Secondary | ICD-10-CM | POA: Diagnosis not present

## 2022-01-04 DIAGNOSIS — M23200 Derangement of unspecified lateral meniscus due to old tear or injury, right knee: Secondary | ICD-10-CM

## 2022-01-04 DIAGNOSIS — M2241 Chondromalacia patellae, right knee: Secondary | ICD-10-CM | POA: Insufficient documentation

## 2022-01-04 DIAGNOSIS — N3281 Overactive bladder: Secondary | ICD-10-CM | POA: Diagnosis not present

## 2022-01-04 DIAGNOSIS — M797 Fibromyalgia: Secondary | ICD-10-CM | POA: Insufficient documentation

## 2022-01-04 DIAGNOSIS — S83281A Other tear of lateral meniscus, current injury, right knee, initial encounter: Secondary | ICD-10-CM | POA: Insufficient documentation

## 2022-01-04 DIAGNOSIS — W06XXXA Fall from bed, initial encounter: Secondary | ICD-10-CM | POA: Diagnosis not present

## 2022-01-04 HISTORY — PX: CHONDROPLASTY: SHX5177

## 2022-01-04 HISTORY — PX: KNEE ARTHROSCOPY WITH LATERAL MENISECTOMY: SHX6193

## 2022-01-04 LAB — POCT PREGNANCY, URINE: Preg Test, Ur: NEGATIVE

## 2022-01-04 SURGERY — ARTHROSCOPY, KNEE, WITH LATERAL MENISCECTOMY
Anesthesia: General | Site: Knee | Laterality: Right

## 2022-01-04 MED ORDER — BUPIVACAINE HCL (PF) 0.25 % IJ SOLN
INTRAMUSCULAR | Status: DC | PRN
  Administered 2022-01-04: 23 mL

## 2022-01-04 MED ORDER — MIDAZOLAM HCL 2 MG/2ML IJ SOLN
INTRAMUSCULAR | Status: AC
  Filled 2022-01-04: qty 2

## 2022-01-04 MED ORDER — SUGAMMADEX SODIUM 500 MG/5ML IV SOLN: INTRAVENOUS | Status: DC | PRN

## 2022-01-04 MED ORDER — OXYCODONE HCL 5 MG PO TABS: 5.0000 mg | ORAL_TABLET | ORAL | 0 refills | Status: DC | PRN

## 2022-01-04 MED ORDER — CEFAZOLIN IN SODIUM CHLORIDE 3-0.9 GM/100ML-% IV SOLN
3.0000 g | INTRAVENOUS | Status: AC
  Administered 2022-01-04: 3 g via INTRAVENOUS
  Filled 2022-01-04: qty 100

## 2022-01-04 MED ORDER — SUGAMMADEX SODIUM 500 MG/5ML IV SOLN
INTRAVENOUS | Status: AC
  Filled 2022-01-04: qty 5

## 2022-01-04 MED ORDER — PROPOFOL 10 MG/ML IV BOLUS
INTRAVENOUS | Status: AC
  Filled 2022-01-04: qty 20

## 2022-01-04 MED ORDER — LACTATED RINGERS IV SOLN: INTRAVENOUS | Status: DC

## 2022-01-04 MED ORDER — ONDANSETRON HCL 4 MG PO TABS: 4.0000 mg | ORAL_TABLET | Freq: Three times a day (TID) | ORAL | 1 refills | Status: DC | PRN

## 2022-01-04 MED ORDER — MIDAZOLAM HCL 5 MG/5ML IJ SOLN
INTRAMUSCULAR | Status: DC | PRN
  Administered 2022-01-04: 2 mg via INTRAVENOUS

## 2022-01-04 MED ORDER — DEXMEDETOMIDINE HCL IN NACL 80 MCG/20ML IV SOLN
INTRAVENOUS | Status: AC
  Filled 2022-01-04: qty 20

## 2022-01-04 MED ORDER — SODIUM CHLORIDE 0.9 % IR SOLN
Status: DC | PRN
  Administered 2022-01-04: 6000 mL

## 2022-01-04 MED ORDER — TRIAMCINOLONE ACETONIDE 40 MG/ML IJ SUSP
INTRAMUSCULAR | Status: DC | PRN
  Administered 2022-01-04: 80 mg

## 2022-01-04 MED ORDER — ROCURONIUM BROMIDE 10 MG/ML (PF) SYRINGE
PREFILLED_SYRINGE | INTRAVENOUS | Status: DC | PRN
  Administered 2022-01-04: 70 mg via INTRAVENOUS

## 2022-01-04 MED ORDER — SUCCINYLCHOLINE CHLORIDE 200 MG/10ML IV SOSY
PREFILLED_SYRINGE | INTRAVENOUS | Status: DC | PRN
  Administered 2022-01-04: 180 mg via INTRAVENOUS

## 2022-01-04 MED ORDER — DEXMEDETOMIDINE HCL IN NACL 80 MCG/20ML IV SOLN
INTRAVENOUS | Status: DC | PRN
  Administered 2022-01-04 (×3): 8 ug via BUCCAL

## 2022-01-04 MED ORDER — CEPHALEXIN 500 MG PO CAPS: 500.0000 mg | ORAL_CAPSULE | Freq: Four times a day (QID) | ORAL | 0 refills | Status: AC

## 2022-01-04 MED ORDER — HYDROMORPHONE HCL 1 MG/ML IJ SOLN: 0.2500 mg | INTRAMUSCULAR | Status: DC | PRN

## 2022-01-04 MED ORDER — PROPOFOL 10 MG/ML IV BOLUS
INTRAVENOUS | Status: DC | PRN
  Administered 2022-01-04: 200 mg via INTRAVENOUS

## 2022-01-04 MED ORDER — METHOCARBAMOL 500 MG PO TABS: 500.0000 mg | ORAL_TABLET | Freq: Four times a day (QID) | ORAL | 0 refills | Status: DC

## 2022-01-04 MED ORDER — ROCURONIUM BROMIDE 10 MG/ML (PF) SYRINGE
PREFILLED_SYRINGE | INTRAVENOUS | Status: AC
  Filled 2022-01-04: qty 10

## 2022-01-04 MED ORDER — 0.9 % SODIUM CHLORIDE (POUR BTL) OPTIME
TOPICAL | Status: DC | PRN
  Administered 2022-01-04: 1000 mL

## 2022-01-04 MED ORDER — TRIAMCINOLONE ACETONIDE 40 MG/ML IJ SUSP
INTRAMUSCULAR | Status: AC
  Filled 2022-01-04: qty 1

## 2022-01-04 MED ORDER — OXYCODONE HCL 5 MG PO TABS: 5.0000 mg | ORAL_TABLET | Freq: Once | ORAL | Status: AC | PRN

## 2022-01-04 MED ORDER — CHLORHEXIDINE GLUCONATE 0.12 % MT SOLN
15.0000 mL | Freq: Once | OROMUCOSAL | Status: AC
  Administered 2022-01-04: 15 mL via OROMUCOSAL

## 2022-01-04 MED ORDER — SUCCINYLCHOLINE CHLORIDE 200 MG/10ML IV SOSY
PREFILLED_SYRINGE | INTRAVENOUS | Status: AC
  Filled 2022-01-04: qty 10

## 2022-01-04 MED ORDER — SUGAMMADEX SODIUM 500 MG/5ML IV SOLN
INTRAVENOUS | Status: DC | PRN
  Administered 2022-01-04: 350 mg via INTRAVENOUS

## 2022-01-04 MED ORDER — FENTANYL CITRATE (PF) 100 MCG/2ML IJ SOLN
INTRAMUSCULAR | Status: DC | PRN
  Administered 2022-01-04: 100 ug via INTRAVENOUS

## 2022-01-04 MED ORDER — OXYCODONE HCL 5 MG/5ML PO SOLN: 5.0000 mg | Freq: Once | ORAL | Status: AC | PRN

## 2022-01-04 MED ORDER — ORAL CARE MOUTH RINSE: 15.0000 mL | Freq: Once | OROMUCOSAL | Status: AC

## 2022-01-04 MED ORDER — LIDOCAINE 2% (20 MG/ML) 5 ML SYRINGE
INTRAMUSCULAR | Status: DC | PRN
  Administered 2022-01-04: 100 mg via INTRAVENOUS

## 2022-01-04 MED ORDER — ONDANSETRON HCL 4 MG/2ML IJ SOLN: 4.0000 mg | Freq: Once | INTRAMUSCULAR | Status: DC | PRN

## 2022-01-04 MED ORDER — OXYCODONE HCL 5 MG PO TABS
ORAL_TABLET | ORAL | Status: AC
  Administered 2022-01-04: 5 mg via ORAL
  Filled 2022-01-04: qty 1

## 2022-01-04 MED ORDER — ONDANSETRON HCL 4 MG/2ML IJ SOLN
INTRAMUSCULAR | Status: DC | PRN
  Administered 2022-01-04: 4 mg via INTRAVENOUS

## 2022-01-04 MED ORDER — BUPIVACAINE HCL (PF) 0.25 % IJ SOLN
INTRAMUSCULAR | Status: AC
  Filled 2022-01-04: qty 30

## 2022-01-04 MED ORDER — FENTANYL CITRATE (PF) 100 MCG/2ML IJ SOLN
INTRAMUSCULAR | Status: AC
  Filled 2022-01-04: qty 2

## 2022-01-04 MED ORDER — DEXAMETHASONE SODIUM PHOSPHATE 10 MG/ML IJ SOLN
INTRAMUSCULAR | Status: DC | PRN
  Administered 2022-01-04: 10 mg via INTRAVENOUS

## 2022-01-04 SURGICAL SUPPLY — 36 items
BAG COUNTER SPONGE SURGICOUNT (BAG) ×2 IMPLANT
BANDAGE ESMARK 6X9 LF (GAUZE/BANDAGES/DRESSINGS) ×2 IMPLANT
BLADE EXCALIBUR 4.0X13 (MISCELLANEOUS) IMPLANT
BNDG ELASTIC 6X15 VLCR STRL LF (GAUZE/BANDAGES/DRESSINGS) IMPLANT
BNDG ESMARK 6X9 LF (GAUZE/BANDAGES/DRESSINGS) ×2
BNDG GAUZE DERMACEA FLUFF 4 (GAUZE/BANDAGES/DRESSINGS) ×2 IMPLANT
CNTNR URN SCR LID CUP LEK RST (MISCELLANEOUS) IMPLANT
CONT SPEC 4OZ STRL OR WHT (MISCELLANEOUS)
CUFF TOURN SGL QUICK 34 (TOURNIQUET CUFF)
CUFF TRNQT CYL 34X4.125X (TOURNIQUET CUFF) ×4 IMPLANT
DRAPE INCISE IOBAN 66X45 STRL (DRAPES) ×2 IMPLANT
DRAPE U-SHAPE 47X51 STRL (DRAPES) ×2 IMPLANT
DURAPREP 26ML APPLICATOR (WOUND CARE) ×2 IMPLANT
DW OUTFLOW CASSETTE/TUBE SET (MISCELLANEOUS) ×2 IMPLANT
EXCALIBUR 3.8MM X 13CM (MISCELLANEOUS) ×2 IMPLANT
GAUZE 4X4 16PLY ~~LOC~~+RFID DBL (SPONGE) ×2 IMPLANT
GAUZE PAD ABD 8X10 STRL (GAUZE/BANDAGES/DRESSINGS) ×4 IMPLANT
GAUZE SPONGE 4X4 12PLY STRL (GAUZE/BANDAGES/DRESSINGS) ×2 IMPLANT
GAUZE XEROFORM 1X8 LF (GAUZE/BANDAGES/DRESSINGS) ×2 IMPLANT
GLOVE BIO SURGEON STRL SZ7 (GLOVE) ×2 IMPLANT
GLOVE BIO SURGEON STRL SZ8 (GLOVE) ×2 IMPLANT
GLOVE BIOGEL PI IND STRL 7.5 (GLOVE) ×2 IMPLANT
GLOVE INDICATOR 8.0 STRL GRN (GLOVE) ×2 IMPLANT
IV NS IRRIG 3000ML ARTHROMATIC (IV SOLUTION) ×4 IMPLANT
KIT BASIN OR (CUSTOM PROCEDURE TRAY) ×2 IMPLANT
MANIFOLD NEPTUNE II (INSTRUMENTS) ×2 IMPLANT
NEEDLE HYPO 22GX1.5 SAFETY (NEEDLE) ×2 IMPLANT
PACK ARTHROSCOPY DSU (CUSTOM PROCEDURE TRAY) ×2 IMPLANT
PAD ARMBOARD 7.5X6 YLW CONV (MISCELLANEOUS) IMPLANT
SUT ETHILON 4 0 PS 2 18 (SUTURE) ×2 IMPLANT
SYR CONTROL 10ML LL (SYRINGE) ×2 IMPLANT
TOWEL OR 17X26 10 PK STRL BLUE (TOWEL DISPOSABLE) ×2 IMPLANT
TUBING ARTHROSCOPY IRRIG 16FT (MISCELLANEOUS) IMPLANT
TUBING CONNECTING 10 (TUBING) IMPLANT
WAND APOLLORF SJ50 AR-9845 (SURGICAL WAND) ×2 IMPLANT
WATER STERILE IRR 500ML POUR (IV SOLUTION) ×2 IMPLANT

## 2022-01-04 NOTE — Anesthesia Postprocedure Evaluation (Signed)
Anesthesia Post Note  Patient: Tracy Banks  Procedure(s) Performed: KNEE ARTHROSCOPY WITH PARTIAL LATERAL MENISECTOMY (Right: Knee) KNEE ARTHROSCOPY CHONDROPLASTY (Right)     Patient location during evaluation: PACU Anesthesia Type: General Level of consciousness: awake and alert and oriented Pain management: pain level controlled Vital Signs Assessment: post-procedure vital signs reviewed and stable Respiratory status: spontaneous breathing, nonlabored ventilation and respiratory function stable Cardiovascular status: blood pressure returned to baseline and stable Postop Assessment: no apparent nausea or vomiting Anesthetic complications: no   No notable events documented.  Last Vitals:  Vitals:   01/04/22 1515 01/04/22 1538  BP: 116/79   Pulse:    Resp: (!) 22   Temp:  36.5 C  SpO2:      Last Pain:  Vitals:   01/04/22 1457  TempSrc:   PainSc: 0-No pain                 Jasiah Buntin A.

## 2022-01-04 NOTE — Interval H&P Note (Signed)
History and Physical Interval Note:  01/04/2022 1:36 PM  Tracy Banks  has presented today for surgery, with the diagnosis of Right lateral meniscus tear, chondral flaps.  The various methods of treatment have been discussed with the patient and family. After consideration of risks, benefits and other options for treatment, the patient has consented to  Procedure(s) with comments: KNEE ARTHROSCOPY WITH PARTIAL LATERAL MENISECTOMY (Right) - knee block 60 KNEE ARTHROSCOPY CHONDROPLASTY (Right) - knee block  60 as a surgical intervention.  The patient's history has been reviewed, patient examined, no change in status, stable for surgery.  I have reviewed the patient's chart and labs.  Questions were answered to the patient's satisfaction.     Bobbie Virden ANDREW

## 2022-01-04 NOTE — H&P (Signed)
Tracy Banks is an 41 y.o. female.   Chief Complaint: Right knee pain HPI: Pleasant 41 year old female who presented to the office back in December.  She fell off her bed and landed on her knee.  She has had pain on and off since then.  She has received a cortisone injection with very minimal relief.  She had an MRI scan done that showed a lateral meniscus tear.  Surgical versus nonsurgical management was discussed with the patient in the office and she is elected to proceed with surgical management at this time.  Past Medical History:  Diagnosis Date   Acid reflux    Anemia    Anxiety    Arthritis    Asthma    Carpal tunnel syndrome    Depression    Eczema    Fibromyalgia    Gout    Hypertension    Migraine    Overactive bladder    Pneumonia    Polyarthralgia     Past Surgical History:  Procedure Laterality Date   ANKLE SURGERY Bilateral    LEG SURGERY Right    Muscle stretched   NEUROMA SURGERY      Family History  Problem Relation Age of Onset   Sarcoidosis Mother    Migraines Mother    Hypertension Mother    Osteoarthritis Mother    Diabetes Mother    Fibromyalgia Mother    Healthy Father    Social History:  reports that she has been smoking cigarettes. She has a 0.50 pack-year smoking history. She has never used smokeless tobacco. She reports current alcohol use. She reports that she does not use drugs.  Allergies:  Allergies  Allergen Reactions   Penicillins Palpitations    Rapid heart beat Has patient had a PCN reaction causing immediate rash, facial/tongue/throat swelling, SOB or lightheadedness with hypotension: no Has patient had a PCN reaction causing severe rash involving mucus membranes or skin necrosis: no Has patient had a PCN reaction that required hospitalization unknown Has patient had a PCN reaction occurring within the last 10 years: no If all of the above answers are "NO", then may proceed with Cephalosporin use.  Rapid heart beat Has  patient had a PCN reaction causing immediate rash, facial/tongue/throat swelling, SOB or lightheadedness with hypotension: no Has patient had a PCN reaction causing severe rash involving mucus membranes or skin necrosis: no Has patient had a PCN reaction that required hospitalization unknown Has patient had a PCN reaction occurring within the last 10 years: no If all of the above answers are "NO", then may proceed with Cephalosporin use. Rapid heart beat Has patient had a PCN reaction causing immediate rash, facial/tongue/throat swelling, SOB or lightheadedness with hypotension: no Has patient had a PCN reaction causing severe rash involving mucus membranes or skin necrosis: no Has patient had a PCN reaction that required hospitalization unknown Has patient had a PCN reaction occurring within the last 10 years: no If all of the above answers are "NO", then may proceed with Cephalosporin use. Other reaction(s): Tachycardia    Medications Prior to Admission  Medication Sig Dispense Refill   buPROPion (WELLBUTRIN XL) 150 MG 24 hr tablet Take 150 mg by mouth daily.     Cholecalciferol ([employer] VITAMIN D3) 25 MCG (1000 UT) CHEW Chew by mouth.     DULoxetine (CYMBALTA) 30 MG capsule Take 30 mg by mouth See admin instructions. Takes with 60 mg for a total of 90 mg daily     DULoxetine (CYMBALTA) 60  MG capsule Take 60 mg by mouth See admin instructions. Takes with 30 mg for a total of 90 mg daily     EMGALITY 120 MG/ML SOAJ Inject 140 mg into the skin every 30 (thirty) days. (Patient taking differently: Inject 120 mg into the skin every 30 (thirty) days.) 1.12 mL 11   meloxicam (MOBIC) 7.5 MG tablet Take 7.5 mg by mouth daily.     nortriptyline (PAMELOR) 50 MG capsule TAKE 2 CAPSULES(100 MG) BY MOUTH AT BEDTIME 60 capsule 5   pregabalin (LYRICA) 150 MG capsule TAKE 1 CAPSULE(150 MG) BY MOUTH THREE TIMES DAILY 90 capsule 5   propranolol (INDERAL) 40 MG tablet TAKE 1 TABLET(40 MG) BY MOUTH TWICE DAILY 180  tablet 3   rosuvastatin (CRESTOR) 10 MG tablet Take 10 mg by mouth daily.     tolterodine (DETROL LA) 4 MG 24 hr capsule Take 4 mg by mouth daily.     topiramate (TOPAMAX) 100 MG tablet One in am/ 2 at night (Patient taking differently: Take 100 mg by mouth 2 (two) times daily.) 270 tablet 4   torsemide (DEMADEX) 20 MG tablet Take 20 mg by mouth daily.     traMADol (ULTRAM) 50 MG tablet Take 50 mg by mouth daily.     ziprasidone (GEODON) 20 MG capsule Take 20 mg by mouth See admin instructions. Take with 80 mg for a total of 100 mg daily     ziprasidone (GEODON) 80 MG capsule Take 80 mg by mouth See admin instructions. Take with 20 mg for a total of 100 mg daily      Results for orders placed or performed during the hospital encounter of 01/04/22 (from the past 48 hour(s))  Pregnancy, urine POC     Status: None   Collection Time: 01/04/22 12:16 PM  Result Value Ref Range   Preg Test, Ur NEGATIVE NEGATIVE    Comment:        THE SENSITIVITY OF THIS METHODOLOGY IS >24 mIU/mL    No results found.  Review of Systems  All other systems reviewed and are negative.   Blood pressure 126/88, pulse 98, temperature 98.5 F (36.9 C), temperature source Oral, resp. rate 18, height 5\' 9"  (1.753 m), weight (!) 175.3 kg, last menstrual period 12/05/2021, SpO2 99 %. Physical Exam Constitutional:      Appearance: Normal appearance.  Musculoskeletal:     Comments: On exam of her right knee no skin changes or effusions noted. She does have tenderness to palpation of the medial, lateral patellofemoral joint space. She has full extension, full flexion. Crepitus under the patella with extension and flexion. No laxity varus valgus pressure. Calf is supple. 5 out of 5 strength with resisted knee flexion and extension. Neurovascularly intact in her right lower extremity. Positive medial McMurray's on the lateral and medial side. Procedure Documentation   Skin:    Capillary Refill: Capillary refill takes less  than 2 seconds.  Neurological:     General: No focal deficit present.     Mental Status: She is alert and oriented to person, place, and time.  Psychiatric:        Mood and Affect: Mood normal.        Behavior: Behavior normal.        Thought Content: Thought content normal.        Judgment: Judgment normal.      Assessment/Plan Right knee lateral meniscus tear: MRI scan confirms a right knee lateral meniscus tear.  Surgical versus nonsurgical  management was discussed.  Risks and benefits of surgery was discussed with the patient.  Risks include bleeding, infection, damage to ligaments tendons blood vessels nerves and bones.  She understands all of these.  She also understands the risks associated with anesthesia.  She has elected to proceed with surgical management at this time.  She will have a right knee arthroscopy partial lateral meniscectomy.  All questions encouraged and answered for the patient in office today.  Drue Novel, [place] [facility] [phone] 01/04/2022, 12:50 PM

## 2022-01-04 NOTE — Op Note (Signed)
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 Procedure #1 right knee arthroscopic partial meniscectomy #2 chondroplasty of patella femoral trochlea lateral femoral condyle and lateral tibial plateau. Surgeon Hart Robinsons, MD Assistant Elenor Legato, PA-C Anesthesia General Estimated blood loss minimal Drains none Tourniquet time none Complications none Disposition PACU stable.  Operative details Patient was counseled in the holding area correct side identified marked chart reviewed.  Taken the operating room placed supine position under general anesthesia.  IV antibiotics were given within 1 hour of the surgical incision time.  Right knee arthroscopic portal established inferomedial inferolateral.  Patellofemoral joint revealed grade III chondromalacia patella and femoral trochlea with unstable chondral flaps disillusionment For a light chondroplasty on patella and femoral trochlea.  ACL PCL were intact medial compartment was inspected articular cartilage was healthy as was the medial meniscus suprapatellar pouch medial gutters unremarkable lateral side was inspected complex tear involving the entire lateral meniscus Baskets motorized shaver and part cautery a partial meniscectomy was performed back to stable base probably beveled and contoured.  There was also grade III chondromalacia lateral femoral condyle and lateral tibial plateau Chondroplasty was performed there was a shaver back to stable base.  There is no other abnormalities noted.  Irrigated arthroscopically was removed.  Portals closed with 4 nylon suture.  Another 10 cc of Sensorcaine was placed into the skin.  Sterile dressings applied ice pack no complications or problems awakened taken from the operating room to the PACU in stable condition..  Believe viscosupplementation for  her would be helpful after surgery and ultimately total knee arthroplasty but she will need to lose a considerable amount of weight first.

## 2022-01-04 NOTE — Anesthesia Preprocedure Evaluation (Signed)
Anesthesia Evaluation  Patient identified by MRN, date of birth, ID band Patient awake    Reviewed: Allergy & Precautions, NPO status , Patient's Chart, lab work & pertinent test results, reviewed documented beta blocker date and time   Airway Mallampati: III  TM Distance: >3 FB Neck ROM: Full    Dental no notable dental hx. (+) Teeth Intact, Dental Advisory Given   Pulmonary asthma , pneumonia, resolved, Current Smoker and Patient abstained from smoking.,    Pulmonary exam normal breath sounds clear to auscultation       Cardiovascular hypertension, Pt. on medications and Pt. on home beta blockers  Rhythm:Regular Rate:Normal     Neuro/Psych  Headaches, PSYCHIATRIC DISORDERS Anxiety Depression  Neuromuscular disease    GI/Hepatic Neg liver ROS, GERD  Medicated,  Endo/Other  Morbid obesityGout Hyperlipidemia  Renal/GU negative Renal ROS Bladder dysfunction  OAB    Musculoskeletal  (+) Arthritis , Osteoarthritis,  Fibromyalgia -Right lateral meniscus tear with chondral flaps   Abdominal (+) + obese,   Peds  Hematology  (+) Blood dyscrasia, anemia ,   Anesthesia Other Findings   Reproductive/Obstetrics                             Anesthesia Physical Anesthesia Plan  ASA: 3  Anesthesia Plan: General   Post-op Pain Management: Minimal or no pain anticipated, Dilaudid IV, Tylenol PO (pre-op)* and Precedex   Induction: Intravenous  PONV Risk Score and Plan: 3 and Treatment may vary due to age or medical condition, Midazolam, Ondansetron and Dexamethasone  Airway Management Planned: LMA and Oral ETT  Additional Equipment: None  Intra-op Plan:   Post-operative Plan: Extubation in OR  Informed Consent: I have reviewed the patients History and Physical, chart, labs and discussed the procedure including the risks, benefits and alternatives for the proposed anesthesia with the patient or  authorized representative who has indicated his/her understanding and acceptance.     Dental advisory given  Plan Discussed with: CRNA and Anesthesiologist  Anesthesia Plan Comments:         Anesthesia Quick Evaluation

## 2022-01-04 NOTE — Anesthesia Procedure Notes (Signed)
Procedure Name: Intubation Date/Time: 01/04/2022 1:50 PM  Performed by: Lavina Hamman, CRNAPre-anesthesia Checklist: Patient identified, Emergency Drugs available, Suction available, Patient being monitored and Timeout performed Patient Re-evaluated:Patient Re-evaluated prior to induction Oxygen Delivery Method: Circle system utilized Preoxygenation: Pre-oxygenation with 100% oxygen Induction Type: IV induction Ventilation: Mask ventilation without difficulty Laryngoscope Size: Mac and 3 Grade View: Grade I Tube type: Oral Tube size: 7.0 mm Number of attempts: 1 Airway Equipment and Method: Stylet Placement Confirmation: ETT inserted through vocal cords under direct vision, positive ETCO2, CO2 detector and breath sounds checked- equal and bilateral Secured at: 21 cm Tube secured with: Tape Dental Injury: Teeth and Oropharynx as per pre-operative assessment  Comments: ATOI, teeth and piercings unchanged.

## 2022-01-04 NOTE — Transfer of Care (Signed)
Immediate Anesthesia Transfer of Care Note  Patient: Tracy Banks  Procedure(s) Performed: KNEE ARTHROSCOPY WITH PARTIAL LATERAL MENISECTOMY (Right: Knee) KNEE ARTHROSCOPY CHONDROPLASTY (Right)  Patient Location: PACU  Anesthesia Type:General  Level of Consciousness: oriented, drowsy and patient cooperative  Airway & Oxygen Therapy: Patient Spontanous Breathing and Patient connected to face mask oxygen  Post-op Assessment: Report given to RN and Post -op Vital signs reviewed and stable  Post vital signs: Reviewed  Last Vitals:  Vitals Value Taken Time  BP 119/78 01/04/22 1500  Temp    Pulse 86 01/04/22 1500  Resp 18 01/04/22 1500  SpO2 100 % 01/04/22 1500  Vitals shown include unvalidated device data.  Last Pain:  Vitals:   01/04/22 1235  TempSrc:   PainSc: 0-No pain         Complications: No notable events documented.

## 2022-01-05 ENCOUNTER — Encounter (HOSPITAL_COMMUNITY): Payer: Self-pay | Admitting: Specialist

## 2022-01-15 ENCOUNTER — Encounter: Payer: Medicare HMO | Admitting: Physical Medicine & Rehabilitation

## 2022-01-18 ENCOUNTER — Encounter: Payer: Medicare HMO | Attending: Physical Medicine & Rehabilitation | Admitting: Physical Medicine & Rehabilitation

## 2022-01-18 ENCOUNTER — Encounter: Payer: Self-pay | Admitting: Physical Medicine & Rehabilitation

## 2022-01-18 VITALS — BP 129/85 | HR 105 | Ht 69.0 in | Wt 389.2 lb

## 2022-01-18 DIAGNOSIS — G6289 Other specified polyneuropathies: Secondary | ICD-10-CM | POA: Insufficient documentation

## 2022-01-18 DIAGNOSIS — M797 Fibromyalgia: Secondary | ICD-10-CM | POA: Insufficient documentation

## 2022-01-18 DIAGNOSIS — G5603 Carpal tunnel syndrome, bilateral upper limbs: Secondary | ICD-10-CM | POA: Diagnosis not present

## 2022-01-18 MED ORDER — MILNACIPRAN HCL 50 MG PO TABS: 50.0000 mg | ORAL_TABLET | Freq: Two times a day (BID) | ORAL | Status: DC

## 2022-01-18 NOTE — Progress Notes (Signed)
Subjective:    Patient ID: Tracy Banks, female    DOB: Nov 12, 1980, 41 y.o.   MRN: [id number]  HPI HPI 09/01/21 41 year old female with past medical history of migraine headaches, acid reflux, fibromyalgia, obesity , lupus, overactive bladder, peripheral neuropathy here for evaluation of her chronic pain.  Patient reports she has pain throughout her body and that everything hurts.  She reports currently one of her worst pains is in her hands.  She has pain in the first 3 digits of her bilateral hands in addition to numbness in these fingers.  Additionally she reports her ankles hurt and she had a small surgery on a neuroma on her right foot in the past and also a prior significant surgery on her left foot with multiple plates placed.  Patient says she had the surgeries for very flat feet.  Patient reports she has had multiple cortisone injections in her feet.  She reports chronic neuropathy in her feet but reports the pain from this is well controlled.  She has pain in her left foot around the ankle and pain in her right foot as well.  Patient thinks she has carpal tunnel in her bilateral hands but is not able to provide significant details. She reports gabapentin and Flexeril did not help her pain much. Patient reports she has used Voltaren gel and this does not help.  Tylenol and NSAIDs did not help.  She takes duloxetine 90 mg, Lyrica 150 mg twice daily, nortriptyline 100 mg daily, tramadol.  She reports these medications are beneficial but she continues to have severe pain.  She reports decreased activity at home.  Patient has been seen by neurology for her peripheral neuropathy and headache.  In 2021 she had a normal EMG study.  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  She reports she was seen by rheumatology for lupus and is on Plaquenil.  Interval history Tracy Banks is here for follow-up of her chronic  pain throughout her body.  She recently had an EMG nerve conduction study completed by Dr. Letta Pate.  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  Patient reports she was seen by her orthopedic surgeon after this and had injection completed to her right wrist because this was the wrist that hurts the most.  She reports her symptoms improved and now are more in intermittent, symptoms were constant previously.  She is considering carpal tunnel surgery because she continues to have pain and difficulty using her hands.  She also recently had arthroscopic surgery to her right knee and is still healing from this procedure.  She has follow-up scheduled with Dr. Theda Sers and appears that per his note he is considering viscosupplementation to the knee and possibly an eventual total knee arthroplasty, however she would be required to lose some weight before this can happen.  She continues to have pain in multiple joints particularly her left shoulder, right knee and right ankle.  Pain Inventory Average Pain 9 Pain Right Now 9 My pain is aching  In the last 24 hours, has pain interfered with the following? General activity 9 Relation with others 9 Enjoyment of life 9 What TIME of day is your pain at its worst? morning , daytime, evening, and night Sleep (in general) Fair  Pain is worse with: bending, sitting, and standing Pain improves with:  nothing right  now Relief from Meds:  no taking any pain med  Family History  Problem Relation Age of Onset   Sarcoidosis Mother    Migraines Mother    Hypertension Mother    Osteoarthritis Mother    Diabetes Mother    Fibromyalgia Mother    Healthy Father    Social History   Socioeconomic History   Marital status: Single    Spouse name: Not on file   Number of children: 3   Years of education: college   Highest education level: Not on file  Occupational History   Not on file   Tobacco Use   Smoking status: Every Day    Packs/day: 0.25    Years: 2.00    Total pack years: 0.50    Types: Cigarettes   Smokeless tobacco: Never   Tobacco comments:    1 pack per week  Vaping Use   Vaping Use: Never used  Substance and Sexual Activity   Alcohol use: Yes    Comment: occ   Drug use: No   Sexual activity: Not on file  Other Topics Concern   Not on file  Social History Narrative   Lives at home with children.   Right-handed.   Caffeine use:  One can soda per day.   Social Determinants of Health   Financial Resource Strain: Not on file  Food Insecurity: Not on file  Transportation Needs: Not on file  Physical Activity: Not on file  Stress: Not on file  Social Connections: Not on file   Past Surgical History:  Procedure Laterality Date   ANKLE SURGERY Bilateral    CHONDROPLASTY Right 01/04/2022   Procedure: KNEE ARTHROSCOPY CHONDROPLASTY;  Surgeon: Sydnee Cabal, MD;  Location: [facility];  Service: Orthopedics;  Laterality: Right;  knee block  60   KNEE ARTHROSCOPY WITH LATERAL MENISECTOMY Right 01/04/2022   Procedure: KNEE ARTHROSCOPY WITH PARTIAL LATERAL MENISECTOMY;  Surgeon: Sydnee Cabal, MD;  Location: [facility];  Service: Orthopedics;  Laterality: Right;  knee block 60   LEG SURGERY Right    Muscle stretched   NEUROMA SURGERY     Past Surgical History:  Procedure Laterality Date   ANKLE SURGERY Bilateral    CHONDROPLASTY Right 01/04/2022   Procedure: KNEE ARTHROSCOPY CHONDROPLASTY;  Surgeon: Sydnee Cabal, MD;  Location: [facility];  Service: Orthopedics;  Laterality: Right;  knee block  60   KNEE ARTHROSCOPY WITH LATERAL MENISECTOMY Right 01/04/2022   Procedure: KNEE ARTHROSCOPY WITH PARTIAL LATERAL MENISECTOMY;  Surgeon: Sydnee Cabal, MD;  Location: [facility];  Service: Orthopedics;  Laterality: Right;  knee block 60   LEG SURGERY Right    Muscle stretched   NEUROMA SURGERY     Past Medical History:  Diagnosis Date   Acid reflux     Anemia    Anxiety    Arthritis    Asthma    Carpal tunnel syndrome    Depression    Eczema    Fibromyalgia    Gout    Hypertension    Migraine    Overactive bladder    Pneumonia    Polyarthralgia    BP 129/85   Pulse (!) 105   Ht 5\' 9"  (1.753 m)   Wt (!) 389 lb 3.2 oz (176.5 kg)   LMP 12/05/2021 (Exact Date)   SpO2 100%   BMI 57.47 kg/m   Opioid Risk Score:   Fall Risk Score:  `1  Depression screen [facility] 2/9     01/18/2022  1:58 PM 09/01/2021    2:07 PM  Depression screen PHQ 2/9  Decreased Interest 3 3  Down, Depressed, Hopeless 3 3  PHQ - 2 Score 6 6  Altered sleeping  2  Tired, decreased energy  2  Change in appetite  1  Feeling bad or failure about yourself   2  Trouble concentrating  2  Moving slowly or fidgety/restless  1  Suicidal thoughts  1  PHQ-9 Score  17     Review of Systems  Constitutional: Negative.   HENT: Negative.    Eyes: Negative.   Respiratory: Negative.    Cardiovascular: Negative.   Gastrointestinal: Negative.   Endocrine: Negative.   Genitourinary: Negative.   Musculoskeletal:  Positive for gait problem.       Knee pain  Skin: Negative.   Allergic/Immunologic: Negative.   Hematological: Negative.   Psychiatric/Behavioral:  Positive for dysphoric mood.   All other systems reviewed and are negative.      Objective:   Physical Exam   Gen: no distress, normal appearing HEENT: oral mucosa pink and moist, NCAT Cardio: Reg rate Chest: normal effort, normal rate of breathing Abd: soft, non-distended, obese Psych: pleasant, normal affect Skin: intact Neuro: Alert and oriented follows commands answers questions appropriately, cranial nerves II through XII intact.   intact to light touch in all 4 extremities however patient reports it is Sensation altered in digits 1 through 3 of her bilateral hands Tinel's positive at both wrists Strength 5/5 throughout Musculoskeletal:  Bilateral pain with palpation of both knees  R>L Decreased lumbar and cervical motion in all directions Pain with palpation of both shoulders but worse on the left Tenderness with palpation of bilateral ankles R>L Tenderness bilateral hands Tenderness throughout paraspinals of the cervical thoracic and lumbar spine Slump test and SLR negative however caused her knee and hip pain Bilateral pes planus, surgical scars noted on both feet     Knee Right 05/24/21 IMPRESSION: 1. No acute or evidence of prior fracture. 2. Minimal early degenerative change.   L spine xray 03/31/21 No recent fracture is seen. Alignment of posterior margins of vertebral bodies is unremarkable. There is disc space narrowing at L5-S1 level. Degenerative changes are noted in facet joints in the lower lumbar spine. There is mild levoscoliosis. There are multiple calcific densities in the right upper quadrant suggesting gallbladder stones.   IMPRESSION: No recent fracture is seen. Alignment of posterior margins of vertebral bodies is unremarkable. There is disc space narrowing at L5-S1 level.      Assessment & Plan:   Polyarthralgia with history of Fibromyalgia, She also has evidence of OA in her knee on the right, history of bilateral foot surgeries, lupus, degenerative disc disease and facet joint changes in her lumbar spine.     -S/P R knee arthroscopy 01/04/21 for meniscus tear -She is currently on multiple medications such as Lyrica, duloxetine and nortriptyline that can be beneficial to fibromyalgia. -Will trial Sevella to see if this will work better than duloxetine, discontinue duloxetine  -Continue PT   Peripheral neuropathy -History of small fiber neuropathy this appears to be well controlled on current medications, she has been followed by neurology --Discussed Qutenza as an option for neuropathic pain control. Discussed that this is a capsaicin patch, stronger than capsaicin cream. Discussed that it is currently approved for diabetic  peripheral neuropathy and post-herpetic neuralgia, but that it has also shown benefit in treating other forms of neuropathy. Provided patient with link to site to  learn more about the patch: [employer]. Discussed that the patch would be placed in office and benefits usually last 3 months. Discussed that unintended exposure to capsaicin can cause severe irritation of eyes, mucous membranes, respiratory tract, and skin, but that Qutenza is a local treatment and does not have the systemic side effects of other nerve medications. Discussed that there may be pain, itching, erythema, and decreased sensory function associated with the application of Qutenza. Side effects usually subside within 1 week. A cold pack of analgesic medications can help with these side effects. Blood pressure can also be increased due to pain associated with administration of the patch.    Carpal tunnel syndrome symptoms greater on the right -EMG completed last month showing moderate median neuropathy of the wrist -Follow with orthopedics, pain improved after recent R CTS injection, however she continues to have symptoms.  She is planning to follow-up with her surgeon for consideration of carpal tunnel surgery.

## 2022-01-19 ENCOUNTER — Other Ambulatory Visit (HOSPITAL_COMMUNITY): Payer: Self-pay | Admitting: Physician Assistant

## 2022-01-19 ENCOUNTER — Ambulatory Visit (HOSPITAL_COMMUNITY)
Admission: RE | Admit: 2022-01-19 | Discharge: 2022-01-19 | Disposition: A | Discharge status: Home / Self Care | Payer: Medicare HMO | Source: Ambulatory Visit | Attending: Physician Assistant | Admitting: Physician Assistant

## 2022-01-19 ENCOUNTER — Inpatient Hospital Stay (HOSPITAL_COMMUNITY): Admission: RE | Admit: 2022-01-19 | Payer: Medicaid Other | Source: Ambulatory Visit

## 2022-01-19 DIAGNOSIS — M79604 Pain in right leg: Secondary | ICD-10-CM | POA: Diagnosis present

## 2022-01-29 ENCOUNTER — Other Ambulatory Visit: Payer: Self-pay | Admitting: Neurology

## 2022-02-22 ENCOUNTER — Ambulatory Visit: Payer: Medicaid Other | Admitting: Physical Medicine & Rehabilitation

## 2022-03-26 ENCOUNTER — Encounter: Payer: Medicare Other | Attending: Physical Medicine & Rehabilitation | Admitting: Physical Medicine & Rehabilitation

## 2022-03-26 ENCOUNTER — Encounter: Payer: Self-pay | Admitting: Physical Medicine & Rehabilitation

## 2022-03-26 VITALS — BP 148/7 | HR 103 | Ht 69.0 in | Wt 385.0 lb

## 2022-03-26 DIAGNOSIS — G5603 Carpal tunnel syndrome, bilateral upper limbs: Secondary | ICD-10-CM | POA: Insufficient documentation

## 2022-03-26 DIAGNOSIS — G894 Chronic pain syndrome: Secondary | ICD-10-CM | POA: Diagnosis present

## 2022-03-26 DIAGNOSIS — M25561 Pain in right knee: Secondary | ICD-10-CM | POA: Diagnosis present

## 2022-03-26 DIAGNOSIS — Z79891 Long term (current) use of opiate analgesic: Secondary | ICD-10-CM | POA: Insufficient documentation

## 2022-03-26 DIAGNOSIS — Z5181 Encounter for therapeutic drug level monitoring: Secondary | ICD-10-CM | POA: Diagnosis present

## 2022-03-26 DIAGNOSIS — G8929 Other chronic pain: Secondary | ICD-10-CM | POA: Insufficient documentation

## 2022-03-26 DIAGNOSIS — G6289 Other specified polyneuropathies: Secondary | ICD-10-CM | POA: Insufficient documentation

## 2022-03-26 DIAGNOSIS — M797 Fibromyalgia: Secondary | ICD-10-CM | POA: Insufficient documentation

## 2022-03-26 MED ORDER — DULOXETINE HCL 60 MG PO CPEP: 60.0000 mg | ORAL_CAPSULE | Freq: Every day | ORAL | 3 refills | Status: DC

## 2022-03-26 MED ORDER — DULOXETINE HCL 30 MG PO CPEP: 30.0000 mg | ORAL_CAPSULE | Freq: Every day | ORAL | 3 refills | Status: AC

## 2022-03-26 NOTE — Progress Notes (Addendum)
Subjective:    Patient ID: Tracy Banks, female    DOB: 1980-07-05, 42 y.o.   MRN: [id number]  HPI HPI 09/01/21 41 year old female with past medical history of migraine headaches, acid reflux, fibromyalgia, obesity , lupus, overactive bladder, peripheral neuropathy here for evaluation of her chronic pain.  Patient reports she has pain throughout her body and that everything hurts.  She reports currently one of her worst pains is in her hands.  She has pain in the first 3 digits of her bilateral hands in addition to numbness in these fingers.  Additionally she reports her ankles hurt and she had a small surgery on a neuroma on her right foot in the past and also a prior significant surgery on her left foot with multiple plates placed.  Patient says she had the surgeries for very flat feet.  Patient reports she has had multiple cortisone injections in her feet.  She reports chronic neuropathy in her feet but reports the pain from this is well controlled.  She has pain in her left foot around the ankle and pain in her right foot as well.  Patient thinks she has carpal tunnel in her bilateral hands but is not able to provide significant details. She reports gabapentin and Flexeril did not help her pain much. Patient reports she has used Voltaren gel and this does not help.  Tylenol and NSAIDs did not help.  She takes duloxetine 90 mg, Lyrica 150 mg twice daily, nortriptyline 100 mg daily, tramadol.  She reports these medications are beneficial but she continues to have severe pain.  She reports decreased activity at home.  Patient has been seen by neurology for her peripheral neuropathy and headache.  In 2021 she had a normal EMG study.  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  She reports she was seen by rheumatology for lupus and is on Plaquenil.   Visit 01/18/22 Tracy Banks is here for follow-up of her chronic pain  throughout her body.  She recently had an EMG nerve conduction study completed by Dr. Wynn Banker.  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  Patient reports she was seen by her orthopedic surgeon after this and had injection completed to her right wrist because this was the wrist that hurts the most.  She reports her symptoms improved and now are more in intermittent, symptoms were constant previously.  She is considering carpal tunnel surgery because she continues to have pain and difficulty using her hands.  She also recently had arthroscopic surgery to her right knee and is still healing from this procedure.  She has follow-up scheduled with Dr. Thomasena Edis and appears that per his note he is considering viscosupplementation to the knee and possibly an eventual total knee arthroplasty, however she would be required to lose some weight before this can happen.  She continues to have pain in multiple joints particularly her left shoulder, right knee and right ankle.  Interval History Tracy Banks is here for follow-up of her pain.  Her pain has been very severe in her right knee.  She feels like her overall fibromyalgia pain throughout her body is not causing her significant discomfort because her knee pain is so severe.  Knee pain is making it difficult for her to ambulate.  She recently had arthroscopic knee surgery and plans to make a follow-up  visit with her surgeon.  She says she never tried the [place] because it was not available at the pharmacy.  She continues taking duloxetine 90 mg, Lyrica 150 mg 3 times a day and nortriptyline for her fibromyalgia.  She has also been taking hydrocodone recently for her severe knee pain.  Hydrocodone has been helping to control her knee pain and she denies any significant side effects with the medication.  Pain Inventory Average Pain 9 Pain Right Now 9 My pain is sharp, stabbing, and  aching  In the last 24 hours, has pain interfered with the following? General activity 10 Relation with others 10 Enjoyment of life 10 What TIME of day is your pain at its worst? morning , daytime, evening, and night Sleep (in general) Fair  Pain is worse with: walking, bending, sitting, inactivity, and standing Pain improves with:  . Relief from Meds: 0  Family History  Problem Relation Age of Onset   Sarcoidosis Mother    Migraines Mother    Hypertension Mother    Osteoarthritis Mother    Diabetes Mother    Fibromyalgia Mother    Healthy Father    Social History   Socioeconomic History   Marital status: Single    Spouse name: Not on file   Number of children: 3   Years of education: college   Highest education level: Not on file  Occupational History   Not on file  Tobacco Use   Smoking status: Every Day    Packs/day: 0.25    Years: 2.00    Total pack years: 0.50    Types: Cigarettes   Smokeless tobacco: Never   Tobacco comments:    1 pack per week  Vaping Use   Vaping Use: Never used  Substance and Sexual Activity   Alcohol use: Yes    Comment: occ   Drug use: No   Sexual activity: Not on file  Other Topics Concern   Not on file  Social History Narrative   Lives at home with children.   Right-handed.   Caffeine use:  One can soda per day.   Social Determinants of Health   Financial Resource Strain: Not on file  Food Insecurity: Not on file  Transportation Needs: Not on file  Physical Activity: Not on file  Stress: Not on file  Social Connections: Not on file   Past Surgical History:  Procedure Laterality Date   ANKLE SURGERY Bilateral    CHONDROPLASTY Right 01/04/2022   Procedure: KNEE ARTHROSCOPY CHONDROPLASTY;  Surgeon: Sydnee Cabal, MD;  Location: [facility];  Service: Orthopedics;  Laterality: Right;  knee block  60   KNEE ARTHROSCOPY WITH LATERAL MENISECTOMY Right 01/04/2022   Procedure: KNEE ARTHROSCOPY WITH PARTIAL LATERAL MENISECTOMY;   Surgeon: Sydnee Cabal, MD;  Location: [facility];  Service: Orthopedics;  Laterality: Right;  knee block 60   LEG SURGERY Right    Muscle stretched   NEUROMA SURGERY     Past Surgical History:  Procedure Laterality Date   ANKLE SURGERY Bilateral    CHONDROPLASTY Right 01/04/2022   Procedure: KNEE ARTHROSCOPY CHONDROPLASTY;  Surgeon: Sydnee Cabal, MD;  Location: [facility];  Service: Orthopedics;  Laterality: Right;  knee block  60   KNEE ARTHROSCOPY WITH LATERAL MENISECTOMY Right 01/04/2022   Procedure: KNEE ARTHROSCOPY WITH PARTIAL LATERAL MENISECTOMY;  Surgeon: Sydnee Cabal, MD;  Location: [facility];  Service: Orthopedics;  Laterality: Right;  knee block 60   LEG SURGERY Right    Muscle stretched  NEUROMA SURGERY     Past Medical History:  Diagnosis Date   Acid reflux    Anemia    Anxiety    Arthritis    Asthma    Carpal tunnel syndrome    Depression    Eczema    Fibromyalgia    Gout    Hypertension    Migraine    Overactive bladder    Pneumonia    Polyarthralgia    BP (!) 148/7   Pulse (!) 103   Ht 5\' 9"  (1.753 m)   Wt (!) 385 lb (174.6 kg)   SpO2 100%   BMI 56.85 kg/m   Opioid Risk Score:   Fall Risk Score:  `1  Depression screen [facility] 2/9     01/18/2022    1:58 PM 09/01/2021    2:07 PM  Depression screen PHQ 2/9  Decreased Interest 3 3  Down, Depressed, Hopeless 3 3  PHQ - 2 Score 6 6  Altered sleeping  2  Tired, decreased energy  2  Change in appetite  1  Feeling bad or failure about yourself   2  Trouble concentrating  2  Moving slowly or fidgety/restless  1  Suicidal thoughts  1  PHQ-9 Score  17      Review of Systems  Musculoskeletal:  Positive for back pain.       Right knee pain  All other systems reviewed and are negative.     Objective:   Physical Exam   Gen: no distress, normal appearing HEENT: oral mucosa pink and moist, NCAT Cardio: Reg rate Chest: normal effort, normal rate of breathing Abd: soft, non-distended,  obese Psych: pleasant, normal affect Skin: intact Neuro: Alert and oriented follows commands answers questions appropriately, cranial nerves II through XII intact.   Strength 5/5 throughout Musculoskeletal:  Bilateral pain with palpation of R knee, pain with R knee ROM, valgus and varus stress  Decreased lumbar and cervical motion in all directions Mild Tenderness throughout paraspinals of the cervical thoracic and lumbar spine Slump test and SLR negative         Knee Right 05/24/21 IMPRESSION: 1. No acute or evidence of prior fracture. 2. Minimal early degenerative change.   L spine xray 03/31/21 No recent fracture is seen. Alignment of posterior margins of vertebral bodies is unremarkable. There is disc space narrowing at L5-S1 level. Degenerative changes are noted in facet joints in the lower lumbar spine. There is mild levoscoliosis. There are multiple calcific densities in the right upper quadrant suggesting gallbladder stones.   IMPRESSION: No recent fracture is seen. Alignment of posterior margins of vertebral bodies is unremarkable. There is disc space narrowing at L5-S1 level.     Assessment & Plan:  R knee pain S/P R knee arthroscopy 01/04/21 for meniscus tear -Not improved with tylenol/NSAIDs -Severe limiting R knee pain, improved with recent order of Hydrocodone 5mg  -UDS and Pain agreement today -Hydrocodone/Acetaminophen 5/325- consider after UDS -Will hold of on use of tramadol-increased risk of serotonin syndrome and she is already on SNRI and TCA  Polyarthralgia with history of Fibromyalgia, She also has evidence of OA in her knee on the right, history of bilateral foot surgeries, lupus, degenerative disc disease and facet joint changes in her lumbar spine.     -She is currently on multiple medications such as Lyrica, duloxetine and nortriptyline that can be beneficial to fibromyalgia.- continue current medications -She reports she didn't get Sevella, will hold  off on starting this today, continue  above medications  -Order Zynex device   Peripheral neuropathy -History of small fiber neuropathy this appears to be well controlled on current medications, she has been followed by neurology --Discussed Qutenza as an option for neuropathic pain control. Discussed that this is a capsaicin patch, stronger than capsaicin cream. Discussed that it is currently approved for diabetic peripheral neuropathy and post-herpetic neuralgia, but that it has also shown benefit in treating other forms of neuropathy. Provided patient with link to site to learn more about the patch: https://www.clark.biz/. Discussed that the patch would be placed in office and benefits usually last 3 months. Discussed that unintended exposure to capsaicin can cause severe irritation of eyes, mucous membranes, respiratory tract, and skin, but that Qutenza is a local treatment and does not have the systemic side effects of other nerve medications. Discussed that there may be pain, itching, erythema, and decreased sensory function associated with the application of Qutenza. Side effects usually subside within 1 week. A cold pack of analgesic medications can help with these side effects. Blood pressure can also be increased due to pain associated with administration of the patch.  -Will consider at later time   Carpal tunnel syndrome symptoms greater on the right -EMG completed previously moderate median neuropathy of the wrist -Follow with orthopedics, pain improved after prior R CTS injection.  04/25/22 Called to f/u UDS, no answer, left discrete VM. Low level THC was noted, tramadol also noted, hydrocodone not noted

## 2022-03-27 ENCOUNTER — Encounter: Payer: Self-pay | Admitting: Physical Medicine & Rehabilitation

## 2022-03-29 LAB — TOXASSURE SELECT,+ANTIDEPR,UR

## 2022-04-03 ENCOUNTER — Other Ambulatory Visit: Payer: Self-pay | Admitting: Neurology

## 2022-04-11 ENCOUNTER — Ambulatory Visit: Payer: Medicare HMO | Admitting: Neurology

## 2022-04-11 NOTE — Progress Notes (Deleted)
Patient: Tracy Banks Date of Birth: 25-Mar-1980  Reason for Visit: Follow up History from: Patient Primary Neurologist: Dr.Yan   ASSESSMENT AND PLAN 42 y.o. year old female   70.  Bilateral hand and feet burning pain, consistent with neuropathic pain 2.  Worsening hand tingling, weakness -Order bilateral upper NCV/EMG for worsening symptoms, r/o CTS -April 2021 EMG/NCV showed no large fiber peripheral neuropathy -Skin biopsy confirmed small fiber neuropathy -Rheumatology diagnosis connective tissue disease, started on Plaquenil -Continue Lyrica 150 mg 3 times daily -Continue nortriptyline 50 mg, 2 tablets at bedtime -Also on Cymbalta -Seeing pain management now, referred for physical therapy  3.  Worsening headache, risk for pseudotumor cerebri -Seeing ophthalmology next week -Will continue current medications including Topamax, propanolol, Emgality -MRI of the brain was normal  4.  Obstructive sleep apnea -Will schedule sleep consult  Addendum 10/17/21 SS: Had ophthalmology visit with Dr. Katy Fitch 10/12/2021, no evidence of papilledema or increased ICP on exam, recommended weight loss and follow-up with neurology.  On hydroxychloroquine no toxicity or maculopathy noted.  HISTORY  Tracy Banks Is a 42 years old female, seen in request by her primary care nurse practitioner Suella Broad, for evaluation of migraine headache,   I have reviewed and summarized the referring note from the referring physician.  She reported a history of migraine headaches since high school, but her migraine are lateralized severe pounding headache with associated light noise sensitivity, lasting for hours, going to sleep usually helps, she used to have migraine 2-3 times each month, trigger for her migraines are light, noise, smell from freshly cut grass, stress, sleep deprivation, weather changes, her son, mother also suffered migraines,   She also has history of obesity, has been on blood  pressures for over past 10 years, her weight is overall stable, there was no significant visual changes.   She complains of slow worsening migraine headaches over the past couple years, has been getting injection as a urgent abortive treatment at her primary care's office every 2 to 4 weeks, Imitrex provide limited help, Phenergan, sleep is usually helpful, she has been taking over-the-counter medication, in combination with Imitrex almost every other day to control her headache over the past few months.  She also had visual auras preceding her migraine, blurry vision, distortion of the colors,   She was started on Inderal 40 mg twice a day, Emagality since February 2020, no significant benefit noticed yet.  She is also on polypharmacy for fibromyalgia, Elavil 50 mg every night Cymbalta 60 mg daily, gabapentin 900 mg 3 times a day.   UPDATE June 11 2019: She was on Emgality for about a year, during that period of time her headache was under reasonable control, it was stopped since February 2021, since then, she noticed increased to headache, 2-3 times headache daily, bilateral temporal parietal region pounding headache, with light, noise sensitivity, she has tried Maxalt, without helping her headaches   She is also on preventive medication Topamax, Inderal, she complains of difficulty sleeping, had frequent headaches when trying to go to sleep   In addition, she complains of bilateral hands and toes numbness tingling since 2020, intermittent involving all toes, and all 5 fingers, it hurt when she bear weight, she also complains of right-sided neck pain, radiating pain to right shoulder   She denies persistent upper or lower extremity sensory loss, weakness, incontinence   UPDATE July 08 2019: Patient return for electrodiagnostic study today, which was essentially normal, there was no evidence  of large fiber peripheral neuropathy or bilateral lumbosacral radiculopathy   She complains 2 years  history of intermittent patchy skin lesions, a year history of bilateral fingertips and the feet burning pain,   Laboratory evaluations on June 11, 2019 showed positive ANA, double-stranded DNA 39, elevated ESR of 54, CMP showed mild elevated creatinine 1.05, otherwise normal CBC, protein electrophoresis, vitamin B12, HIV, RPR, TSH,   She has been taking gabapentin up to 900 mg 3 times daily without helping her neuropathic pain significantly, also on Cymbalta 60 mg twice daily, has never tried Lyrica   UPDATE May 23 2020: She was seen by rheumatologist Dr. Lenn Sink on October 22, 2019, did have some sun sensitive skin rash, antiphospholipid antibody, elevated ESR, low positive double-stranded DNA with all the other labs for lupus being negative Positive p-ANCA but all other labs for vasculitis was negative Slight elevated CK of unknown clinical significance   She was diagnosed with connective tissue disorder, was started on Plaquenil, which did help her symptoms some, Also had a skin biopsy in May 2021, informed with diagnosis of small fiber neuropathy,   She continue complains of headache up to 5 times each week, frequent stabbing pain, sometimes longer moderate steady pain couple times a week with light noise sensitivity, nauseous, Zomig did not help her symptoms   She did complains of slow worsening blurry vision, noted over 40 pounds weight gain over past 10 months, she has not seen by ophthalmologist for many months   UPDATE Feb 9th 2023: Her insurance changed at the beginning of 2023, no longer cover her aimovig, even while she was taking aimovig, she continues to have almost daily moderate headache, bilateral frontal region, as if somebody has punched her forehead, severe pain last about 10 minutes, followed by lower degree of pressure pain lasting for few hours, waxing and wanes throughout the day  She is no longer taking abortive treatment, tried Relpax, does not help, she denies  frequent over-the-counter medication use  For preventive medications, she is nowtaking Topamax 100 mg twice a day, nortriptyline 50 mg 2 at bedtime, propanolol 40 mg twice a day, she works as a Scientist, water quality, continue battling weight problem, also began to noticed blurry vision, taking her a while to refocus  Personally reviewed MRI brain in May 2022 that was normal  Laboratory evaluation in 2021 showed positive ANA, and panANCA, taking Plaquenil   She also complains of excessive daytime fatigue, sleepiness, loud snoring  Update October 04, 2021 SS: Feels more weakness to hands, right more than left, harder to open things, zip her pants, tingling in finger tips 1-3 fingers bilaterally. Impaired touch sensation. Was suggested may have carpal tunnel from pain management, just established, was referred for physical therapy.  She works part-time as a Scientist, water quality at the schedule.  Continues with daily headache, is on Emgality, nortriptyline, Lyrica, propanolol, Topamax.  Indicates Maxalt was not helpful for headache.  Also on Cymbalta, Plaquenil, tramadol.  Seeing ophthalmology next week.  Needs to schedule sleep study  Update April 11, 2022 SS:    REVIEW OF SYSTEMS: Out of a complete 14 system review of symptoms, the patient complains only of the following symptoms, and all other reviewed systems are negative.  See HPI  ALLERGIES: Allergies  Allergen Reactions   Penicillins Palpitations    Rapid heart beat Has patient had a PCN reaction causing immediate rash, facial/tongue/throat swelling, SOB or lightheadedness with hypotension: no Has patient had a PCN reaction causing severe rash involving  mucus membranes or skin necrosis: no Has patient had a PCN reaction that required hospitalization unknown Has patient had a PCN reaction occurring within the last 10 years: no If all of the above answers are "NO", then may proceed with Cephalosporin use.  Rapid heart beat Has patient had a PCN reaction causing  immediate rash, facial/tongue/throat swelling, SOB or lightheadedness with hypotension: no Has patient had a PCN reaction causing severe rash involving mucus membranes or skin necrosis: no Has patient had a PCN reaction that required hospitalization unknown Has patient had a PCN reaction occurring within the last 10 years: no If all of the above answers are "NO", then may proceed with Cephalosporin use. Rapid heart beat Has patient had a PCN reaction causing immediate rash, facial/tongue/throat swelling, SOB or lightheadedness with hypotension: no Has patient had a PCN reaction causing severe rash involving mucus membranes or skin necrosis: no Has patient had a PCN reaction that required hospitalization unknown Has patient had a PCN reaction occurring within the last 10 years: no If all of the above answers are "NO", then may proceed with Cephalosporin use. Other reaction(s): Tachycardia    HOME MEDICATIONS: Outpatient Medications Prior to Visit  Medication Sig Dispense Refill   buPROPion (WELLBUTRIN XL) 300 MG 24 hr tablet Take 300 mg by mouth daily.     Cholecalciferol ([employer] VITAMIN D3) 25 MCG (1000 UT) CHEW Chew by mouth.     DULoxetine (CYMBALTA) 30 MG capsule Take 1 capsule (30 mg total) by mouth daily. 30 capsule 3   DULoxetine (CYMBALTA) 60 MG capsule Take 1 capsule (60 mg total) by mouth daily. 30 capsule 3   EMGALITY 120 MG/ML SOAJ Inject 140 mg into the skin every 30 (thirty) days. (Patient taking differently: Inject 120 mg into the skin every 30 (thirty) days.) 1.12 mL 11   meloxicam (MOBIC) 7.5 MG tablet Take 7.5 mg by mouth daily.     methocarbamol (ROBAXIN) 500 MG tablet Take 1 tablet (500 mg total) by mouth 4 (four) times daily. 60 tablet 0   nortriptyline (PAMELOR) 50 MG capsule TAKE 2 CAPSULES(100 MG) BY MOUTH AT BEDTIME 60 capsule 5   ondansetron (ZOFRAN) 4 MG tablet Take 1 tablet (4 mg total) by mouth every 8 (eight) hours as needed for nausea or vomiting. 30 tablet 1    oxyCODONE (ROXICODONE) 5 MG immediate release tablet Take 1 tablet (5 mg total) by mouth every 4 (four) hours as needed for severe pain. 40 tablet 0   pregabalin (LYRICA) 150 MG capsule Take 1 capsule (150 mg total) by mouth in the morning, at noon, and at bedtime. 90 capsule 5   propranolol (INDERAL) 40 MG tablet TAKE 1 TABLET(40 MG) BY MOUTH TWICE DAILY 180 tablet 3   rosuvastatin (CRESTOR) 10 MG tablet Take 10 mg by mouth daily.     tolterodine (DETROL LA) 4 MG 24 hr capsule Take 4 mg by mouth daily.     topiramate (TOPAMAX) 100 MG tablet One in am/ 2 at night (Patient taking differently: Take 100 mg by mouth 2 (two) times daily.) 270 tablet 4   torsemide (DEMADEX) 20 MG tablet Take 20 mg by mouth daily.     traMADol (ULTRAM) 50 MG tablet Take 50 mg by mouth daily.     ziprasidone (GEODON) 20 MG capsule Take 20 mg by mouth See admin instructions. Take with 80 mg for a total of 100 mg daily     ziprasidone (GEODON) 80 MG capsule Take 80 mg by  mouth See admin instructions. Take with 20 mg for a total of 100 mg daily     No facility-administered medications prior to visit.    PAST MEDICAL HISTORY: Past Medical History:  Diagnosis Date   Acid reflux    Anemia    Anxiety    Arthritis    Asthma    Carpal tunnel syndrome    Depression    Eczema    Fibromyalgia    Gout    Hypertension    Migraine    Overactive bladder    Pneumonia    Polyarthralgia     PAST SURGICAL HISTORY: Past Surgical History:  Procedure Laterality Date   ANKLE SURGERY Bilateral    CHONDROPLASTY Right 01/04/2022   Procedure: KNEE ARTHROSCOPY CHONDROPLASTY;  Surgeon: Sydnee Cabal, MD;  Location: [facility];  Service: Orthopedics;  Laterality: Right;  knee block  60   KNEE ARTHROSCOPY WITH LATERAL MENISECTOMY Right 01/04/2022   Procedure: KNEE ARTHROSCOPY WITH PARTIAL LATERAL MENISECTOMY;  Surgeon: Sydnee Cabal, MD;  Location: [facility];  Service: Orthopedics;  Laterality: Right;  knee block 60   LEG SURGERY  Right    Muscle stretched   NEUROMA SURGERY      FAMILY HISTORY: Family History  Problem Relation Age of Onset   Sarcoidosis Mother    Migraines Mother    Hypertension Mother    Osteoarthritis Mother    Diabetes Mother    Fibromyalgia Mother    Healthy Father     SOCIAL HISTORY: Social History   Socioeconomic History   Marital status: Single    Spouse name: Not on file   Number of children: 3   Years of education: college   Highest education level: Not on file  Occupational History   Not on file  Tobacco Use   Smoking status: Every Day    Packs/day: 0.25    Years: 2.00    Total pack years: 0.50    Types: Cigarettes   Smokeless tobacco: Never   Tobacco comments:    1 pack per week  Vaping Use   Vaping Use: Never used  Substance and Sexual Activity   Alcohol use: Yes    Comment: occ   Drug use: No   Sexual activity: Not on file  Other Topics Concern   Not on file  Social History Narrative   Lives at home with children.   Right-handed.   Caffeine use:  One can soda per day.   Social Determinants of Health   Financial Resource Strain: Not on file  Food Insecurity: Not on file  Transportation Needs: Not on file  Physical Activity: Not on file  Stress: Not on file  Social Connections: Not on file  Intimate Partner Violence: Not on file    PHYSICAL EXAM  There were no vitals filed for this visit.  There is no height or weight on file to calculate BMI.  Generalized: Well developed, in no acute distress, obese Neurological examination  Mentation: Alert oriented to time, place, history taking. Follows all commands speech and language fluent Cranial nerve II-XII: Pupils were equal round reactive to light. Extraocular movements were full, visual field were full on confrontational test. Facial sensation and strength were normal. Head turning and shoulder shrug were normal and symmetric. Motor: Good strength all extremities, with grip strength, poor effort,  positive tinel's sign bilaterally  Sensory: Sensory testing is intact to soft touch on all 4 extremities. No evidence of extinction is noted.  Coordination: Cerebellar testing reveals good  finger-nose-finger and heel-to-shin bilaterally.  Gait and station: Gait is steady, limited by large body habitus Reflexes: Deep tendon reflexes are symmetric but decreased  DIAGNOSTIC DATA (LABS, IMAGING, TESTING) - I reviewed patient records, labs, notes, testing and imaging myself where available.  Lab Results  Component Value Date   WBC 10.1 01/02/2022   HGB 11.7 (L) 01/02/2022   HCT 39.1 01/02/2022   MCV 84.1 01/02/2022   PLT 193 01/02/2022      Component Value Date/Time   NA 143 01/02/2022 1027   NA 143 06/11/2019 1024   K 4.3 01/02/2022 1027   CL 112 (H) 01/02/2022 1027   CO2 25 01/02/2022 1027   GLUCOSE 96 01/02/2022 1027   BUN 13 01/02/2022 1027   BUN 11 06/11/2019 1024   CREATININE 1.25 (H) 01/02/2022 1027   CALCIUM 8.4 (L) 01/02/2022 1027   PROT 7.2 06/11/2019 1024   ALBUMIN 4.1 06/11/2019 1024   AST 29 06/11/2019 1024   ALT 28 06/11/2019 1024   ALKPHOS 95 06/11/2019 1024   BILITOT 0.2 06/11/2019 1024   GFRNONAA 55 (L) 01/02/2022 1027   GFRAA 76 06/11/2019 1024   No results found for: "CHOL", "HDL", "LDLCALC", "LDLDIRECT", "TRIG", "CHOLHDL" Lab Results  Component Value Date   HGBA1C 5.3 06/11/2019   Lab Results  Component Value Date   [id number] 06/11/2019   Lab Results  Component Value Date   TSH 1.400 06/11/2019    Butler Denmark, AGNP-C, DNP 04/11/2022, 5:24 AM [facility] [address], [place], [place] [address] [phone]

## 2022-04-16 ENCOUNTER — Other Ambulatory Visit: Payer: Self-pay | Admitting: Family Medicine

## 2022-04-16 DIAGNOSIS — Z1231 Encounter for screening mammogram for malignant neoplasm of breast: Secondary | ICD-10-CM

## 2022-04-19 ENCOUNTER — Ambulatory Visit: Payer: Medicare Other

## 2022-04-21 IMAGING — DX DG THORACIC SPINE 3V
3 series · 3 of 3 positions shown · non-contrast
Comparison: None.

CLINICAL DATA: Back pain

EXAM:
THORACIC SPINE - 3 VIEWS

[dg thoracic spine w/swimmers (1 of 3)]
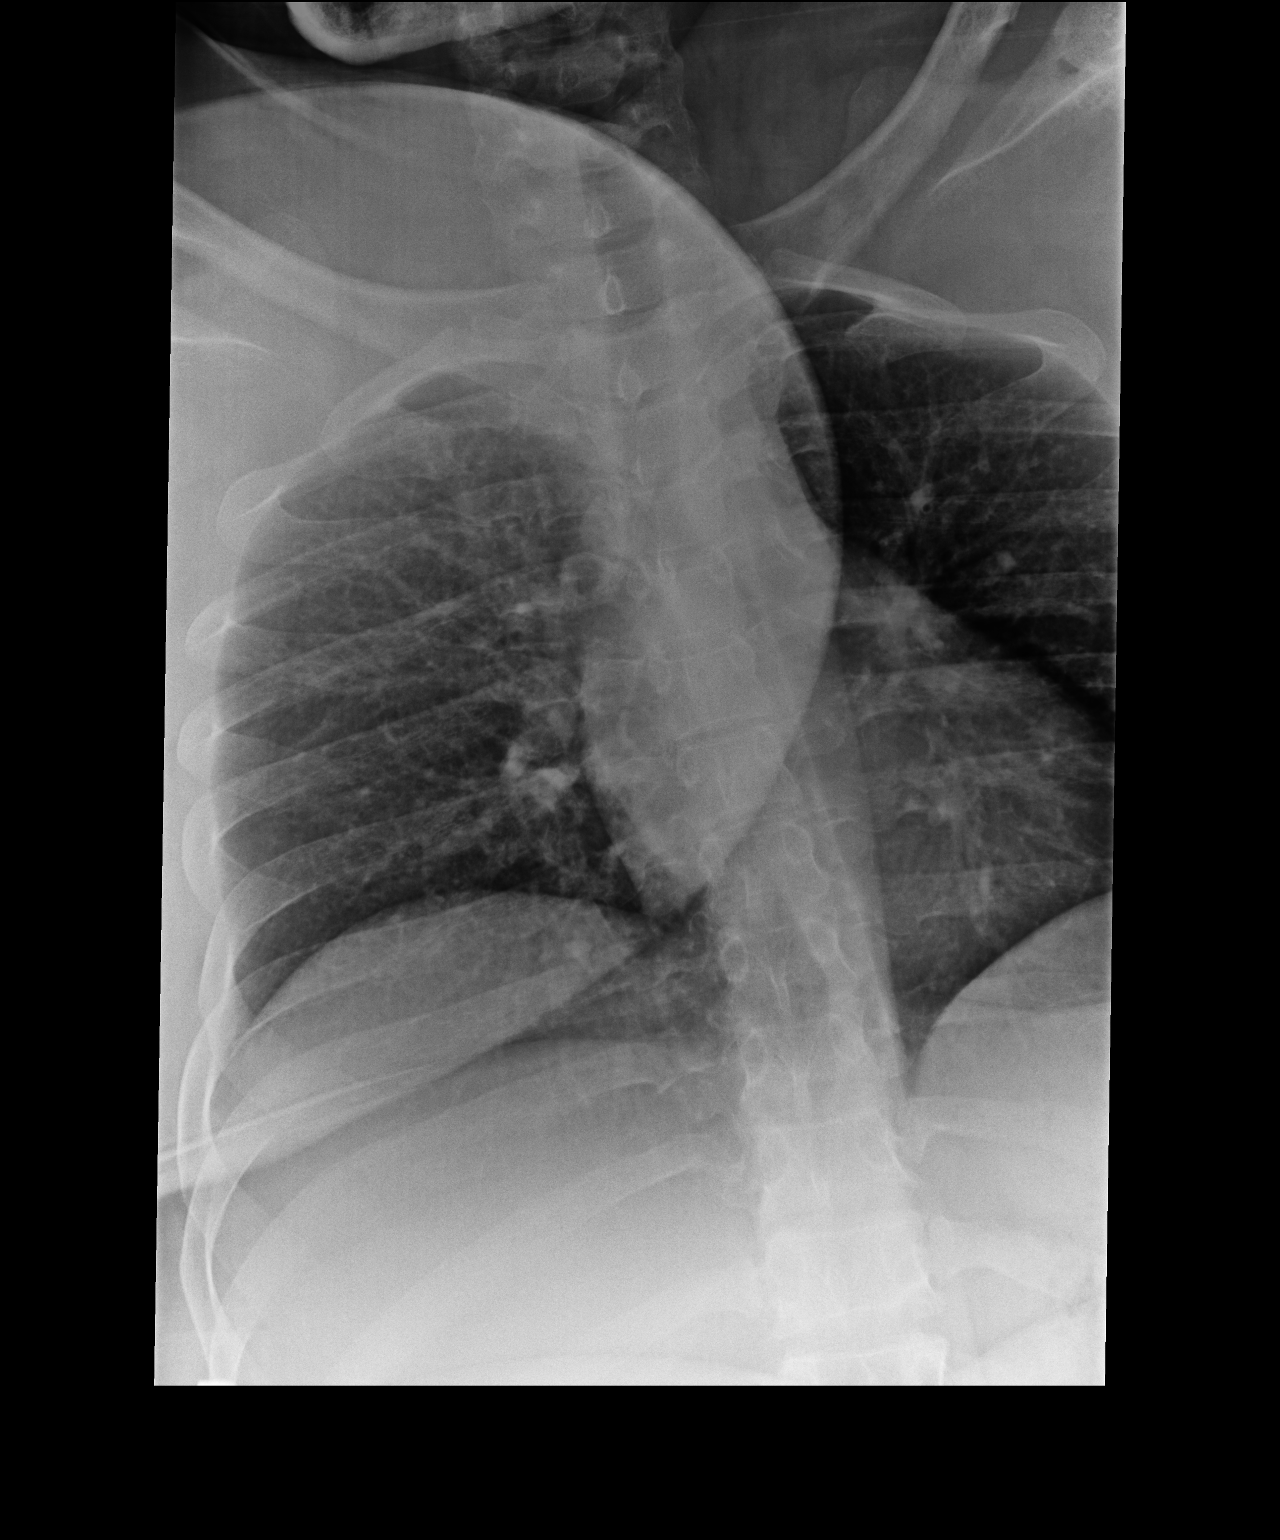

[dg thoracic spine w/swimmers (2 of 3)]
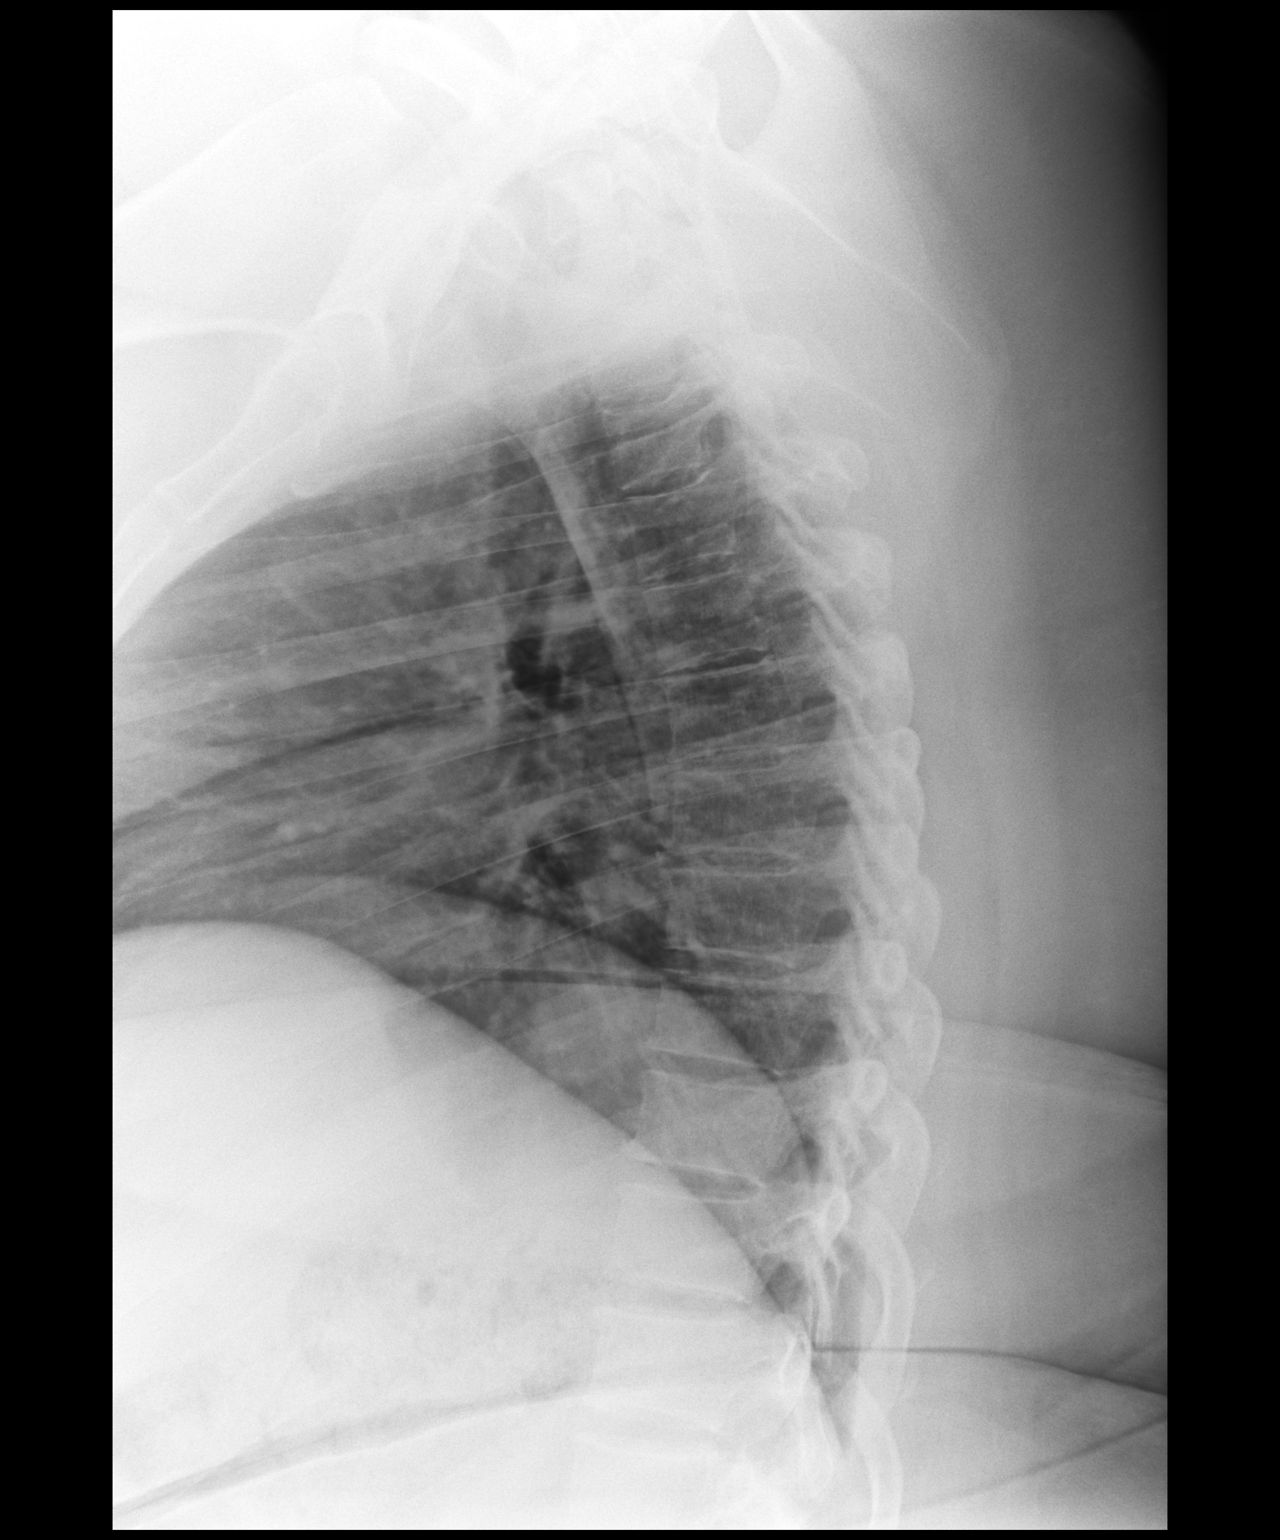

[dg thoracic spine w/swimmers (3 of 3)]
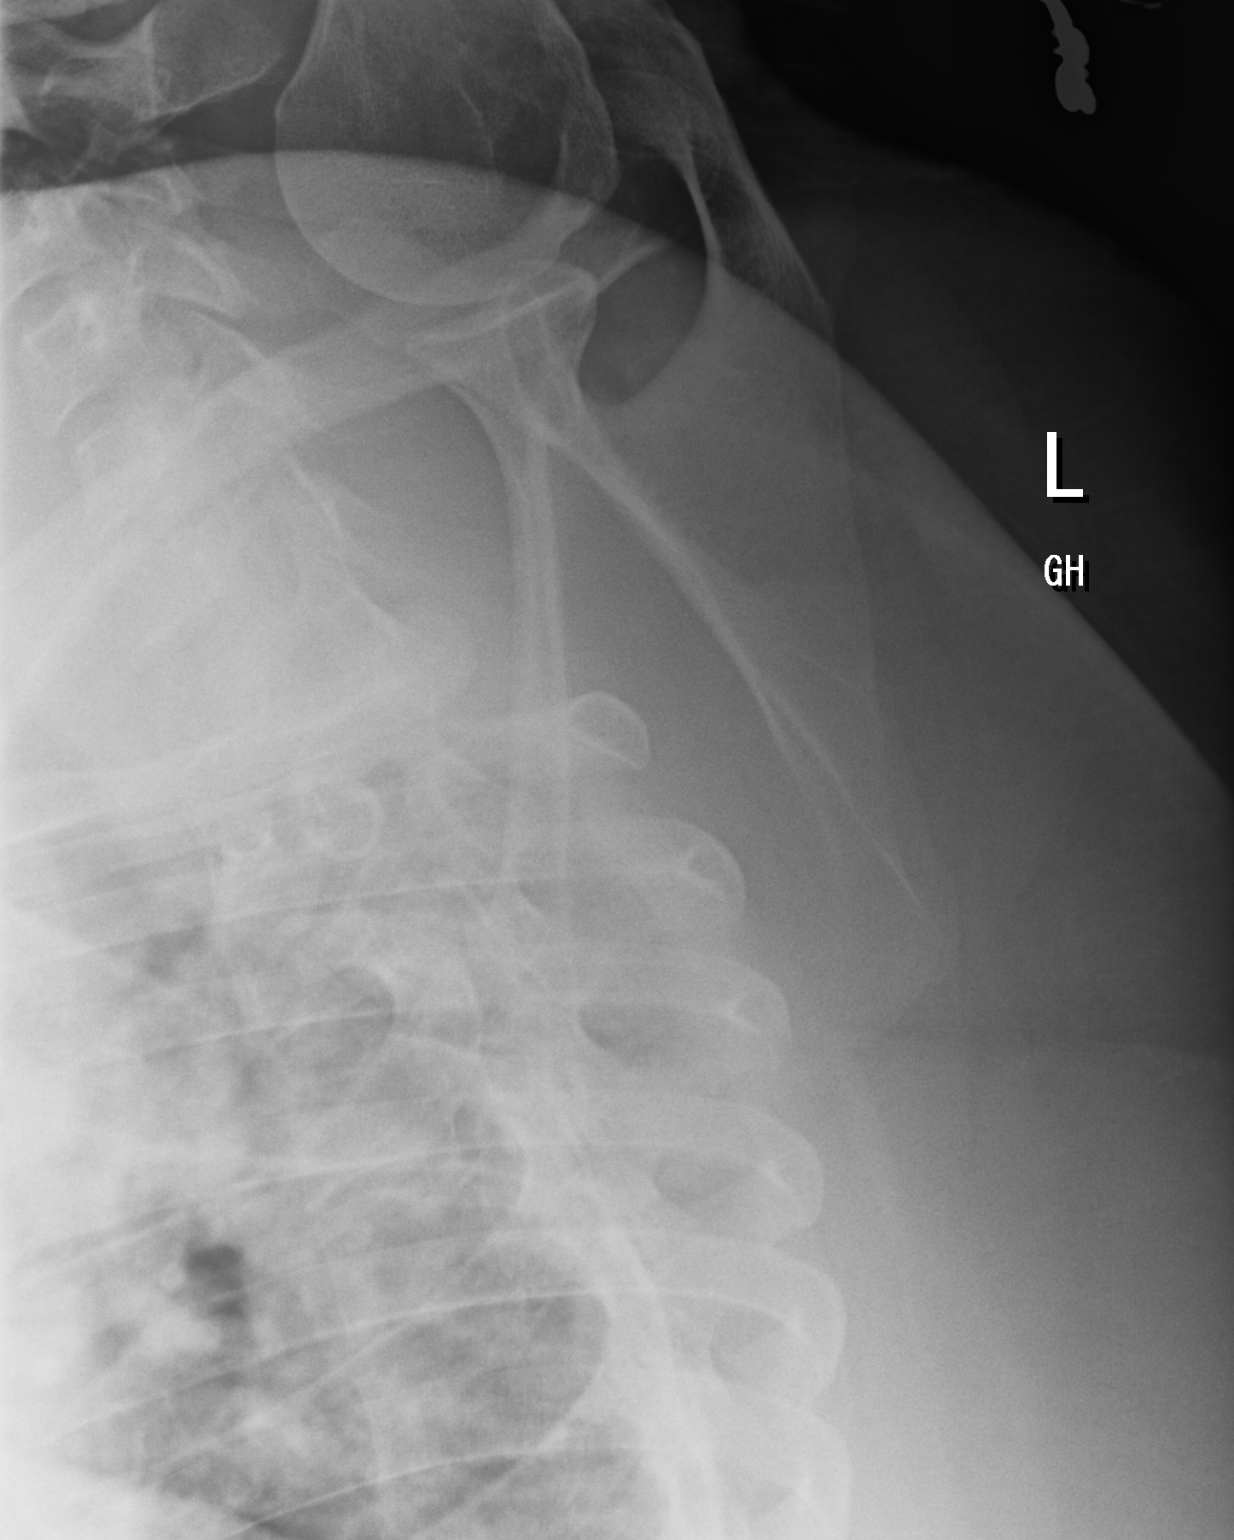

[3 of 3 positions shown; findings below may reference images not displayed]

FINDINGS: No recent fracture is seen. Alignment of posterior margins of
vertebral bodies is unremarkable. Paraspinal soft tissues are
unremarkable.
IMPRESSION: No radiographic abnormality is seen in the thoracic spine.

## 2022-04-21 IMAGING — DX DG LUMBAR SPINE COMPLETE 4+V
6 series · 6 of 6 positions shown · non-contrast
Comparison: None.

CLINICAL DATA: Back pain

EXAM:
LUMBAR SPINE - COMPLETE 4+ VIEW

[dg lumbar spine complete 4 +v (1 of 6)]
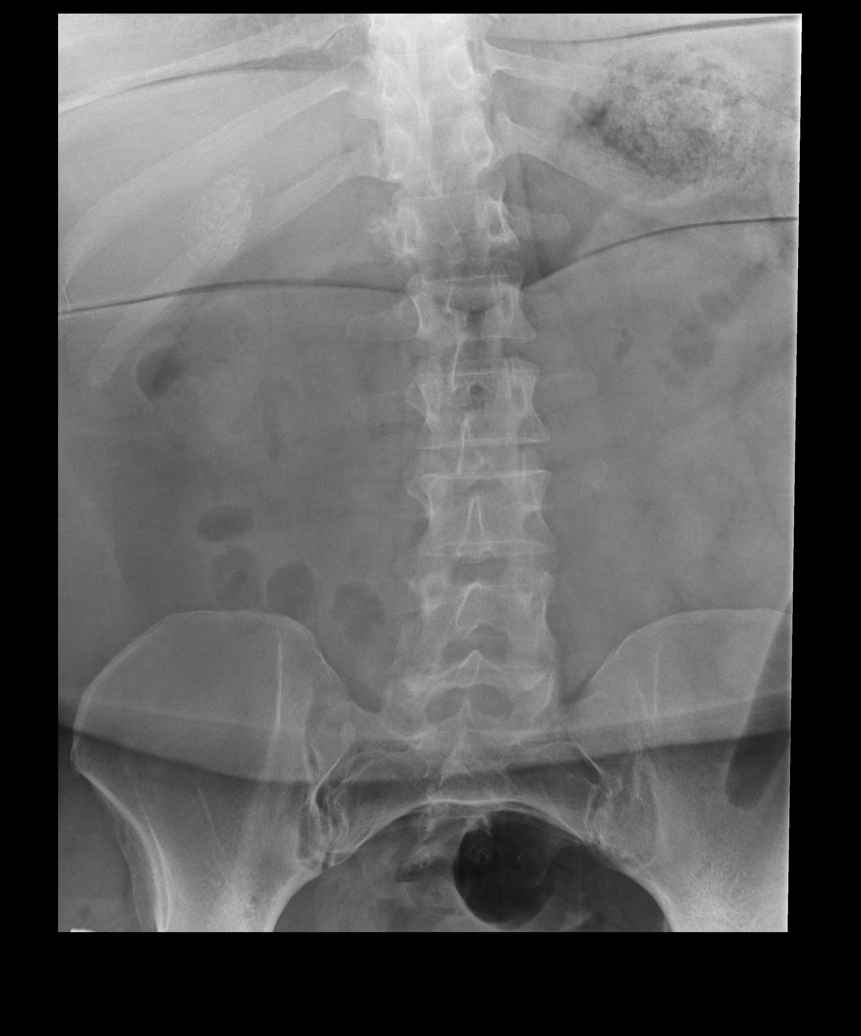

[dg lumbar spine complete 4 +v (2 of 6)]
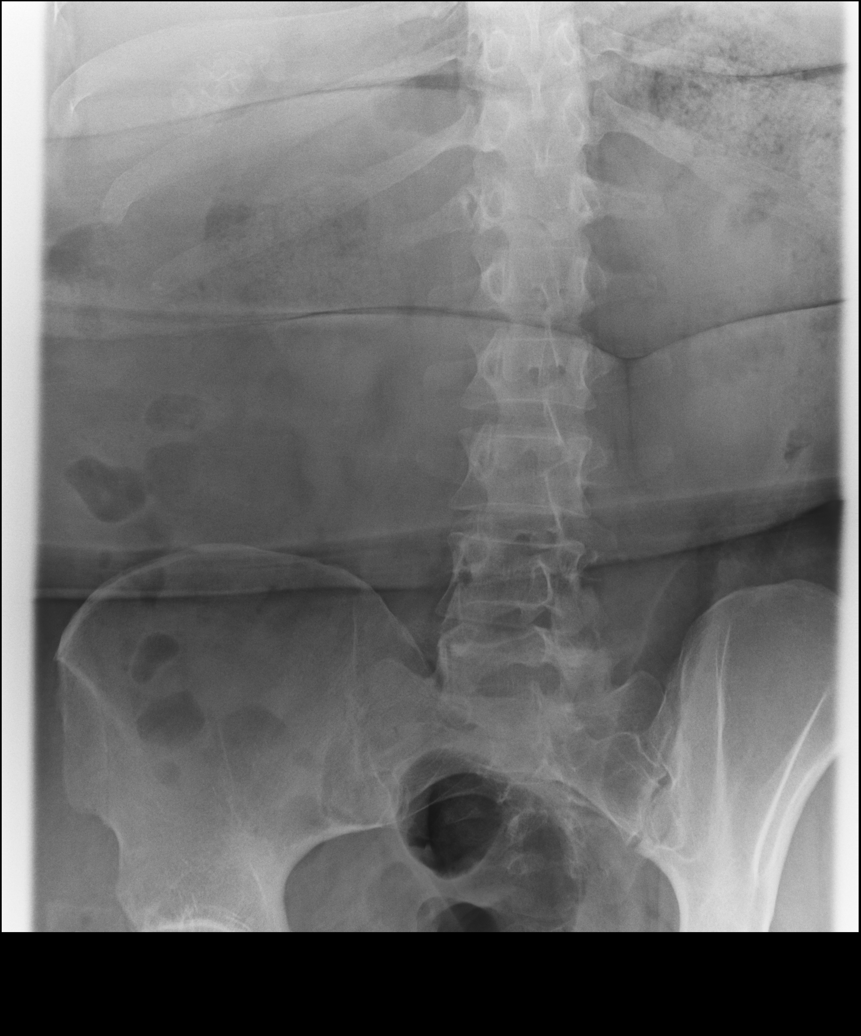

[dg lumbar spine complete 4 +v (3 of 6)]
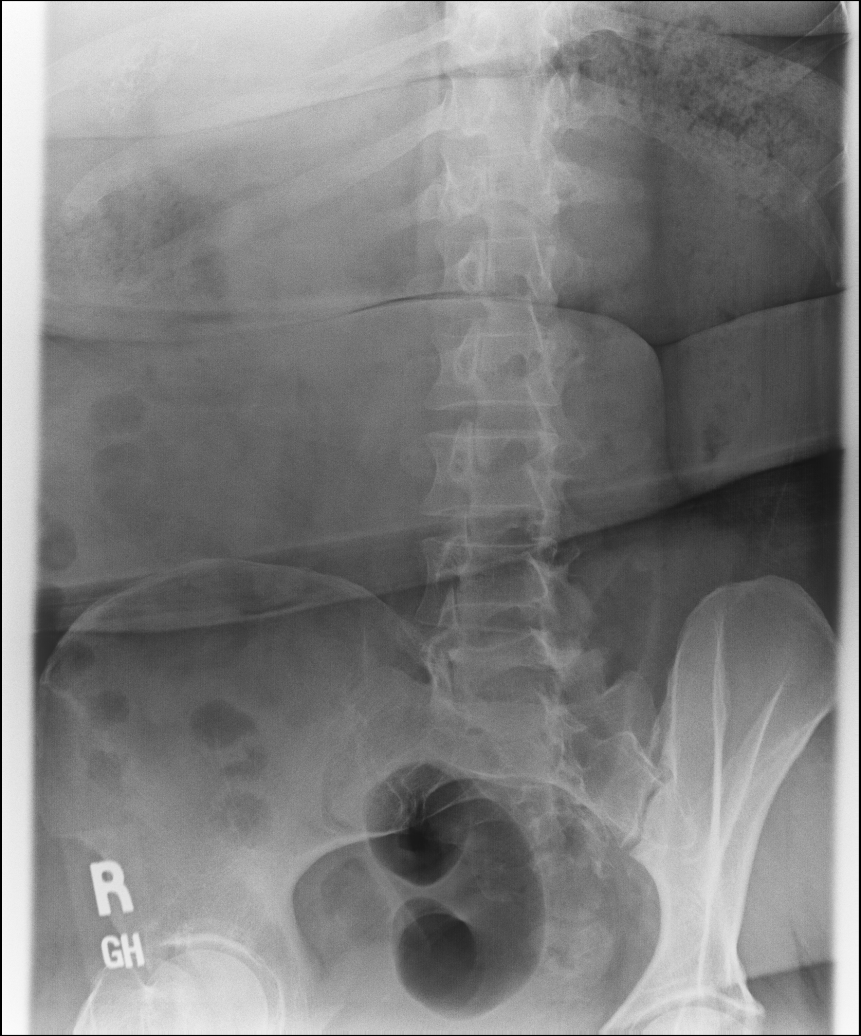

[dg lumbar spine complete 4 +v (4 of 6)]
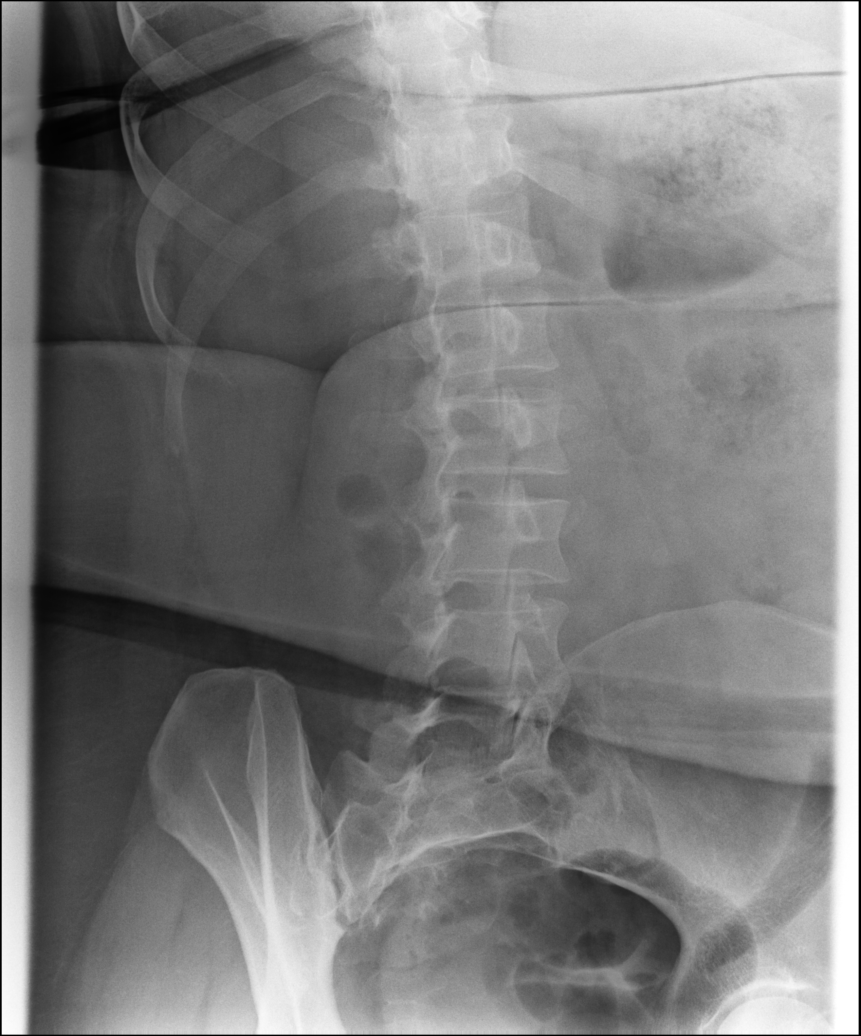

[dg lumbar spine complete 4 +v (5 of 6)]
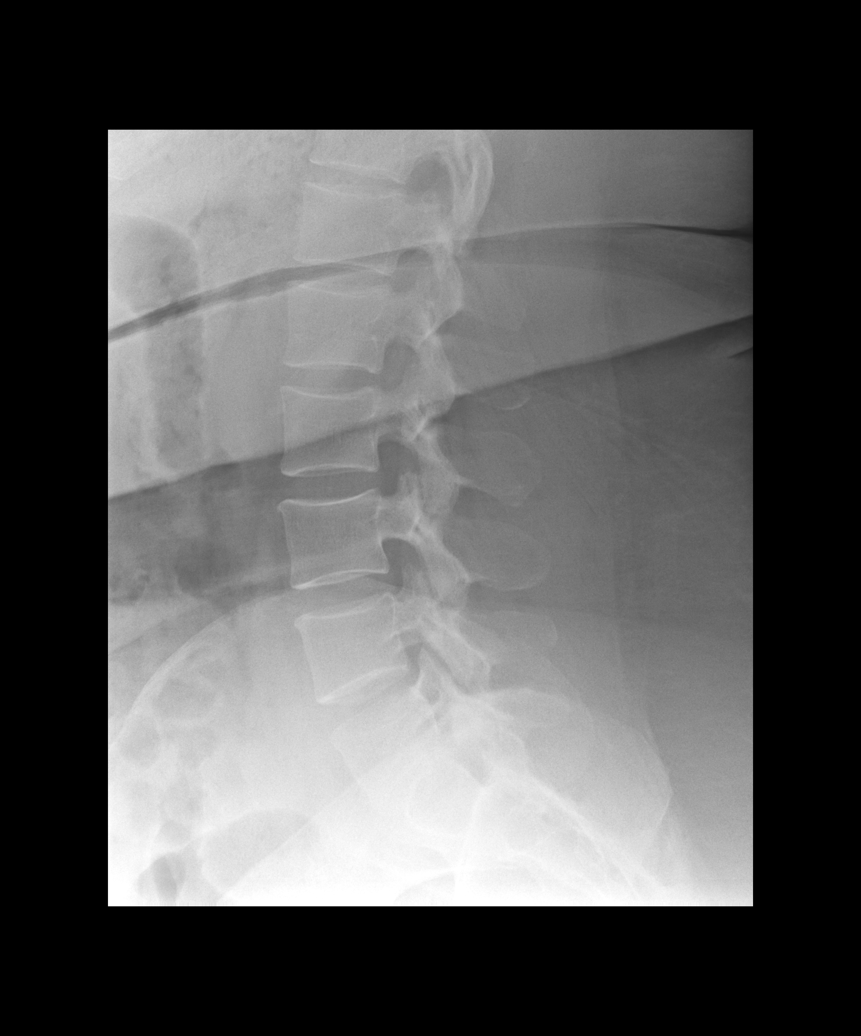

[dg lumbar spine complete 4 +v (6 of 6)]
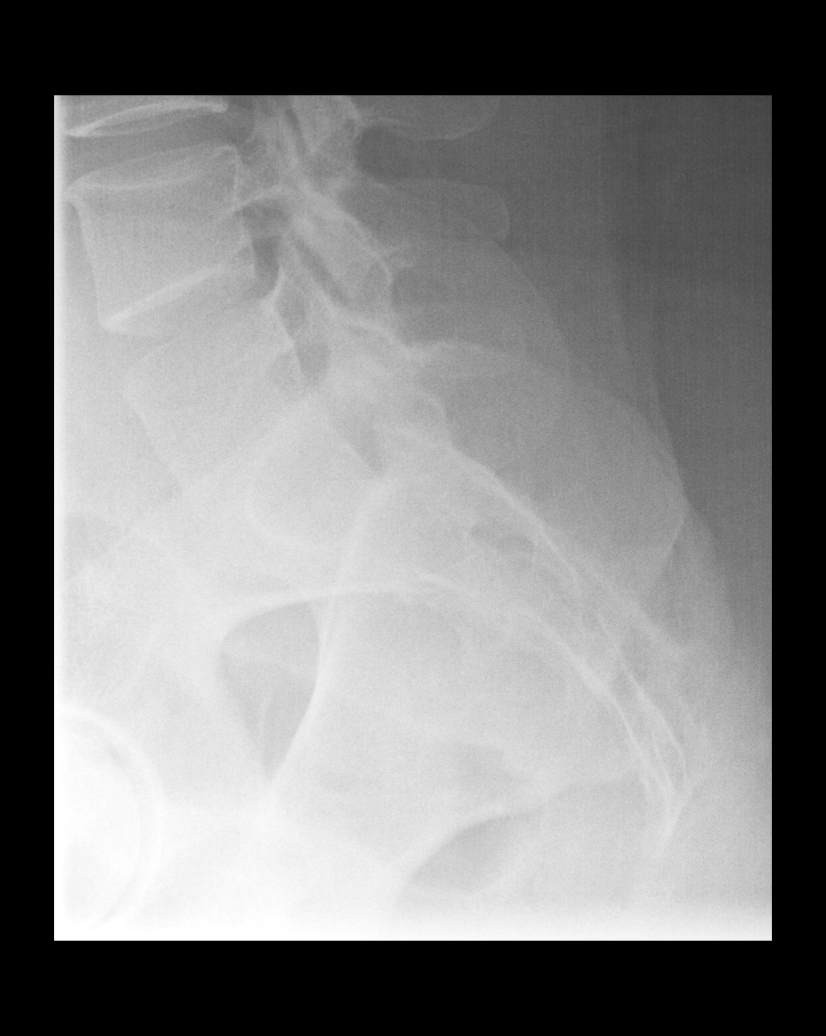

[6 of 6 positions shown; findings below may reference images not displayed]

FINDINGS: No recent fracture is seen. Alignment of posterior margins of
vertebral bodies is unremarkable. There is disc space narrowing at
L5-S1 level. Degenerative changes are noted in facet joints in the
lower lumbar spine. There is mild levoscoliosis. There are multiple
calcific densities in the right upper quadrant suggesting
gallbladder stones.
IMPRESSION: No recent fracture is seen. Alignment of posterior margins of
vertebral bodies is unremarkable. There is disc space narrowing at
L5-S1 level.

Gallbladder stones.

## 2022-04-25 ENCOUNTER — Telehealth (HOSPITAL_COMMUNITY): Payer: Self-pay | Admitting: Physical Medicine & Rehabilitation

## 2022-04-26 ENCOUNTER — Other Ambulatory Visit: Payer: Self-pay

## 2022-04-26 DIAGNOSIS — G43711 Chronic migraine without aura, intractable, with status migrainosus: Secondary | ICD-10-CM

## 2022-04-26 MED ORDER — NORTRIPTYLINE HCL 50 MG PO CAPS: ORAL_CAPSULE | ORAL | 5 refills | Status: DC

## 2022-05-01 ENCOUNTER — Encounter: Payer: Medicare HMO | Admitting: Physical Medicine & Rehabilitation

## 2022-05-31 ENCOUNTER — Ambulatory Visit
Admission: RE | Admit: 2022-05-31 | Discharge: 2022-05-31 | Disposition: A | Discharge status: Home / Self Care | Payer: Medicare HMO | Source: Ambulatory Visit | Attending: Family Medicine | Admitting: Family Medicine

## 2022-05-31 DIAGNOSIS — Z1231 Encounter for screening mammogram for malignant neoplasm of breast: Secondary | ICD-10-CM

## 2022-06-04 ENCOUNTER — Ambulatory Visit (INDEPENDENT_AMBULATORY_CARE_PROVIDER_SITE_OTHER): Payer: Medicare HMO | Admitting: Internal Medicine

## 2022-06-04 ENCOUNTER — Encounter (INDEPENDENT_AMBULATORY_CARE_PROVIDER_SITE_OTHER): Payer: Self-pay | Admitting: Internal Medicine

## 2022-06-04 VITALS — BP 128/84 | HR 80 | Temp 98.1°F | Ht 68.0 in | Wt 382.0 lb

## 2022-06-04 DIAGNOSIS — Z0289 Encounter for other administrative examinations: Secondary | ICD-10-CM

## 2022-06-04 DIAGNOSIS — E7800 Pure hypercholesterolemia, unspecified: Secondary | ICD-10-CM

## 2022-06-04 DIAGNOSIS — Z79899 Other long term (current) drug therapy: Secondary | ICD-10-CM

## 2022-06-04 DIAGNOSIS — R29818 Other symptoms and signs involving the nervous system: Secondary | ICD-10-CM

## 2022-06-04 DIAGNOSIS — I10 Essential (primary) hypertension: Secondary | ICD-10-CM | POA: Diagnosis not present

## 2022-06-04 DIAGNOSIS — Z6841 Body Mass Index (BMI) 40.0 and over, adult: Secondary | ICD-10-CM

## 2022-06-04 NOTE — Assessment & Plan Note (Signed)
Patient is on a number of medications that could cause weight gain and may make weight loss very difficult.  These  include Geodon, duloxetine, Lyrica, propranolol, Pamelor among other.  She will work with prescribing physicians to find weight neutral alternatives if available for indication.

## 2022-06-04 NOTE — Assessment & Plan Note (Signed)
Patient has a high risk phenotype and also symptoms suggestive of disordered sleep breathing.  She has been referred for sleep study recommended the importance of getting this completed.  Losing 15% of body weight may improve condition.

## 2022-06-04 NOTE — Assessment & Plan Note (Signed)
Currently on statin therapy without any adverse effects.  We will check a fasting lipid profile and TSH vitamin D levels with intake labs.

## 2022-06-04 NOTE — Progress Notes (Signed)
Office: [phone]  /  Fax: [phone]   Initial Visit  Tracy Banks was seen in clinic today to evaluate for obesity. She is interested in losing weight to improve overall health and reduce the risk of weight related complications. She presents today to review program treatment options, initial physical assessment, and evaluation.   She was referred by orthopedic surgeon as she needs to lose weight to be able to have right knee replacement.  She reports having advanced osteoarthritis of the right knee and chronic pain affecting her quality of life  She was referred by: Specialist, ortho for knee replacement.  When asked what else they would like to accomplish? She states: Improve energy levels and physical activity, Improve existing medical conditions, and Improve quality of life  Weight history: problems since childhood.  When asked how has your weight affected you? She states: Contributed to medical problems, Contributed to orthopedic problems or mobility issues, Having fatigue, and Having poor endurance  Some associated conditions: Hypertension and Other: suspected OSA, referred for PSGM  Contributing factors: Family history, Disruption of circadian rhythm, Nutritional, Medications, Stress, Reduced physical activity, Eating patterns, and Mental health problems  Weight promoting medications identified: Beta-blockers, Psychotropic medications, Antiepileptics, and Anticholinergics  Current nutrition plan: None  Current level of physical activity: None  Current or previous pharmacotherapy: None  Response to medication: Never tried medications   Past medical history includes:   Past Medical History:  Diagnosis Date   Acid reflux    Anemia    Anxiety    Arthritis    Asthma    Carpal tunnel syndrome    Depression    Eczema    Fibromyalgia    Gout    Hypertension    Migraine    Overactive bladder    Pneumonia    Polyarthralgia      Objective:   BP 128/84    Pulse 80   Temp 98.1 F (36.7 C)   Ht 5\' 8"  (1.727 m)   Wt (!) 382 lb (173.3 kg)   SpO2 96%   BMI 58.08 kg/m  She was weighed on the bioimpedance scale: Body mass index is 58.08 kg/m.  Peak Weight: 430 , Body Fat%:58, Visceral Fat Rating:24, Weight trend over the last 12 months: Decreasing  General:  Alert, oriented and cooperative. Patient is in no acute distress.  Respiratory: Normal respiratory effort, no problems with respiration noted   Gait: able to ambulate independently  Mental Status: Normal mood and affect. Normal behavior. Normal judgment and thought content.   DIAGNOSTIC DATA REVIEWED:  BMET    Component Value Date/Time   NA 143 01/02/2022 1027   NA 143 06/11/2019 1024   K 4.3 01/02/2022 1027   CL 112 (H) 01/02/2022 1027   CO2 25 01/02/2022 1027   GLUCOSE 96 01/02/2022 1027   BUN 13 01/02/2022 1027   BUN 11 06/11/2019 1024   CREATININE 1.25 (H) 01/02/2022 1027   CALCIUM 8.4 (L) 01/02/2022 1027   GFRNONAA 55 (L) 01/02/2022 1027   GFRAA 76 06/11/2019 1024   Lab Results  Component Value Date   HGBA1C 5.3 06/11/2019   No results found for: "INSULIN" CBC    Component Value Date/Time   WBC 10.1 01/02/2022 1027   RBC 4.65 01/02/2022 1027   HGB 11.7 (L) 01/02/2022 1027   HGB 13.6 06/11/2019 1024   HCT 39.1 01/02/2022 1027   HCT 41.8 06/11/2019 1024   PLT 193 01/02/2022 1027   MCV 84.1 01/02/2022 1027  MCV 84 06/11/2019 1024   MCH 25.2 (L) 01/02/2022 1027   MCHC 29.9 (L) 01/02/2022 1027   RDW 17.1 (H) 01/02/2022 1027   RDW 13.5 06/11/2019 1024   Iron/TIBC/Ferritin/ %Sat No results found for: "IRON", "TIBC", "FERRITIN", "IRONPCTSAT" Lipid Panel  No results found for: "CHOL", "TRIG", "HDL", "CHOLHDL", "VLDL", "LDLCALC", "LDLDIRECT" Hepatic Function Panel     Component Value Date/Time   PROT 7.2 06/11/2019 1024   ALBUMIN 4.1 06/11/2019 1024   AST 29 06/11/2019 1024   ALT 28 06/11/2019 1024   ALKPHOS 95 06/11/2019 1024   BILITOT 0.2 06/11/2019 1024       Component Value Date/Time   TSH 1.400 06/11/2019 1024     Assessment and Plan:   Primary hypertension Assessment & Plan: Blood pressure at goal for age and risk category.  On propranolol which may cause weight gain.    Losing 10% of body weight may improve blood pressure control.  We will check renal parameters with intake labs    Suspected sleep apnea Assessment & Plan: Patient has a high risk phenotype and also symptoms suggestive of disordered sleep breathing.  She has been referred for sleep study recommended the importance of getting this completed.  Losing 15% of body weight may improve condition.   Class 3 severe obesity with serious comorbidity and body mass index (BMI) of 50.0 to 59.9 in adult, unspecified obesity type [facility]) Assessment & Plan: We reviewed weight, biometrics, associated medical conditions and contributing factors with patient. She would benefit from weight loss therapy via a modified calorie, low-carb, high-protein nutritional plan tailored to their REE (resting energy expenditure) which will be determined by indirect calorimetry.  We will also assess for cardiometabolic risk and nutritional derangements via fasting serologies at her next appointment.   Pure hypercholesterolemia Assessment & Plan: Currently on statin therapy without any adverse effects.  We will check a fasting lipid profile and TSH vitamin D levels with intake labs.      Polypharmacy Assessment & Plan: Patient is on a number of medications that could cause weight gain and may make weight loss very difficult.  These  include Geodon, duloxetine, Lyrica, propranolol, Pamelor among other.  She will work with prescribing physicians to find weight neutral alternatives if available for indication.         Obesity Treatment / Action Plan:  Patient will work on garnering support from family and friends to begin weight loss journey. Will work on eliminating or reducing the presence  of highly palatable, calorie dense foods in the home. Will complete provided nutritional and psychosocial assessment questionnaire before the next appointment. Will be scheduled for indirect calorimetry to determine resting energy expenditure in a fasting state.  This will allow [place] to create a reduced calorie, high-protein meal plan to promote loss of fat mass while preserving muscle mass. Counseled on the health benefits of losing 5%-15% of total body weight. Was counseled on nutritional approaches to weight loss and benefits of complex carbs and high quality protein as part of nutritional weight management. Was counseled on pharmacotherapy and role as an adjunct in weight management.   Obesity Education Performed Today:  She was weighed on the bioimpedance scale and results were discussed and documented in the synopsis.  We discussed obesity as a disease and the importance of a more detailed evaluation of all the factors contributing to the disease.  We discussed the importance of long term lifestyle changes which include nutrition, exercise and behavioral modifications as well as  the importance of customizing this to her specific health and social needs.  We discussed the benefits of reaching a healthier weight to alleviate the symptoms of existing conditions and reduce the risks of the biomechanical, metabolic and psychological effects of obesity.  Raeghan Paulino appears to be in the action stage of change and states they are ready to start intensive lifestyle modifications and behavioral modifications.  30 minutes was spent today on this visit including the above counseling, pre-visit chart review, and post-visit documentation.  Reviewed by clinician on day of visit: allergies, medications, problem list, medical history, surgical history, family history, social history, and previous encounter notes pertinent to obesity diagnosis.   Thomes Dinning, MD

## 2022-06-04 NOTE — Assessment & Plan Note (Signed)
Blood pressure at goal for age and risk category.  On propranolol which may cause weight gain.    Losing 10% of body weight may improve blood pressure control.  We will check renal parameters with intake labs

## 2022-06-04 NOTE — Assessment & Plan Note (Signed)
We reviewed weight, biometrics, associated medical conditions and contributing factors with patient. She would benefit from weight loss therapy via a modified calorie, low-carb, high-protein nutritional plan tailored to their REE (resting energy expenditure) which will be determined by indirect calorimetry.  We will also assess for cardiometabolic risk and nutritional derangements via fasting serologies at her next appointment. 

## 2022-06-20 ENCOUNTER — Ambulatory Visit (INDEPENDENT_AMBULATORY_CARE_PROVIDER_SITE_OTHER): Payer: Medicare HMO | Admitting: Internal Medicine

## 2022-06-20 ENCOUNTER — Encounter (INDEPENDENT_AMBULATORY_CARE_PROVIDER_SITE_OTHER): Payer: Self-pay | Admitting: Internal Medicine

## 2022-06-20 VITALS — BP 130/84 | HR 66 | Temp 98.0°F | Ht 68.0 in | Wt 379.0 lb

## 2022-06-20 DIAGNOSIS — R0602 Shortness of breath: Secondary | ICD-10-CM | POA: Insufficient documentation

## 2022-06-20 DIAGNOSIS — Z1331 Encounter for screening for depression: Secondary | ICD-10-CM | POA: Diagnosis not present

## 2022-06-20 DIAGNOSIS — E7800 Pure hypercholesterolemia, unspecified: Secondary | ICD-10-CM

## 2022-06-20 DIAGNOSIS — Z79899 Other long term (current) drug therapy: Secondary | ICD-10-CM

## 2022-06-20 DIAGNOSIS — I10 Essential (primary) hypertension: Secondary | ICD-10-CM

## 2022-06-20 DIAGNOSIS — Z6841 Body Mass Index (BMI) 40.0 and over, adult: Secondary | ICD-10-CM

## 2022-06-20 DIAGNOSIS — R5383 Other fatigue: Secondary | ICD-10-CM | POA: Diagnosis not present

## 2022-06-20 DIAGNOSIS — R29818 Other symptoms and signs involving the nervous system: Secondary | ICD-10-CM | POA: Diagnosis not present

## 2022-06-20 NOTE — Assessment & Plan Note (Signed)
Improved.  On propranolol which may contribute to weight gain.  I recommend considering a weight neutral beta-blocker like carvedilol or Bystolic.  Losing 10% of body weight may improve condition.

## 2022-06-20 NOTE — Assessment & Plan Note (Signed)
Patient has a high risk phenotype and also symptoms suggestive of disordered sleep breathing.  She has been referred for sleep study recommended the importance of getting this completed.  Losing 15% of body weight may improve condition. 

## 2022-06-20 NOTE — Assessment & Plan Note (Signed)
Currently on statin therapy without any adverse effects.  We will check fasting lipid profile and assess cardiovascular risk.  Losing 10% of body weight may improve condition

## 2022-06-20 NOTE — Assessment & Plan Note (Signed)
Patient is on a number of medications that could cause weight gain and may make weight loss very difficult.  These  include Geodon, duloxetine, Lyrica, propranolol, Pamelor among other.  She will work with prescribing physicians to find weight neutral alternatives if available for indication. 

## 2022-06-20 NOTE — Progress Notes (Unsigned)
Chief Complaint:   OBESITY Tracy Banks (MR# [id number]) is a 42 y.o. female who presents for evaluation and treatment of obesity and related comorbidities. Current BMI is Body mass index is 57.63 kg/m. Tracy Banks has been struggling with her weight for many years and has been unsuccessful in either losing weight, maintaining weight loss, or reaching her healthy weight goal.  Tracy Banks is currently in the action stage of change and ready to dedicate time achieving and maintaining a healthier weight. Tracy Banks is interested in becoming our patient and working on intensive lifestyle modifications including (but not limited to) diet and exercise for weight loss.  Tracy Banks's habits were reviewed today and are as follows: she thinks her family will eat healthier with her, her desired weight loss is 129 lbs, she has been heavy most of her life, she started gaining weight after having kids, her heaviest weight ever was 430 pounds, she has significant food cravings issues, she snacks frequently in the evenings, she skips meals frequently, she is frequently drinking liquids with calories, she frequently makes poor food choices, she frequently eats larger portions than normal, and she struggles with emotional eating.  Depression Screen Tracy Banks's Food and Mood (modified PHQ-9) score was 15.  Subjective:   1. Other fatigue Tracy Banks admits to daytime somnolence and admits to waking up still tired. Patient has a history of symptoms of daytime fatigue and morning fatigue. Tracy Banks generally gets 5 hours of sleep per night, and states that she has nightime awakenings. Snoring is present. Apneic episodes are present. Epworth Sleepiness Score is 17.   2. SOB (shortness of breath) on exertion [name] notes increasing shortness of breath with exercising and seems to be worsening over time with weight gain. She notes getting out of breath sooner with activity than she used to. This has not gotten worse recently. Tracy Banks  denies shortness of breath at rest or orthopnea.  3. Pure hypercholesterolemia Currently on statin therapy without any adverse effects.  4. Primary hypertension Improved.  On propranolol which may contribute to weight gain.  5. Polypharmacy Patient is on a number of medications that could cause weight gain and may make weight loss very difficult.  These  include Geodon, duloxetine, Lyrica, propranolol, Pamelor among other.  6. Suspected sleep apnea Patient has a high risk phenotype and also symptoms suggestive of disordered sleep breathing.    Assessment/Plan:   1. Other fatigue Blakelie does feel that her weight is causing her energy to be lower than it should be. Fatigue may be related to obesity, depression or many other causes. Labs will be ordered, and in the meanwhile, Tracy Banks will focus on self care including making healthy food choices, increasing physical activity and focusing on stress reduction.  - Vitamin B12 - TSH - VITAMIN D 25 Hydroxy (Vit-D Deficiency, Fractures)  2. SOB (shortness of breath) on exertion Tracy Banks does feel that she gets out of breath more easily that she used to when she exercises. Tracy Banks's shortness of breath appears to be obesity related and exercise induced. She has agreed to work on weight loss and gradually increase exercise to treat her exercise induced shortness of breath. Will continue to monitor closely.  - Comprehensive metabolic panel  3. Pure hypercholesterolemia We will check fasting lipid profile and assess cardiovascular risk.  Losing 10% of body weight may improve condition.  - Lipid Panel With LDL/HDL Ratio  4. Primary hypertension I recommend considering a weight neutral beta-blocker like carvedilol or Bystolic.  Losing 10% of  body weight may improve condition.  5. Polypharmacy She will work with prescribing physicians to find weight neutral alternatives if available for indication.  6. Suspected sleep apnea She has been referred  for sleep study recommended the importance of getting this completed.  Losing 15% of body weight may improve condition.  7. Depression screen Tracy Banks had a positive depression screening. Depression is commonly associated with obesity and often results in emotional eating behaviors. We will monitor this closely and work on CBT to help improve the non-hunger eating patterns. Referral to Psychology may be required if no improvement is seen as she continues in our clinic.  8. Class 3 severe obesity with serious comorbidity and body mass index (BMI) of 50.0 to 59.9 in adult, unspecified obesity type We will check labs today.   - CBC with Differential/Platelet - Hemoglobin A1c - Insulin, random  Tracy Banks is currently in the action stage of change and her goal is to continue with weight loss efforts. I recommend Tracy Banks begin the structured treatment plan as follows:  She has agreed to the Category 3 Plan.  Exercise goals: {MWM EXERCISE RECS:23473}   Behavioral modification strategies: {MWMwtlossdietstrategies3:23432}.  She was informed of the importance of frequent follow-up visits to maximize her success with intensive lifestyle modifications for her multiple health conditions. She was informed we would discuss her lab results at her next visit unless there is a critical issue that needs to be addressed sooner. Tracy Banks agreed to keep her next visit at the agreed upon time to discuss these results.  Objective:   Blood pressure 130/84, pulse 66, temperature 98 F (36.7 C), height 5\' 8"  (1.727 m), weight (!) 379 lb (171.9 kg), last menstrual period 06/18/2022. Body mass index is 57.63 kg/m.  EKG: Normal sinus rhythm, rate 96 BPM.  Indirect Calorimeter completed today shows a VO2 of 273 and a REE of 1886.  Her calculated basal metabolic rate is 99991111 thus her basal metabolic rate is worse than expected.  General: Cooperative, alert, well developed, in no acute distress. HEENT: Conjunctivae and lids  unremarkable. Cardiovascular: Regular rhythm.  Lungs: Normal work of breathing. Neurologic: No focal deficits.   Lab Results  Component Value Date   CREATININE 1.25 (H) 01/02/2022   BUN 13 01/02/2022   NA 143 01/02/2022   K 4.3 01/02/2022   CL 112 (H) 01/02/2022   CO2 25 01/02/2022   Lab Results  Component Value Date   ALT 28 06/11/2019   AST 29 06/11/2019   ALKPHOS 95 06/11/2019   BILITOT 0.2 06/11/2019   Lab Results  Component Value Date   HGBA1C 5.3 06/11/2019   No results found for: "INSULIN" Lab Results  Component Value Date   TSH 1.400 06/11/2019   No results found for: "CHOL", "HDL", "LDLCALC", "LDLDIRECT", "TRIG", "CHOLHDL" Lab Results  Component Value Date   WBC 10.1 01/02/2022   HGB 11.7 (L) 01/02/2022   HCT 39.1 01/02/2022   MCV 84.1 01/02/2022   PLT 193 01/02/2022   No results found for: "IRON", "TIBC", "FERRITIN"  Attestation Statements:   Reviewed by clinician on day of visit: allergies, medications, problem list, medical history, surgical history, family history, social history, and previous encounter notes.  Time spent on visit including pre-visit chart review and post-visit charting and care was 40 minutes.   I, Trixie Dredge, am acting as transcriptionist for Thomes Dinning, MD.  I have reviewed the above documentation for accuracy and completeness, and I agree with the above. - ***

## 2022-06-21 LAB — COMPREHENSIVE METABOLIC PANEL
ALT: 26 IU/L (ref 0–32)
AST: 29 IU/L (ref 0–40)
Albumin/Globulin Ratio: 1.1 — ABNORMAL LOW (ref 1.2–2.2)
Albumin: 4.1 g/dL (ref 3.9–4.9)
Alkaline Phosphatase: 102 IU/L (ref 44–121)
BUN/Creatinine Ratio: 10 (ref 9–23)
BUN: 10 mg/dL (ref 6–24)
Bilirubin Total: 0.3 mg/dL (ref 0.0–1.2)
CO2: 20 mmol/L (ref 20–29)
Calcium: 9.7 mg/dL (ref 8.7–10.2)
Chloride: 105 mmol/L (ref 96–106)
Creatinine, Ser: 1.05 mg/dL — ABNORMAL HIGH (ref 0.57–1.00)
Globulin, Total: 3.8 g/dL (ref 1.5–4.5)
Glucose: 93 mg/dL (ref 70–99)
Potassium: 4.1 mmol/L (ref 3.5–5.2)
Sodium: 144 mmol/L (ref 134–144)
Total Protein: 7.9 g/dL (ref 6.0–8.5)
eGFR: 67 mL/min/{1.73_m2} (ref >59)

## 2022-06-21 LAB — CBC WITH DIFFERENTIAL/PLATELET
Basophils Absolute: 0 10*3/uL (ref 0.0–0.2)
Basos: 0 %
EOS (ABSOLUTE): 0.1 10*3/uL (ref 0.0–0.4)
Eos: 1 %
Hematocrit: 37.4 % (ref 34.0–46.6)
Hemoglobin: 11.4 g/dL (ref 11.1–15.9)
Immature Grans (Abs): 0 10*3/uL (ref 0.0–0.1)
Immature Granulocytes: 0 %
Lymphocytes Absolute: 3.7 10*3/uL — ABNORMAL HIGH (ref 0.7–3.1)
Lymphs: 53 %
MCH: 22.6 pg — ABNORMAL LOW (ref 26.6–33.0)
MCHC: 30.5 g/dL — ABNORMAL LOW (ref 31.5–35.7)
MCV: 74 fL — ABNORMAL LOW (ref 79–97)
Monocytes Absolute: 0.5 10*3/uL (ref 0.1–0.9)
Monocytes: 7 %
Neutrophils Absolute: 2.7 10*3/uL (ref 1.4–7.0)
Neutrophils: 39 %
Platelets: 279 10*3/uL (ref 150–450)
RBC: 5.05 x10E6/uL (ref 3.77–5.28)
RDW: 17.3 % — ABNORMAL HIGH (ref 11.7–15.4)
WBC: 7 10*3/uL (ref 3.4–10.8)

## 2022-06-21 LAB — LIPID PANEL WITH LDL/HDL RATIO
Cholesterol, Total: 98 mg/dL — ABNORMAL LOW (ref 100–199)
HDL: 45 mg/dL (ref >39)
LDL Chol Calc (NIH): 34 mg/dL (ref 0–99)
LDL/HDL Ratio: 0.8 ratio (ref 0.0–3.2)
Triglycerides: 97 mg/dL (ref 0–149)
VLDL Cholesterol Cal: 19 mg/dL (ref 5–40)

## 2022-06-21 LAB — TSH: TSH: 1.5 u[IU]/mL (ref 0.450–4.500)

## 2022-06-21 LAB — HEMOGLOBIN A1C
Est. average glucose Bld gHb Est-mCnc: 117 mg/dL
Hgb A1c MFr Bld: 5.7 % — ABNORMAL HIGH (ref 4.8–5.6)

## 2022-06-21 LAB — VITAMIN B12: Vitamin B-12: 803 pg/mL (ref 232–1245)

## 2022-06-21 LAB — INSULIN, RANDOM: INSULIN: 46.4 u[IU]/mL — ABNORMAL HIGH (ref 2.6–24.9)

## 2022-06-21 LAB — VITAMIN D 25 HYDROXY (VIT D DEFICIENCY, FRACTURES): Vit D, 25-Hydroxy: 29.1 ng/mL — ABNORMAL LOW (ref 30.0–100.0)

## 2022-07-04 ENCOUNTER — Ambulatory Visit (INDEPENDENT_AMBULATORY_CARE_PROVIDER_SITE_OTHER): Payer: Medicare HMO | Admitting: Internal Medicine

## 2022-08-30 ENCOUNTER — Other Ambulatory Visit: Payer: Self-pay | Admitting: Neurology

## 2022-09-07 ENCOUNTER — Other Ambulatory Visit: Payer: Self-pay | Admitting: Neurology

## 2022-10-03 ENCOUNTER — Other Ambulatory Visit: Payer: Self-pay | Admitting: Physical Medicine & Rehabilitation

## 2022-10-25 ENCOUNTER — Other Ambulatory Visit: Payer: Self-pay | Admitting: Neurology

## 2022-10-25 NOTE — Telephone Encounter (Signed)
Requested Prescriptions   Pending Prescriptions Disp Refills   pregabalin (LYRICA) 150 MG capsule [Pharmacy Med Name: PREGABALIN 150MG  CAPSULES] 90 capsule     Sig: TAKE ONE CAPSULE BY MOUTH THREE TIMES DAILY, IN THE MORNING, AT NOON AND AT BEDTIME   Last seen 10/04/21, next appt not scheduled  Dispenses   Dispensed Days Supply Quantity Provider Pharmacy  PREGABALIN 150MG  CAPSULES 08/09/2022 30 90 each Levert Feinstein, MD [facility] #...  PREGABALIN 150MG  CAPSULES 07/06/2022 30 90 each Levert Feinstein, MD [facility] #...  PREGABALIN 150MG  CAPSULES 04/04/2022 30 90 each Levert Feinstein, MD [facility] #.Marland KitchenMarland Kitchen

## 2022-10-29 ENCOUNTER — Other Ambulatory Visit: Payer: Self-pay

## 2022-10-29 DIAGNOSIS — G43711 Chronic migraine without aura, intractable, with status migrainosus: Secondary | ICD-10-CM

## 2022-10-29 NOTE — Addendum Note (Signed)
Addended by: Eather Colas E on: 10/29/2022 01:40 PM   Modules accepted: Orders

## 2022-11-02 ENCOUNTER — Other Ambulatory Visit: Payer: Self-pay | Admitting: Physical Medicine & Rehabilitation

## 2022-11-07 NOTE — Telephone Encounter (Signed)
Pt has scheduled f/u appt on 01/17/23 at 10:00am.

## 2022-11-12 MED ORDER — EMGALITY 120 MG/ML ~~LOC~~ SOAJ: 140.0000 mg | SUBCUTANEOUS | 11 refills | Status: DC

## 2022-11-12 MED ORDER — NORTRIPTYLINE HCL 50 MG PO CAPS: ORAL_CAPSULE | ORAL | 5 refills | Status: DC

## 2022-11-12 NOTE — Addendum Note (Signed)
Addended by: Danne Harbor on: 11/12/2022 09:50 AM   Modules accepted: Orders

## 2022-11-12 NOTE — Telephone Encounter (Signed)
Pt request refill for nortriptyline (PAMELOR) 50 MG capsule and  EMGALITY 120 MG/ML SOAJ sent to  [name] #[id number]

## 2022-11-20 ENCOUNTER — Ambulatory Visit
Admission: EM | Admit: 2022-11-20 | Discharge: 2022-11-20 | Disposition: A | Discharge status: Home / Self Care | Payer: Medicare HMO | Attending: Internal Medicine | Admitting: Internal Medicine

## 2022-11-20 DIAGNOSIS — G43809 Other migraine, not intractable, without status migrainosus: Secondary | ICD-10-CM | POA: Diagnosis not present

## 2022-11-20 MED ORDER — DEXAMETHASONE SODIUM PHOSPHATE 10 MG/ML IJ SOLN
8.0000 mg | Freq: Once | INTRAMUSCULAR | Status: AC
  Administered 2022-11-20: 8 mg via INTRAMUSCULAR

## 2022-11-20 MED ORDER — KETOROLAC TROMETHAMINE 30 MG/ML IJ SOLN
30.0000 mg | Freq: Once | INTRAMUSCULAR | Status: AC
  Administered 2022-11-20: 30 mg via INTRAMUSCULAR
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
## 2022-11-20 NOTE — Discharge Instructions (Addendum)
You were given a Toradol injection in clinic today. Do not take any over the counter NSAID's such as Advil, ibuprofen, Aleve, or naproxen for 24 hours.  You may take tylenol if needed Follow-up with your PCP in 2 days for recheck.  Please go to the emergency room if you develop any worsening symptoms.  I hope you feel better soon!
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
## 2022-12-02 ENCOUNTER — Other Ambulatory Visit: Payer: Self-pay | Admitting: Physical Medicine & Rehabilitation

## 2022-12-02 ENCOUNTER — Other Ambulatory Visit: Payer: Self-pay | Admitting: Neurology

## 2022-12-15 ENCOUNTER — Ambulatory Visit (HOSPITAL_COMMUNITY)
Admission: EM | Admit: 2022-12-15 | Discharge: 2022-12-15 | Disposition: A | Discharge status: Home / Self Care | Payer: Medicare HMO

## 2022-12-15 ENCOUNTER — Encounter (HOSPITAL_COMMUNITY): Payer: Self-pay

## 2022-12-15 ENCOUNTER — Ambulatory Visit (INDEPENDENT_AMBULATORY_CARE_PROVIDER_SITE_OTHER): Payer: Medicare HMO

## 2022-12-15 DIAGNOSIS — J069 Acute upper respiratory infection, unspecified: Secondary | ICD-10-CM | POA: Diagnosis not present

## 2022-12-15 DIAGNOSIS — R051 Acute cough: Secondary | ICD-10-CM | POA: Diagnosis not present

## 2022-12-15 MED ORDER — PREDNISONE 20 MG PO TABS: 40.0000 mg | ORAL_TABLET | Freq: Every day | ORAL | 0 refills | Status: AC

## 2022-12-15 MED ORDER — DOXYCYCLINE HYCLATE 100 MG PO CAPS: 100.0000 mg | ORAL_CAPSULE | Freq: Two times a day (BID) | ORAL | 0 refills | Status: DC

## 2022-12-15 MED ORDER — BENZONATATE 100 MG PO CAPS: 100.0000 mg | ORAL_CAPSULE | Freq: Three times a day (TID) | ORAL | 0 refills | Status: DC

## 2022-12-15 NOTE — ED Provider Notes (Signed)
[facility]    CSN: [id number] Arrival date & time: 12/15/22  1626      History   Chief Complaint Chief Complaint  Patient presents with   Cough    HPI Tracy Banks is a 42 y.o. female.   Patient presents with dry cough, nasal drainage, congestion, sore throat, headache, and nausea x 2 weeks.  Patient also endorses some mild shortness of breath. Patient denies taking anything over-the-counter for her symptoms.  Denies abdominal pain, vomiting, diarrhea and known fever.   Cough Associated symptoms: headaches, rhinorrhea, shortness of breath and sore throat   Associated symptoms: no chest pain, no chills and no fever     Past Medical History:  Diagnosis Date   Acid reflux    Anemia    Anxiety    Arthritis    Asthma    Back pain    Carpal tunnel syndrome    Depression    Eczema    Fibromyalgia    Gout    History of swelling of feet    Hyperlipidemia    Hypertension    Incontinence    Lupus (HCC)    Migraine    Osteoarthritis    Overactive bladder    Pneumonia    Polyarthralgia    Vitamin D deficiency     Patient Active Problem List   Diagnosis Date Noted   Other fatigue 06/20/2022   SOB (shortness of breath) on exertion 06/20/2022   Depression screen 06/20/2022   Primary hypertension 06/04/2022   Suspected sleep apnea 06/04/2022   Class 3 severe obesity with serious comorbidity and body mass index (BMI) of 50.0 to 59.9 in adult (HCC) 06/04/2022   Pure hypercholesterolemia 06/04/2022   Polypharmacy 06/04/2022   Excessive sleepiness 04/27/2021   Intractable chronic migraine without aura and with status migrainosus 05/23/2020   Blurry vision 05/23/2020   Paresthesia 06/11/2019   Chronic migraine 05/05/2018   Fibromyalgia 11/05/2016    Past Surgical History:  Procedure Laterality Date   ANKLE SURGERY Bilateral    CHONDROPLASTY Right 01/04/2022   Procedure: KNEE ARTHROSCOPY CHONDROPLASTY;  Surgeon: Eugenia Mcalpine, MD;  Location: [facility];  Service: Orthopedics;  Laterality: Right;  knee block  60   KNEE ARTHROSCOPY WITH LATERAL MENISECTOMY Right 01/04/2022   Procedure: KNEE ARTHROSCOPY WITH PARTIAL LATERAL MENISECTOMY;  Surgeon: Eugenia Mcalpine, MD;  Location: [facility];  Service: Orthopedics;  Laterality: Right;  knee block 60   LEG SURGERY Right    Muscle stretched   NEUROMA SURGERY      OB History     Gravida  4   Para  4   Term      Preterm      AB      Living         SAB      IAB      Ectopic      Multiple      Live Births               Home Medications    Prior to Admission medications   Medication Sig Start Date End Date Taking? Authorizing Provider  benzonatate (TESSALON) 100 MG capsule Take 1 capsule (100 mg total) by mouth every 8 (eight) hours. 12/15/22  Yes Susann Givens, Rachal Dvorsky A, NP  doxycycline (VIBRAMYCIN) 100 MG capsule Take 1 capsule (100 mg total) by mouth 2 (two) times daily. 12/15/22  Yes Susann Givens, Tereka Thorley A, NP  HYDROcodone-acetaminophen (NORCO/VICODIN) 5-325 MG tablet Take 1-2 tablets by mouth  every 6 (six) hours as needed. 04/04/22  Yes [provider]  ibuprofen (ADVIL) 800 MG tablet Take 800 mg by mouth every 8 (eight) hours as needed. 12/12/22  Yes [provider]  predniSONE (DELTASONE) 20 MG tablet Take 2 tablets (40 mg total) by mouth daily for 5 days. 12/15/22 12/20/22 Yes Susann Givens, Arly Salminen A, NP  buPROPion (WELLBUTRIN SR) 150 MG 12 hr tablet Take 1 tablet by mouth daily.    [provider]  buPROPion (WELLBUTRIN XL) 300 MG 24 hr tablet Take 300 mg by mouth daily. 01/03/22   [provider]  Cholecalciferol ([employer] VITAMIN D3) 25 MCG (1000 UT) CHEW Chew by mouth.    [provider]  DULoxetine (CYMBALTA) 30 MG capsule Take 1 capsule (30 mg total) by mouth daily. 03/26/22 03/26/23  Fanny Dance, MD  DULoxetine (CYMBALTA) 60 MG capsule TAKE 1 CAPSULE(60 MG) BY MOUTH DAILY 11/03/22   Fanny Dance, MD  EMGALITY 120 MG/ML SOAJ Inject  140 mg into the skin every 30 (thirty) days. 11/12/22   Glean Salvo, NP  hydroxychloroquine (PLAQUENIL) 200 MG tablet Take 200 mg by mouth 2 (two) times daily.    [provider]  nortriptyline (PAMELOR) 50 MG capsule TAKE 2 CAPSULES(100 MG) BY MOUTH AT BEDTIME 11/12/22   Glean Salvo, NP  nystatin (MYCOSTATIN) 100000 UNIT/ML suspension     [provider]  omeprazole (PRILOSEC) 40 MG capsule Take 40 mg by mouth daily.    [provider]  pregabalin (LYRICA) 150 MG capsule Take 1 capsule (150 mg total) by mouth in the morning, at noon, and at bedtime. 04/04/22   Levert Feinstein, MD  propranolol (INDERAL) 40 MG tablet TAKE 1 TABLET(40 MG) BY MOUTH TWICE DAILY 12/03/22   Levert Feinstein, MD  rosuvastatin (CRESTOR) 10 MG tablet Take 10 mg by mouth daily. 11/15/21   [provider]  tolterodine (DETROL LA) 4 MG 24 hr capsule Take 4 mg by mouth daily. 02/05/21   [provider]  topiramate (TOPAMAX) 100 MG tablet One in am/ 2 at night 04/27/21   Levert Feinstein, MD  torsemide (DEMADEX) 20 MG tablet Take 20 mg by mouth daily.    [provider]  traMADol (ULTRAM) 50 MG tablet Take 50 mg by mouth daily.    [provider]  ziprasidone (GEODON) 20 MG capsule Take 20 mg by mouth See admin instructions. Take with 80 mg for a total of 100 mg daily 04/03/21   [provider]  ziprasidone (GEODON) 80 MG capsule Take 80 mg by mouth See admin instructions. Take with 20 mg for a total of 100 mg daily 12/04/21   [provider]  zolpidem (AMBIEN) 5 MG tablet Take 1 tablet by mouth at bedtime as needed.    [provider]    Family History Family History  Problem Relation Age of Onset   Sarcoidosis Mother    Migraines Mother    Hypertension Mother    Osteoarthritis Mother    Diabetes Mother    Fibromyalgia Mother    Depression Mother    Sleep apnea Mother    Obesity Mother    Healthy Father     Social History Social History    Tobacco Use   Smoking status: Every Day    Current packs/day: 0.25    Average packs/day: 0.3 packs/day for 2.0 years (0.5 ttl pk-yrs)    Types: Cigarettes   Smokeless tobacco: Never   Tobacco comments:    1  pack per week  Vaping Use   Vaping status: Never Used  Substance Use Topics   Alcohol use: Yes    Comment: occ   Drug use: No     Allergies   Penicillins   Review of Systems Review of Systems  Constitutional:  Positive for fatigue. Negative for chills and fever.  HENT:  Positive for congestion, rhinorrhea and sore throat. Negative for sinus pressure, sinus pain and trouble swallowing.   Respiratory:  Positive for cough and shortness of breath. Negative for chest tightness.   Cardiovascular:  Negative for chest pain.  Gastrointestinal:  Positive for nausea. Negative for abdominal pain, diarrhea and vomiting.  Neurological:  Positive for headaches. Negative for dizziness, weakness and light-headedness.  Psychiatric/Behavioral:  Negative for confusion.      Physical Exam Triage Vital Signs ED Triage Vitals  Encounter Vitals Group     BP 12/15/22 1637 110/75     Systolic BP Percentile --      Diastolic BP Percentile --      Pulse Rate 12/15/22 1637 97     Resp 12/15/22 1637 16     Temp 12/15/22 1637 98.2 F (36.8 C)     Temp Source 12/15/22 1637 Oral     SpO2 12/15/22 1637 98 %     Weight 12/15/22 1637 (!) 368 lb (166.9 kg)     Height 12/15/22 1637 5\' 9"  (1.753 m)     Head Circumference --      Peak Flow --      Pain Score 12/15/22 1636 8     Pain Loc --      Pain Education --      Exclude from Growth Chart --    No data found.  Updated Vital Signs BP 110/75 (BP Location: Right Arm)   Pulse 97   Temp 98.2 F (36.8 C) (Oral)   Resp 16   Ht 5\' 9"  (1.753 m)   Wt (!) 368 lb (166.9 kg)   LMP 12/13/2022 (Exact Date)   SpO2 98%   BMI 54.34 kg/m   Visual Acuity Right Eye Distance:   Left Eye Distance:   Bilateral Distance:    Right Eye Near:    Left Eye Near:    Bilateral Near:     Physical Exam Vitals and nursing note reviewed.  Constitutional:      General: She is awake. She is not in acute distress.    Appearance: Normal appearance. She is well-developed. She is obese. She is not ill-appearing, toxic-appearing or diaphoretic.  HENT:     Right Ear: Tympanic membrane, ear canal and external ear normal.     Left Ear: Tympanic membrane, ear canal and external ear normal.     Nose: Congestion and rhinorrhea present.     Right Sinus: No maxillary sinus tenderness or frontal sinus tenderness.     Left Sinus: No maxillary sinus tenderness or frontal sinus tenderness.     Mouth/Throat:     Mouth: Mucous membranes are moist.     Pharynx: Posterior oropharyngeal erythema and postnasal drip present. No pharyngeal swelling or oropharyngeal exudate.     Tonsils: No tonsillar exudate.  Eyes:     Extraocular Movements: Extraocular movements intact.     Pupils: Pupils are equal, round, and reactive to light.  Cardiovascular:     Rate and Rhythm: Normal rate.     Heart sounds: Normal heart sounds.  Pulmonary:     Effort: Pulmonary effort is normal.  Breath sounds: Examination of the left-lower field reveals decreased breath sounds. Decreased breath sounds present. No wheezing, rhonchi or rales.  Musculoskeletal:        General: Normal range of motion.     Cervical back: Normal range of motion.  Skin:    General: Skin is warm and dry.  Neurological:     General: No focal deficit present.     Mental Status: She is alert and oriented to person, place, and time.  Psychiatric:        Behavior: Behavior is cooperative.      UC Treatments / Results  Labs (all labs ordered are listed, but only abnormal results are displayed) Labs Reviewed - No data to display  EKG   Radiology DG Chest 2 View  Result Date: 12/15/2022 CLINICAL DATA:  Shortness of breath and cough. EXAM: CHEST - 2 VIEW COMPARISON:  Chest radiograph dated  12/02/2017 FINDINGS: The no focal consolidation, pleural effusion, or pneumothorax. The cardiac silhouette is within normal limits. No acute osseous pathology. IMPRESSION: No active cardiopulmonary disease. Electronically Signed   By: Elgie Collard M.D.   On: 12/15/2022 18:11    Procedures Procedures (including critical care time)  Medications Ordered in UC Medications - No data to display  Initial Impression / Assessment and Plan / UC Course  I have reviewed the triage vital signs and the nursing notes.  Pertinent labs & imaging results that were available during my care of the patient were reviewed by me and considered in my medical decision making (see chart for details).     Patient presented with 2-week history of dry cough, nasal drainage, congestion, sore throat, headache, and mild nausea.  Patient also endorses some mild shortness of breath.  Denies abdominal pain, vomiting, diarrhea, known fever, and chest pain.   Upon assessment mild erythema to oropharynx noted, congestion and rhinorrhea present.  Decreased breath sounds in the left lower lung.  Chest x-ray ordered to rule out pneumonia. Chest X-ray revealed no active cardiopulmonary disease.   Prescribe doxycycline for upper respiratory infection due to azithromycin interfering with prescribed Geodon and allergy to penicillins.  Prescribed short course of steroids.  Prescribed Tessalon as needed for cough.  Discussed other over-the-counter remedies.  Discussed follow-up, and, emergency department precautions. Final Clinical Impressions(s) / UC Diagnoses   Final diagnoses:  Acute upper respiratory infection  Acute cough     Discharge Instructions      Start taking doxycycline twice daily for 10 days for upper respiratory infection.  You can take Tessalon every 8 hours as needed for cough.  Have also prescribed you a short course of steroids that you will take daily for 5 days.  If your symptoms persist return here for  reevaluation.  If you develop trouble breathing, chest pain, high fevers unrelieved by medication please seek immediate medical treatment in the ER.     ED Prescriptions     Medication Sig Dispense Auth. Provider   predniSONE (DELTASONE) 20 MG tablet Take 2 tablets (40 mg total) by mouth daily for 5 days. 10 tablet Wynonia Lawman A, NP   benzonatate (TESSALON) 100 MG capsule Take 1 capsule (100 mg total) by mouth every 8 (eight) hours. 21 capsule Wynonia Lawman A, NP   doxycycline (VIBRAMYCIN) 100 MG capsule Take 1 capsule (100 mg total) by mouth 2 (two) times daily. 20 capsule Wynonia Lawman A, NP      PDMP not reviewed this encounter.   Wynonia Lawman A, NP 12/15/22 [phone]

## 2022-12-15 NOTE — Discharge Instructions (Signed)
Start taking doxycycline twice daily for 10 days for upper respiratory infection.  You can take Tessalon every 8 hours as needed for cough.  Have also prescribed you a short course of steroids that you will take daily for 5 days.  If your symptoms persist return here for reevaluation.  If you develop trouble breathing, chest pain, high fevers unrelieved by medication please seek immediate medical treatment in the ER.

## 2022-12-15 NOTE — ED Triage Notes (Signed)
Patient here today with c/o cough, nasal drainage, congestion, ST, headache, and nausea X 2 weeks. Denies taking anything OTC for symptoms. No sick contacts. No recent travel.

## 2022-12-25 ENCOUNTER — Encounter (INDEPENDENT_AMBULATORY_CARE_PROVIDER_SITE_OTHER): Payer: Self-pay | Admitting: Internal Medicine

## 2022-12-25 ENCOUNTER — Ambulatory Visit (INDEPENDENT_AMBULATORY_CARE_PROVIDER_SITE_OTHER): Payer: Medicare HMO | Admitting: Internal Medicine

## 2022-12-25 VITALS — BP 109/76 | HR 96 | Temp 98.0°F | Ht 69.0 in | Wt 364.0 lb

## 2022-12-25 DIAGNOSIS — E559 Vitamin D deficiency, unspecified: Secondary | ICD-10-CM | POA: Diagnosis not present

## 2022-12-25 DIAGNOSIS — Z6841 Body Mass Index (BMI) 40.0 and over, adult: Secondary | ICD-10-CM

## 2022-12-25 DIAGNOSIS — R948 Abnormal results of function studies of other organs and systems: Secondary | ICD-10-CM | POA: Diagnosis not present

## 2022-12-25 DIAGNOSIS — Z59819 Housing instability, housed unspecified: Secondary | ICD-10-CM | POA: Insufficient documentation

## 2022-12-25 DIAGNOSIS — R635 Abnormal weight gain: Secondary | ICD-10-CM | POA: Insufficient documentation

## 2022-12-25 DIAGNOSIS — T50905A Adverse effect of unspecified drugs, medicaments and biological substances, initial encounter: Secondary | ICD-10-CM | POA: Insufficient documentation

## 2022-12-25 DIAGNOSIS — E66813 Obesity, class 3: Secondary | ICD-10-CM

## 2022-12-25 DIAGNOSIS — K529 Noninfective gastroenteritis and colitis, unspecified: Secondary | ICD-10-CM | POA: Diagnosis not present

## 2022-12-25 DIAGNOSIS — R29818 Other symptoms and signs involving the nervous system: Secondary | ICD-10-CM | POA: Diagnosis not present

## 2022-12-25 DIAGNOSIS — R7303 Prediabetes: Secondary | ICD-10-CM | POA: Insufficient documentation

## 2022-12-25 MED ORDER — VITAMIN D3 50 MCG (2000 UT) PO CAPS
2000.0000 [IU] | ORAL_CAPSULE | Freq: Every day | ORAL | Status: AC
Start: 2022-12-25 — End: ?

## 2022-12-25 NOTE — Assessment & Plan Note (Signed)
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  Patient aware of disease state and risk of progression. This may contribute to abnormal cravings, fatigue and diabetic complications without having diabetes.   We have discussed treatment options which include: losing 7 to 10% of body weight, increasing physical activity to a goal of 150 minutes a week at moderate intensity.  Advised to maintain a diet low on simple and processed carbohydrates.  She may also be a candidate for pharmacoprophylaxis with metformin or incretin mimetic.

## 2022-12-25 NOTE — Assessment & Plan Note (Signed)
Patient reports having loose stools for "many years associated with intake of any meals".  We discussed looking at FODMAP to see if she has a food intolerance difficult to gauge as she is hard to interview.  She has also not been evaluated by gastroenterology and may need to follow-up with them.  I also encouraged her to schedule an appointment with her PCP to focus on her loose stools.  Her labs do not suggest any serological signs of malnutrition.

## 2022-12-25 NOTE — Assessment & Plan Note (Signed)
See obesity treatment plan.  Patient with multiple barriers and expecting simplicity.  Had not followed up after initial visit.  Given plan again with instructions she will focus on doing 3 meals a day, increasing protein intake (we discussed economical ways), increasing water and volume of physical activity.  Her housing situation is an obvious obstacle.  She was also given the eating out guide.  I also showed her how to use AI to look at healthier options of fast food places.

## 2022-12-25 NOTE — Assessment & Plan Note (Signed)
Affecting medical care.  She is working with a Child psychotherapist to obtain stable housing.  She has 3 children

## 2022-12-25 NOTE — Assessment & Plan Note (Signed)
We reviewed vitamin D levels she is currently taking at 1000 units daily advised to increase to 2000.

## 2022-12-25 NOTE — Assessment & Plan Note (Signed)
Patient on multiple obesogenic medications.  We discussed how these medications may cause weight gain and affect her weight loss.  She may benefit from metformin to offset some of the weight gain associated with psychotropic medication we will hold off pharmacotherapy until she is following up as recommended.

## 2022-12-25 NOTE — Progress Notes (Signed)
[address] [place], [place], [place] [address] Office: [phone]  /  Fax: [phone]   WEIGHT SUMMARY AND BIOMETRICS  Vitals Temp: 98 F (36.7 C) BP: 109/76 Pulse Rate: 96 SpO2: 98 %   Anthropometric Measurements Height: 5\' 9"  (1.753 m) Weight: (!) 364 lb (165.1 kg) BMI (Calculated): 53.73 Weight at Last Visit: 379 lb Weight Lost Since Last Visit: 15 lb Weight Gained Since Last Visit: 0 lb Starting Weight: 379 lb Total Weight Loss (lbs): 15 lb (6.804 kg)   Body Composition  Body Fat %: 54.4 % Fat Mass (lbs): 198 lbs Muscle Mass (lbs): 157.6 lbs Total Body Water (lbs): 125.6 lbs Visceral Fat Rating : 21     RMR: 1886  Today's Visit #: 2   Starting Date: 06/20/22    Chief Complaint: OBESITY  HPI  Discussed the use of AI scribe software for clinical note transcription with the patient, who gave verbal consent to proceed.  History of Present Illness    Patient presents today after a period of absence following her initial appointment due to personal problems and unstable housing.  She is now living in a hotel with her 3 children.  She reports not having started the recommended reduced-calorie meal plan yet, but has been engaging in physical activity, including walking and climbing stairs.   She has lost weight but does not know why.  She has a limited kitchen and has not been cooking she therefore relies on takeout and prepackaged foods for most of her nutrition.  The patient is currently on multiple medications, including duloxetine, Ambien, Geodon, topiramate, and Lyrica. She reports a recent change in medication, with the discontinuation of a drug starting with 'C' and the introduction of a sleep medication. She is also on rovastatin for cholesterol management and vitamin D supplements for low vitamin D levels.  The patient reports a pattern of skipping breakfast and lunch, consuming only one meal a day, typically dinner. She expresses a preference for  fruits over junk food and reports consuming large amounts of fruits. She acknowledges a lack of protein in her diet and expresses a willingness to incorporate more protein-rich foods.  The patient also reports frequent loose stools, which she believes may be related to her diet or possibly a side effect of her medications. She reports this issue occurs regardless of the type of food consumed, including restaurant food and home-cooked meals. She expresses concern that this issue may impact her ability to adhere to a diet plan.  The patient is currently living in a hotel due to homelessness and is the primary caregiver for her three children. She expresses a strong desire to lose weight, not only for general health but also in preparation for a necessary surgery. She acknowledges the complexity of her situation, including her slow metabolism, the potential impact of her medications on weight gain, and the possibility of untreated sleep apnea. She expresses a willingness to make dietary changes and increase physical activity to achieve her weight loss goals.          Barriers identified: multiple competing priorities, cost of healthy foods, having difficulty preparing healthy meals, having difficulty with meal prep and planning, need for convenience or prepackaged foods, limited food variation or intolerances, low volume of physical acitivity, medical comorbidities, difficulty implementing reduced calorie nutrition plan, presence of obesogenic drugs, inadequate sleep, slow metabolism for age, sleep apnea, and unstable housing without having ability to prepare meals .   Pharmacotherapy for weight loss: She is currently  taking no anti-obesity medication.    ASSESSMENT AND PLAN  TREATMENT PLAN FOR OBESITY:  Recommended Dietary Goals  Kimiyah is currently in the action stage of change. As such, her goal is to continue weight management plan. She has agreed to: continue current plan  Behavioral  Intervention  We discussed the following Behavioral Modification Strategies today: continue to work on maintaining a reduced calorie state, getting the recommended amount of protein, incorporating whole foods, making healthy choices, staying well hydrated and practicing mindfulness when eating..  Additional resources provided today: eating out guide, Cat 3 plan  Recommended Physical Activity Goals  Arie has been advised to work up to 150 minutes of moderate intensity aerobic activity a week and strengthening exercises 2-3 times per week for cardiovascular health, weight loss maintenance and preservation of muscle mass.   She has agreed to :  Think about enjoyable ways to increase daily physical activity and overcoming barriers to exercise and Increase physical activity in their day and reduce sedentary time (increase NEAT).  Pharmacotherapy We discussed various medication options to help [facility] with her weight loss efforts and we both agreed to : continue with nutritional and behavioral strategies  ASSOCIATED CONDITIONS ADDRESSED TODAY  Suspected sleep apnea Assessment & Plan: Patient has a high risk phenotype and also symptoms suggestive of disordered sleep breathing.  She had been referred for sleep study and was scheduled for consultation but did not follow through.  We again reminded her that if she has untreated sleep apnea I will be very difficult for her to lose and maintain her weight.  She is also on several CNS depressing drugs which may worsen her sleep apnea.   Class 3 severe obesity with serious comorbidity and body mass index (BMI) of 50.0 to 59.9 in adult, unspecified obesity type [facility]) Assessment & Plan: See obesity treatment plan.  Patient with multiple barriers and expecting simplicity.  Had not followed up after initial visit.  Given plan again with instructions she will focus on doing 3 meals a day, increasing protein intake (we discussed economical ways), increasing  water and volume of physical activity.  Her housing situation is an obvious obstacle.  She was also given the eating out guide.  I also showed her how to use AI to look at healthier options of fast food places.   Weight gain due to medication Assessment & Plan: Patient on multiple obesogenic medications.  We discussed how these medications may cause weight gain and affect her weight loss.  She may benefit from metformin to offset some of the weight gain associated with psychotropic medication we will hold off pharmacotherapy until she is following up as recommended.   Abnormal metabolism Assessment & Plan: Patient has a slower than predicted metabolism. IC 1886 vs. calculated 2465. This may contribute to weight gain, chronic fatigue and difficulty losing weight.  Likely secondary to low volume of physical activity, obesogenic medications, genetics and possibly untreated sleep apnea.  We reviewed measures to improve metabolism including not skipping meals, progressive strengthening exercises, increasing protein intake at every meal and maintaining adequate hydration and sleep.     Vitamin D deficiency Assessment & Plan: We reviewed vitamin D levels she is currently taking at 1000 units daily advised to increase to 2000.  Orders: -     Vitamin D3; Take 1 capsule (2,000 Units total) by mouth daily.  Housing situation unstable Assessment & Plan: Affecting medical care.  She is working with a Child psychotherapist to obtain stable housing.  She has 3 children   Chronic diarrhea Assessment & Plan: Patient reports having loose stools for "many years associated with intake of any meals".  We discussed looking at FODMAP to see if she has a food intolerance difficult to gauge as she is hard to interview.  She has also not been evaluated by gastroenterology and may need to follow-up with them.  I also encouraged her to schedule an appointment with her PCP to focus on her loose stools.  Her labs do not  suggest any serological signs of malnutrition.   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  Patient aware of disease state and risk of progression. This may contribute to abnormal cravings, fatigue and diabetic complications without having diabetes.   We have discussed treatment options which include: losing 7 to 10% of body weight, increasing physical activity to a goal of 150 minutes a week at moderate intensity.  Advised to maintain a diet low on simple and processed carbohydrates.  She may also be a candidate for pharmacoprophylaxis with metformin or incretin mimetic.       PHYSICAL EXAM:  Blood pressure 109/76, pulse 96, temperature 98 F (36.7 C), height 5\' 9"  (1.753 m), weight (!) 364 lb (165.1 kg), last menstrual period 12/13/2022, SpO2 98%. Body mass index is 53.75 kg/m.  General: She is overweight, cooperative, alert, well developed, and in no acute distress. PSYCH: Has normal mood, affect and thought process.   HEENT: EOMI, sclerae are anicteric. Lungs: Normal breathing effort, no conversational dyspnea. Extremities: No edema.  Neurologic: No gross sensory or motor deficits. No tremors or fasciculations noted.    DIAGNOSTIC DATA REVIEWED:  BMET    Component Value Date/Time   NA 144 06/20/2022 1454   K 4.1 06/20/2022 1454   CL 105 06/20/2022 1454   CO2 20 06/20/2022 1454   GLUCOSE 93 06/20/2022 1454   GLUCOSE 96 01/02/2022 1027   BUN 10 06/20/2022 1454   CREATININE 1.05 (H) 06/20/2022 1454   CALCIUM 9.7 06/20/2022 1454   GFRNONAA 55 (L) 01/02/2022 1027   GFRAA 76 06/11/2019 1024   Lab Results  Component Value Date   HGBA1C 5.7 (H) 06/20/2022   HGBA1C 5.3 06/11/2019   Lab Results  Component Value Date   INSULIN 46.4 (H) 06/20/2022   Lab Results  Component Value Date   TSH 1.500 06/20/2022    CBC    Component Value Date/Time   WBC 7.0 06/20/2022 1454   WBC 10.1 01/02/2022 1027   RBC 5.05 06/20/2022 1454   RBC 4.65 01/02/2022 1027   HGB 11.4 06/20/2022 1454   HCT 37.4 06/20/2022 1454   PLT 279 06/20/2022 1454   MCV 74 (L) 06/20/2022 1454   MCH 22.6 (L) 06/20/2022 1454   MCH 25.2 (L) 01/02/2022 1027   MCHC 30.5 (L) 06/20/2022 1454   MCHC 29.9 (L) 01/02/2022 1027   RDW 17.3 (H) 06/20/2022 1454   Iron Studies No results found for: "IRON", "TIBC", "FERRITIN", "IRONPCTSAT" Lipid Panel     Component Value Date/Time   CHOL 98 (L) 06/20/2022 1454   TRIG 97 06/20/2022 1454   HDL 45 06/20/2022 1454   LDLCALC 34 06/20/2022 1454   Lab Results  Component Value Date   CHOL 98 (L) 06/20/2022   HDL 45 06/20/2022   LDLCALC 34 06/20/2022   TRIG 97  06/20/2022   Hepatic Function Panel     Component Value Date/Time   PROT 7.9 06/20/2022 1454   ALBUMIN 4.1 06/20/2022 1454   AST 29 06/20/2022 1454   ALT 26 06/20/2022 1454   ALKPHOS 102 06/20/2022 1454   BILITOT 0.3 06/20/2022 1454      Component Value Date/Time   TSH 1.500 06/20/2022 1454   Nutritional Lab Results  Component Value Date   VD25OH 29.1 (L) 06/20/2022   VD25OH 10.0 (L) 07/08/2019     Return in about 4 weeks (around 01/22/2023) for with any available provider.. She was informed of the importance of frequent follow up visits to maximize her success with intensive lifestyle modifications for her multiple health conditions.   ATTESTASTION STATEMENTS:  Reviewed by clinician on day of visit: allergies, medications, problem list, medical history, surgical history, family history, social history, and previous encounter notes.   I have spent 60 minutes in the care of the patient today including: preparing to see patient (e.g. review and interpretation of tests, old notes ), obtaining and/or reviewing separately obtained history, performing a medically appropriate examination or evaluation, counseling and  educating the patient, documenting clinical information in the electronic or other health care record, and independently interpreting results and communicating results to the patient, family, or caregiver   Worthy Rancher, MD

## 2022-12-25 NOTE — Assessment & Plan Note (Signed)
Patient has a slower than predicted metabolism. IC 1886 vs. calculated 2465. This may contribute to weight gain, chronic fatigue and difficulty losing weight.  Likely secondary to low volume of physical activity, obesogenic medications, genetics and possibly untreated sleep apnea.  We reviewed measures to improve metabolism including not skipping meals, progressive strengthening exercises, increasing protein intake at every meal and maintaining adequate hydration and sleep.

## 2022-12-25 NOTE — Assessment & Plan Note (Signed)
Patient has a high risk phenotype and also symptoms suggestive of disordered sleep breathing.  She had been referred for sleep study and was scheduled for consultation but did not follow through.  We again reminded her that if she has untreated sleep apnea I will be very difficult for her to lose and maintain her weight.  She is also on several CNS depressing drugs which may worsen her sleep apnea.

## 2023-01-01 ENCOUNTER — Other Ambulatory Visit: Payer: Self-pay | Admitting: Physical Medicine & Rehabilitation

## 2023-01-12 ENCOUNTER — Ambulatory Visit (HOSPITAL_COMMUNITY)
Admission: EM | Admit: 2023-01-12 | Discharge: 2023-01-12 | Disposition: A | Discharge status: Home / Self Care | Payer: Medicare HMO | Attending: Emergency Medicine | Admitting: Emergency Medicine

## 2023-01-12 ENCOUNTER — Encounter (HOSPITAL_COMMUNITY): Payer: Self-pay | Admitting: Emergency Medicine

## 2023-01-12 DIAGNOSIS — R051 Acute cough: Secondary | ICD-10-CM | POA: Diagnosis not present

## 2023-01-12 DIAGNOSIS — R600 Localized edema: Secondary | ICD-10-CM | POA: Diagnosis not present

## 2023-01-12 DIAGNOSIS — J040 Acute laryngitis: Secondary | ICD-10-CM | POA: Insufficient documentation

## 2023-01-12 LAB — COMPREHENSIVE METABOLIC PANEL
ALT: 20 U/L (ref 0–44)
AST: 31 U/L (ref 15–41)
Albumin: 2.9 g/dL — ABNORMAL LOW (ref 3.5–5.0)
Alkaline Phosphatase: 75 U/L (ref 38–126)
Anion gap: 9 (ref 5–15)
BUN: 11 mg/dL (ref 6–20)
CO2: 27 mmol/L (ref 22–32)
Calcium: 8.8 mg/dL — ABNORMAL LOW (ref 8.9–10.3)
Chloride: 104 mmol/L (ref 98–111)
Creatinine, Ser: 1.08 mg/dL — ABNORMAL HIGH (ref 0.44–1.00)
GFR, Estimated: >60 mL/min (ref >60)
Glucose, Bld: 88 mg/dL (ref 70–99)
Potassium: 3.6 mmol/L (ref 3.5–5.1)
Sodium: 140 mmol/L (ref 135–145)
Total Bilirubin: 0.3 mg/dL (ref 0.3–1.2)
Total Protein: 6.5 g/dL (ref 6.5–8.1)

## 2023-01-12 LAB — BRAIN NATRIURETIC PEPTIDE: B Natriuretic Peptide: 47.5 pg/mL (ref 0.0–100.0)

## 2023-01-12 LAB — CBC WITH DIFFERENTIAL/PLATELET
Abs Immature Granulocytes: 0 10*3/uL (ref 0.00–0.07)
Basophils Absolute: 0 10*3/uL (ref 0.0–0.1)
Basophils Relative: 0 %
Eosinophils Absolute: 0.1 10*3/uL (ref 0.0–0.5)
Eosinophils Relative: 2 %
HCT: 27.4 % — ABNORMAL LOW (ref 36.0–46.0)
Hemoglobin: 8.5 g/dL — ABNORMAL LOW (ref 12.0–15.0)
Lymphocytes Relative: 42 %
Lymphs Abs: 3.1 10*3/uL (ref 0.7–4.0)
MCH: 25.2 pg — ABNORMAL LOW (ref 26.0–34.0)
MCHC: 31 g/dL (ref 30.0–36.0)
MCV: 81.3 fL (ref 80.0–100.0)
Monocytes Absolute: 0.5 10*3/uL (ref 0.1–1.0)
Monocytes Relative: 7 %
Neutro Abs: 3.6 10*3/uL (ref 1.7–7.7)
Neutrophils Relative %: 49 %
Platelets: 248 10*3/uL (ref 150–400)
RBC: 3.37 MIL/uL — ABNORMAL LOW (ref 3.87–5.11)
RDW: 22.8 % — ABNORMAL HIGH (ref 11.5–15.5)
WBC: 7.4 10*3/uL (ref 4.0–10.5)
nRBC: 0 % (ref 0.0–0.2)
nRBC: 0 /100{WBCs}

## 2023-01-12 MED ORDER — FAMOTIDINE 20 MG PO TABS: 20.0000 mg | ORAL_TABLET | Freq: Two times a day (BID) | ORAL | 0 refills | Status: DC

## 2023-01-12 NOTE — ED Triage Notes (Signed)
Pt reports had dry cough and loss of voice for over a month. Was seen here and had chest xray done then. Was prescribed medications and used them   Pt adds that having bilateral ankle swelling for 3 days.

## 2023-01-12 NOTE — ED Provider Notes (Signed)
[facility]    CSN: [id number] Arrival date & time: 01/12/23  1029      History   Chief Complaint Chief Complaint  Patient presents with   Cough   Laryngitis    HPI Tracy Banks is a 42 y.o. female.   Patient presents to clinic complaining of voice loss with the past few weeks and a cough that has been dry and nonproductive for the past month.  She had been prescribed Tessalon Perles at her previous visit and had been using NSAIDs without much relief.  Over the past few days she has had bilateral ankle swelling.  This will happen occasionally, but it will improve after sleeping at night.  It is not improved.  Reports she sleeps with multiple pillows under her to keep her self propped up, if not she gets short of breath.  She does have a history of acid reflux and has been taking her daily omeprazole.  Denies any heartburn or chest pain.  No wheezing or shortness of breath.  No fevers.  No sore throat.   The history is provided by the patient and medical records.  Cough   Past Medical History:  Diagnosis Date   Acid reflux    Anemia    Anxiety    Arthritis    Asthma    Back pain    Carpal tunnel syndrome    Depression    Eczema    Fibromyalgia    Gout    History of swelling of feet    Hyperlipidemia    Hypertension    Incontinence    Lupus    Migraine    Osteoarthritis    Overactive bladder    Pneumonia    Polyarthralgia    Vitamin D deficiency     Patient Active Problem List   Diagnosis Date Noted   Weight gain due to medication 12/25/2022   Abnormal metabolism 12/25/2022   Housing situation unstable 12/25/2022   Chronic diarrhea 12/25/2022   Vitamin D deficiency 12/25/2022   Prediabetes 12/25/2022   Other fatigue 06/20/2022   SOB (shortness of breath) on exertion 06/20/2022   Depression screen 06/20/2022   Primary hypertension 06/04/2022   Suspected sleep apnea 06/04/2022   Class 3 severe obesity with serious comorbidity and body  mass index (BMI) of 50.0 to 59.9 in adult (HCC) 06/04/2022   Pure hypercholesterolemia 06/04/2022   Polypharmacy 06/04/2022   Excessive sleepiness 04/27/2021   Intractable chronic migraine without aura and with status migrainosus 05/23/2020   Blurry vision 05/23/2020   Paresthesia 06/11/2019   Chronic migraine 05/05/2018   Fibromyalgia 11/05/2016    Past Surgical History:  Procedure Laterality Date   ANKLE SURGERY Bilateral    CHONDROPLASTY Right 01/04/2022   Procedure: KNEE ARTHROSCOPY CHONDROPLASTY;  Surgeon: Eugenia Mcalpine, MD;  Location: [facility];  Service: Orthopedics;  Laterality: Right;  knee block  60   KNEE ARTHROSCOPY WITH LATERAL MENISECTOMY Right 01/04/2022   Procedure: KNEE ARTHROSCOPY WITH PARTIAL LATERAL MENISECTOMY;  Surgeon: Eugenia Mcalpine, MD;  Location: [facility];  Service: Orthopedics;  Laterality: Right;  knee block 60   LEG SURGERY Right    Muscle stretched   NEUROMA SURGERY      OB History     Gravida  4   Para  4   Term      Preterm      AB      Living         SAB  IAB      Ectopic      Multiple      Live Births               Home Medications    Prior to Admission medications   Medication Sig Start Date End Date Taking? Authorizing Provider  famotidine (PEPCID) 20 MG tablet Take 1 tablet (20 mg total) by mouth 2 (two) times daily. 01/12/23  Yes Rinaldo Ratel, [place] N, FNP  XTAMPZA ER 13.5 MG C12A Take 1 capsule by mouth 2 (two) times daily. 01/11/23  Yes [provider]  benzonatate (TESSALON) 100 MG capsule Take 1 capsule (100 mg total) by mouth every 8 (eight) hours. 12/15/22   Wynonia Lawman A, NP  buPROPion (WELLBUTRIN SR) 150 MG 12 hr tablet Take 1 tablet by mouth daily.    [provider]  buPROPion (WELLBUTRIN XL) 300 MG 24 hr tablet Take 300 mg by mouth daily. 01/03/22   [provider]  celecoxib (CELEBREX) 100 MG capsule Take 100 mg by mouth 2 (two) times daily.    [provider]   Cholecalciferol (VITAMIN D3) 50 MCG (2000 UT) capsule Take 1 capsule (2,000 Units total) by mouth daily. 12/25/22   Worthy Rancher, MD  doxycycline (VIBRAMYCIN) 100 MG capsule Take 1 capsule (100 mg total) by mouth 2 (two) times daily. 12/15/22   Wynonia Lawman A, NP  DULoxetine (CYMBALTA) 30 MG capsule Take 1 capsule (30 mg total) by mouth daily. 03/26/22 03/26/23  Fanny Dance, MD  DULoxetine (CYMBALTA) 60 MG capsule TAKE 1 CAPSULE(60 MG) BY MOUTH DAILY 11/03/22   Fanny Dance, MD  EMGALITY 120 MG/ML SOAJ Inject 140 mg into the skin every 30 (thirty) days. 11/12/22   Glean Salvo, NP  HYDROcodone-acetaminophen (NORCO/VICODIN) 5-325 MG tablet Take 1-2 tablets by mouth every 6 (six) hours as needed. 04/04/22   [provider]  hydroxychloroquine (PLAQUENIL) 200 MG tablet Take 200 mg by mouth 2 (two) times daily.    [provider]  ibuprofen (ADVIL) 800 MG tablet Take 800 mg by mouth every 8 (eight) hours as needed. 12/12/22   [provider]  nortriptyline (PAMELOR) 50 MG capsule TAKE 2 CAPSULES(100 MG) BY MOUTH AT BEDTIME 11/12/22   Glean Salvo, NP  nystatin (MYCOSTATIN) 100000 UNIT/ML suspension     [provider]  omeprazole (PRILOSEC) 40 MG capsule Take 40 mg by mouth daily.    [provider]  pregabalin (LYRICA) 150 MG capsule Take 1 capsule (150 mg total) by mouth in the morning, at noon, and at bedtime. 04/04/22   Levert Feinstein, MD  propranolol (INDERAL) 40 MG tablet TAKE 1 TABLET(40 MG) BY MOUTH TWICE DAILY 12/03/22   Levert Feinstein, MD  rosuvastatin (CRESTOR) 10 MG tablet Take 10 mg by mouth daily. 11/15/21   [provider]  tolterodine (DETROL LA) 4 MG 24 hr capsule Take 4 mg by mouth daily. 02/05/21   [provider]  topiramate (TOPAMAX) 100 MG tablet One in am/ 2 at night 04/27/21   Levert Feinstein, MD  torsemide (DEMADEX) 20 MG tablet Take 20 mg by mouth daily.    [provider]  traMADol (ULTRAM) 50 MG tablet Take  50 mg by mouth daily.    [provider]  ziprasidone (GEODON) 20 MG capsule Take 20 mg by mouth See admin instructions. Take with 80 mg for a total of 100 mg daily 04/03/21   [provider]  ziprasidone (GEODON) 80 MG capsule Take 80  mg by mouth See admin instructions. Take with 20 mg for a total of 100 mg daily 12/04/21   [provider]  zolpidem (AMBIEN) 5 MG tablet Take 1 tablet by mouth at bedtime as needed.    [provider]    Family History Family History  Problem Relation Age of Onset   Sarcoidosis Mother    Migraines Mother    Hypertension Mother    Osteoarthritis Mother    Diabetes Mother    Fibromyalgia Mother    Depression Mother    Sleep apnea Mother    Obesity Mother    Healthy Father     Social History Social History   Tobacco Use   Smoking status: Every Day    Current packs/day: 0.25    Average packs/day: 0.3 packs/day for 2.0 years (0.5 ttl pk-yrs)    Types: Cigarettes   Smokeless tobacco: Never   Tobacco comments:    1 pack per week  Vaping Use   Vaping status: Never Used  Substance Use Topics   Alcohol use: Yes    Comment: occ   Drug use: No     Allergies   Penicillins   Review of Systems Review of Systems  Per HPI.   Physical Exam Triage Vital Signs ED Triage Vitals  Encounter Vitals Group     BP 01/12/23 1227 107/75     Systolic BP Percentile --      Diastolic BP Percentile --      Pulse Rate 01/12/23 1227 87     Resp 01/12/23 1227 18     Temp 01/12/23 1227 (!) 97.4 F (36.3 C)     Temp Source 01/12/23 1227 Oral     SpO2 01/12/23 1227 98 %     Weight --      Height --      Head Circumference --      Peak Flow --      Pain Score 01/12/23 1223 0     Pain Loc --      Pain Education --      Exclude from Growth Chart --    No data found.  Updated Vital Signs BP 107/75 (BP Location: Left Arm)   Pulse 87   Temp (!) 97.4 F (36.3 C) (Oral)   Resp 18   LMP 12/13/2022 (Exact Date)   SpO2  98%   Visual Acuity Right Eye Distance:   Left Eye Distance:   Bilateral Distance:    Right Eye Near:   Left Eye Near:    Bilateral Near:     Physical Exam Vitals and nursing note reviewed.  Constitutional:      Appearance: She is obese.  HENT:     Head: Normocephalic and atraumatic.     Right Ear: External ear normal.     Left Ear: External ear normal.     Nose: Nose normal.     Mouth/Throat:     Mouth: Mucous membranes are moist.  Eyes:     Conjunctiva/sclera: Conjunctivae normal.  Cardiovascular:     Rate and Rhythm: Normal rate and regular rhythm.     Heart sounds: Normal heart sounds. No murmur heard. Pulmonary:     Effort: Pulmonary effort is normal. No respiratory distress.     Breath sounds: Normal breath sounds.  Musculoskeletal:        General: Tenderness present. No swelling, deformity or signs of injury. Normal range of motion.     Right lower leg: 3+ Edema present.  Left lower leg: 3+ Edema present.  Skin:    General: Skin is warm and dry.     Capillary Refill: Capillary refill takes less than 2 seconds.     Findings: No erythema or rash.  Neurological:     General: No focal deficit present.     Mental Status: She is alert and oriented to person, place, and time.  Psychiatric:        Mood and Affect: Mood normal.        Behavior: Behavior normal. Behavior is cooperative.      UC Treatments / Results  Labs (all labs ordered are listed, but only abnormal results are displayed) Labs Reviewed  CBC WITH DIFFERENTIAL/PLATELET  COMPREHENSIVE METABOLIC PANEL  BRAIN NATRIURETIC PEPTIDE    EKG   Radiology No results found.  Procedures Procedures (including critical care time)  Medications Ordered in UC Medications - No data to display  Initial Impression / Assessment and Plan / UC Course  I have reviewed the triage vital signs and the nursing notes.  Pertinent labs & imaging results that were available during my care of the patient were  reviewed by me and considered in my medical decision making (see chart for details).  Vitals and triage reviewed, patient is hemodynamically stable.  Patient is morbidly obese.  Lungs are vesicular, heart with regular rate and rhythm.  Does have 3+ nonpitting edema to bilateral lower extremities, and a nonproductive cough for the past month.  Has been sleeping with pillows tipiracil propped up.  Suspicious for heart failure, will check basic labs and BNP.  Symptomatic management for peripheral edema discussed.  Will trial H2 blocker to see if laryngitis is due to acid reflux.  Strict emergency precautions given if symptoms worsen.  Plan of care, follow-up care discussed, no questions at this time.  Work note provided.    Final Clinical Impressions(s) / UC Diagnoses   Final diagnoses:  Acute cough  Peripheral edema  Laryngitis     Discharge Instructions      Please try the Pepcid twice daily to see if this helps with your laryngitis.  Stay upright after meals and do not eat spicy or fried foods, as this may worsen acid reflux.  For your lower extremity swelling please keep her legs propped up and wear compression stockings to help aid in the swelling.  You can take Tylenol as needed for pain.  We have checked some labs and we will contact you if anything results is emergent.  If your symptoms persist please follow-up with your primary care provider within the next week.  Seek immediate care at the nearest emergency department if your symptoms worsen for further evaluation.      ED Prescriptions     Medication Sig Dispense Auth. Provider   famotidine (PEPCID) 20 MG tablet Take 1 tablet (20 mg total) by mouth 2 (two) times daily. 30 tablet Nicholos Aloisi, [place] N, [place]      PDMP not reviewed this encounter.   Raymie Trani, [place] N, [place] 01/12/23 [phone]

## 2023-01-12 NOTE — Discharge Instructions (Signed)
Please try the Pepcid twice daily to see if this helps with your laryngitis.  Stay upright after meals and do not eat spicy or fried foods, as this may worsen acid reflux.  For your lower extremity swelling please keep her legs propped up and wear compression stockings to help aid in the swelling.  You can take Tylenol as needed for pain.  We have checked some labs and we will contact you if anything results is emergent.  If your symptoms persist please follow-up with your primary care provider within the next week.  Seek immediate care at the nearest emergency department if your symptoms worsen for further evaluation.

## 2023-01-13 ENCOUNTER — Telehealth: Payer: Self-pay

## 2023-01-13 NOTE — Telephone Encounter (Signed)
Per Helaine Chess,  PA-C, "Her kidney function is stable. She is suffering from significant iron deficiency anemia. Please advise her to begin taking an over-the-counter iron supplement daily along with 500 mg of vitamin C and to follow-up with her PCP to have her CBC repeated in about a month."  TC to pt. Advised of results and PA recs. Verbalized understanding.

## 2023-01-17 ENCOUNTER — Encounter: Payer: Self-pay | Admitting: Neurology

## 2023-01-17 ENCOUNTER — Ambulatory Visit: Payer: Medicare HMO | Admitting: Neurology

## 2023-01-21 ENCOUNTER — Encounter (INDEPENDENT_AMBULATORY_CARE_PROVIDER_SITE_OTHER): Payer: Self-pay | Admitting: Internal Medicine

## 2023-01-21 ENCOUNTER — Ambulatory Visit (INDEPENDENT_AMBULATORY_CARE_PROVIDER_SITE_OTHER): Payer: Medicare HMO | Admitting: Internal Medicine

## 2023-01-21 VITALS — BP 120/82 | HR 61 | Temp 98.0°F | Ht 69.0 in | Wt 359.0 lb

## 2023-01-21 DIAGNOSIS — R7303 Prediabetes: Secondary | ICD-10-CM | POA: Diagnosis not present

## 2023-01-21 DIAGNOSIS — I10 Essential (primary) hypertension: Secondary | ICD-10-CM | POA: Diagnosis not present

## 2023-01-21 DIAGNOSIS — E66813 Obesity, class 3: Secondary | ICD-10-CM | POA: Diagnosis not present

## 2023-01-21 DIAGNOSIS — T50905A Adverse effect of unspecified drugs, medicaments and biological substances, initial encounter: Secondary | ICD-10-CM

## 2023-01-21 DIAGNOSIS — Z6841 Body Mass Index (BMI) 40.0 and over, adult: Secondary | ICD-10-CM

## 2023-01-21 DIAGNOSIS — Z59819 Housing instability, housed unspecified: Secondary | ICD-10-CM

## 2023-01-21 NOTE — Assessment & Plan Note (Signed)
See obesity treatment plan.  Patient has been making good progress implementing nutritional strategies to lose weight also reducing processed foods.  She is on several medications that may contribute to weight gain which will slow down her weight loss progress.  She will be working with prescribing physicians on finding weight neutral options to these medications.

## 2023-01-21 NOTE — Assessment & Plan Note (Signed)
Affecting medical care.  She is working with a Child psychotherapist to obtain stable housing.  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  She recently bought a range and some pots.  She was getting sick from eating takeout.

## 2023-01-21 NOTE — Progress Notes (Signed)
Office: [phone]  /  Fax: [phone]  WEIGHT SUMMARY AND BIOMETRICS  Vitals Temp: 98 F (36.7 C) BP: 120/82 Pulse Rate: 61 SpO2: 97 %   Anthropometric Measurements Height: 5\' 9"  (1.753 m) Weight: (!) 359 lb (162.8 kg) BMI (Calculated): 52.99 Weight at Last Visit: 364 lb Weight Lost Since Last Visit: 5 lb Starting Weight: 379 lb Total Weight Loss (lbs): 20 lb (9.072 kg)   Body Composition  Body Fat %: 58.6 % Fat Mass (lbs): 210.8 lbs Muscle Mass (lbs): 141.6 lbs Visceral Fat Rating : 22    RMR: [id number]  Today's Visit #: 3  Starting Date: 06/20/22   HPI  Chief Complaint: OBESITY  Tracy Banks is here to discuss her progress with her obesity treatment plan. She is on the the Category 3 Plan and states she is following her eating plan approximately 0 % of the time. She states she walks all day long.  Interval History:   Discussed the use of AI scribe software for clinical note transcription with the patient, who gave verbal consent to proceed.  History of Present Illness   The patient, currently residing in a hotel, presents for weight management. She reports a recent weight loss of five pounds, which was unexpected as she believed she was gaining weight. She attributes this weight loss to dietary changes, including cooking her own meals instead of consuming processed or takeout foods. Her meals typically consist of rice, salad, and chicken, with an emphasis on increasing vegetable intake. She also mentions incorporating more fruits into her diet, often consuming them after dinner.  Despite these positive changes, the patient admits to consuming high-sugar drinks, believing them to be healthier due to their fruit content. She expresses surprise upon learning about the high sugar content of these beverages and the benefits of consuming whole fruits instead of juiced ones.  The patient's living situation and work schedule pose challenges to her dietary habits. She works  night shifts, returning home late, which leads to a late breakfast or brunch. Despite these challenges, she has made efforts to improve her diet, including setting up a makeshift kitchen in her hotel room.    Orexigenic Control: Denies problems with appetite and hunger signals.  Denies problems with satiety and satiation.  Denies problems with eating patterns and portion control.  Denies abnormal cravings. Denies feeling deprived or restricted.   Barriers identified: multiple competing priorities, cost of healthy foods, having difficulty preparing healthy meals, having difficulty with meal prep and planning, need for convenience or prepackaged foods, limited food variation or intolerances, low volume of physical acitivity, medical comorbidities, difficulty implementing reduced calorie nutrition plan, presence of obesogenic drugs, inadequate sleep, slow metabolism for age, sleep apnea, and unstable housing without having ability to prepare meals .   Pharmacotherapy for weight loss: She is currently taking no anti-obesity medication.    ASSESSMENT AND PLAN  TREATMENT PLAN FOR OBESITY:  Recommended Dietary Goals  [name] is currently in the action stage of change. As such, her goal is to continue weight management plan. She has agreed to: continue current plan  Behavioral Intervention  We discussed the following Behavioral Modification Strategies today: continue to work on maintaining a reduced calorie state, getting the recommended amount of protein, incorporating whole foods, making healthy choices, staying well hydrated and practicing mindfulness when eating..  Additional resources provided today: None  Recommended Physical Activity Goals  [name] has been advised to work up to 150 minutes of moderate intensity aerobic activity a week and  strengthening exercises 2-3 times per week for cardiovascular health, weight loss maintenance and preservation of muscle mass.   She has agreed to :   Think about enjoyable ways to increase daily physical activity and overcoming barriers to exercise and Increase physical activity in their day and reduce sedentary time (increase NEAT).  Pharmacotherapy We discussed various medication options to help [facility] with her weight loss efforts and we both agreed to : continue with nutritional and behavioral strategies  ASSOCIATED CONDITIONS ADDRESSED TODAY  Class 3 severe obesity with serious comorbidity and body mass index (BMI) of 50.0 to 59.9 in adult, unspecified obesity type [facility]) Assessment & Plan: See obesity treatment plan.  Patient has been making good progress implementing nutritional strategies to lose weight also reducing processed foods.  She is on several medications that may contribute to weight gain which will slow down her weight loss progress.  She will be working with prescribing physicians on finding weight neutral options to these medications.   Primary hypertension Assessment & Plan: Improved.  On propranolol which may contribute to weight gain.  I recommend considering a weight neutral beta-blocker like carvedilol or Bystolic.  Losing 10% of body weight may improve condition.   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  Patient aware of disease state and risk of progression. This may contribute to abnormal cravings, fatigue and diabetic complications without having diabetes.   We have discussed treatment options which include: losing 7 to 10% of body weight, increasing physical activity to a goal of 150 minutes a week at moderate intensity.  Advised to maintain a diet low on simple and processed carbohydrates.  She may also be a candidate for pharmacoprophylaxis with metformin or incretin mimetic.     Housing situation unstable Assessment & Plan: Affecting  medical care.  She is working with a Child psychotherapist to obtain stable housing.  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  She recently bought a range and some pots.  She was getting sick from eating takeout.   Weight gain due to medication Assessment & Plan: Patient on multiple obesogenic medications.  We discussed how these medications may cause weight gain and affect her weight loss.  She may benefit from metformin to offset some of the weight gain associated with psychotropic medication we will hold off pharmacotherapy until she is following up as recommended.  We will consider starting metformin at the next office visit     PHYSICAL EXAM:  Blood pressure 120/82, pulse 61, temperature 98 F (36.7 C), height 5\' 9"  (1.753 m), weight (!) 359 lb (162.8 kg), SpO2 97%. Body mass index is 53.02 kg/m.  General: She is overweight, cooperative, alert, well developed, and in no acute distress. PSYCH: Has normal mood, affect and thought process.   HEENT: EOMI, sclerae are anicteric. Lungs: Normal breathing effort, no conversational dyspnea. Extremities: No edema.  Neurologic: No gross sensory or motor deficits. No tremors or fasciculations noted.    DIAGNOSTIC DATA REVIEWED:  BMET    Component Value Date/Time   NA 140 01/12/2023 1301   NA 144 06/20/2022 1454   K 3.6 01/12/2023 1301   CL 104 01/12/2023 1301   CO2 27 01/12/2023 1301   GLUCOSE 88 01/12/2023 1301   BUN 11 01/12/2023 1301   BUN 10 06/20/2022 1454  CREATININE 1.08 (H) 01/12/2023 1301   CALCIUM 8.8 (L) 01/12/2023 1301   GFRNONAA >60 01/12/2023 1301   GFRAA 76 06/11/2019 1024   Lab Results  Component Value Date   HGBA1C 5.7 (H) 06/20/2022   HGBA1C 5.3 06/11/2019   Lab Results  Component Value Date   INSULIN 46.4 (H) 06/20/2022   Lab Results  Component Value Date   TSH 1.500 06/20/2022   CBC    Component Value Date/Time   WBC 7.4 01/12/2023 1301   RBC 3.37 (L) 01/12/2023 1301   HGB 8.5 (L)  01/12/2023 1301   HGB 11.4 06/20/2022 1454   HCT 27.4 (L) 01/12/2023 1301   HCT 37.4 06/20/2022 1454   PLT 248 01/12/2023 1301   PLT 279 06/20/2022 1454   MCV 81.3 01/12/2023 1301   MCV 74 (L) 06/20/2022 1454   MCH 25.2 (L) 01/12/2023 1301   MCHC 31.0 01/12/2023 1301   RDW 22.8 (H) 01/12/2023 1301   RDW 17.3 (H) 06/20/2022 1454   Iron Studies No results found for: "IRON", "TIBC", "FERRITIN", "IRONPCTSAT" Lipid Panel     Component Value Date/Time   CHOL 98 (L) 06/20/2022 1454   TRIG 97 06/20/2022 1454   HDL 45 06/20/2022 1454   LDLCALC 34 06/20/2022 1454   Hepatic Function Panel     Component Value Date/Time   PROT 6.5 01/12/2023 1301   PROT 7.9 06/20/2022 1454   ALBUMIN 2.9 (L) 01/12/2023 1301   ALBUMIN 4.1 06/20/2022 1454   AST 31 01/12/2023 1301   ALT 20 01/12/2023 1301   ALKPHOS 75 01/12/2023 1301   BILITOT 0.3 01/12/2023 1301   BILITOT 0.3 06/20/2022 1454      Component Value Date/Time   TSH 1.500 06/20/2022 1454   Nutritional Lab Results  Component Value Date   VD25OH 29.1 (L) 06/20/2022   VD25OH 10.0 (L) 07/08/2019     Return in about 3 weeks (around 02/11/2023) for For Weight Mangement with Dr. Rikki Spearing.Marland Kitchen She was informed of the importance of frequent follow up visits to maximize her success with intensive lifestyle modifications for her multiple health conditions.   ATTESTASTION STATEMENTS:  Reviewed by clinician on day of visit: allergies, medications, problem list, medical history, surgical history, family history, social history, and previous encounter notes.     Worthy Rancher, MD

## 2023-01-21 NOTE — Assessment & Plan Note (Signed)
Improved.  On propranolol which may contribute to weight gain.  I recommend considering a weight neutral beta-blocker like carvedilol or Bystolic.  Losing 10% of body weight may improve condition.

## 2023-01-21 NOTE — Assessment & Plan Note (Signed)
Patient on multiple obesogenic medications.  We discussed how these medications may cause weight gain and affect her weight loss.  She may benefit from metformin to offset some of the weight gain associated with psychotropic medication we will hold off pharmacotherapy until she is following up as recommended.  We will consider starting metformin at the next office visit

## 2023-01-21 NOTE — Assessment & Plan Note (Signed)
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  Patient aware of disease state and risk of progression. This may contribute to abnormal cravings, fatigue and diabetic complications without having diabetes.   We have discussed treatment options which include: losing 7 to 10% of body weight, increasing physical activity to a goal of 150 minutes a week at moderate intensity.  Advised to maintain a diet low on simple and processed carbohydrates.  She may also be a candidate for pharmacoprophylaxis with metformin or incretin mimetic.

## 2023-02-10 NOTE — Progress Notes (Unsigned)
[facility]   [phone] [phone]   Clinic New consult Note   Patient Care Team: Pcp, No as PCP - General Merwyn Katos, DPM as Consulting Physician (Podiatry) 02/11/2023  CHIEF COMPLAINTS/PURPOSE OF CONSULTATION:  Anemia, referred by Stefani Dama, PA  HISTORY OF PRESENTING ILLNESS:  Tracy Banks 42 y.o. female with PMH including GERD, anxiety, arthritis, asthma, depression, eczema, fibromyalgia, gout, HL, HTN, pre-DM, lupus, migraines, and vitamin D efficiency is here because of anemia.  She had a normal CBC on 06/11/2019 and prior.  She developed a mild anemia with hemoglobin 11.4-11.7 from 10/23 - 06/20/22.  Additional labs at that time showed hemoglobin A1c 5.7, TSH 1.5 and B12 level 803.  PCP labs 01/04/2023 showed Hgb 9.4, MCV 79, slightly low folate 5.3, normal TSH, iron 23, TIBC 464, and ferritin 9.4.  She began high-dose oral iron once weekly, completed 3 weeks.  Tolerating well with stable baseline constipation.  She presented to ED 01/12/2023 with cough and laryngitis, CBC showed hemoglobin down to 8.5, HCT 27.4, normal MCV, normal platelet and WBC.  She reports being on Depo birth control for 13 years, stopped 2 years ago and periods have been very heavy since then, occurring monthly lasting 4 days but heavy on days 1 through 3 where she will bleed through her close.  Denies other sources of bleeding, recent infection.  She does have lupus which is controlled.  Denies GI/bariatric surgery.  Not on anticoagulation. She sent off a stool test recently that has not resulted yet.  Socially she is single, G4 P3 with healthy children.  She works as a Conservation officer, nature.  Independent with ADLs.  She drinks alcohol occasionally, smokes half a pack a day of cigarettes for 2.5 years, denies other drug use.  She notes that life is very stressful and her mother is sick.  She is up-to-date on age-appropriate cancer screenings.  Denies significant family history.  Today  she presents by herself, feeling fatigued but at baseline for years.  She did not have a period in October.  She is craving ice currently.  Denies palpitations, lightheadedness, dizziness.   MEDICAL HISTORY:  Past Medical History:  Diagnosis Date   Acid reflux    Anemia    Anxiety    Arthritis    Asthma    Back pain    Carpal tunnel syndrome    Depression    Eczema    Fibromyalgia    Gout    History of swelling of feet    Hyperlipidemia    Hypertension    Incontinence    Lupus    Migraine    Osteoarthritis    Overactive bladder    Pneumonia    Polyarthralgia    Vitamin D deficiency     SURGICAL HISTORY: Past Surgical History:  Procedure Laterality Date   ANKLE SURGERY Bilateral    CHONDROPLASTY Right 01/04/2022   Procedure: KNEE ARTHROSCOPY CHONDROPLASTY;  Surgeon: Eugenia Mcalpine, MD;  Location: [facility];  Service: Orthopedics;  Laterality: Right;  knee block  60   KNEE ARTHROSCOPY WITH LATERAL MENISECTOMY Right 01/04/2022   Procedure: KNEE ARTHROSCOPY WITH PARTIAL LATERAL MENISECTOMY;  Surgeon: Eugenia Mcalpine, MD;  Location: [facility];  Service: Orthopedics;  Laterality: Right;  knee block 60   LEG SURGERY Right    Muscle stretched   NEUROMA SURGERY      SOCIAL HISTORY: Social History   Socioeconomic History   Marital status: Single    Spouse name: stay at home  Number of children: 3   Years of education: college   Highest education level: Not on file  Occupational History   Not on file  Tobacco Use   Smoking status: Every Day    Current packs/day: 0.25    Average packs/day: 0.3 packs/day for 2.0 years (0.5 ttl pk-yrs)    Types: Cigarettes   Smokeless tobacco: Never   Tobacco comments:    1 pack per week  Vaping Use   Vaping status: Never Used  Substance and Sexual Activity   Alcohol use: Yes    Comment: occ   Drug use: No   Sexual activity: Not on file  Other Topics Concern   Not on file  Social History Narrative   Lives at home with children.    Right-handed.   Caffeine use:  One can soda per day.   Social Determinants of Health   Financial Resource Strain: Not on file  Food Insecurity: Not on file  Transportation Needs: Not on file  Physical Activity: Not on file  Stress: Not on file  Social Connections: Unknown (07/24/2021)   Received from [facility], [facility]   Social Network    Social Network: Not on file  Intimate Partner Violence: Unknown (06/22/2021)   Received from [facility], [facility]   HITS    Physically Hurt: Not on file    Insult or Talk Down To: Not on file    Threaten Physical Harm: Not on file    Scream or Curse: Not on file    FAMILY HISTORY: Family History  Problem Relation Age of Onset   Sarcoidosis Mother    Migraines Mother    Hypertension Mother    Osteoarthritis Mother    Diabetes Mother    Fibromyalgia Mother    Depression Mother    Sleep apnea Mother    Obesity Mother    Healthy Father     ALLERGIES:  is allergic to penicillins.  MEDICATIONS:  Current Outpatient Medications  Medication Sig Dispense Refill   Cholecalciferol (VITAMIN D3) 50 MCG (2000 UT) capsule Take 1 capsule (2,000 Units total) by mouth daily.     DULoxetine (CYMBALTA) 30 MG capsule Take 1 capsule (30 mg total) by mouth daily. 30 capsule 3   DULoxetine (CYMBALTA) 60 MG capsule TAKE 1 CAPSULE(60 MG) BY MOUTH DAILY 30 capsule 0   EMGALITY 120 MG/ML SOAJ Inject 140 mg into the skin every 30 (thirty) days. 1.12 mL 11   famotidine (PEPCID) 20 MG tablet Take 1 tablet (20 mg total) by mouth 2 (two) times daily. 30 tablet 0   folic acid (FOLVITE) 1 MG tablet Take 1 tablet (1 mg total) by mouth daily. 30 tablet 3   HYDROcodone-acetaminophen (NORCO/VICODIN) 5-325 MG tablet Take 1-2 tablets by mouth every 6 (six) hours as needed.     hydroxychloroquine (PLAQUENIL) 200 MG tablet Take 200 mg by mouth 2 (two) times daily.     ibuprofen (ADVIL) 800 MG tablet Take 800 mg by mouth every 8 (eight) hours as needed.      nortriptyline (PAMELOR) 50 MG capsule TAKE 2 CAPSULES(100 MG) BY MOUTH AT BEDTIME 60 capsule 5   nystatin (MYCOSTATIN) 100000 UNIT/ML suspension      omeprazole (PRILOSEC) 40 MG capsule Take 40 mg by mouth daily.     pregabalin (LYRICA) 150 MG capsule Take 1 capsule (150 mg total) by mouth in the morning, at noon, and at bedtime. 90 capsule 5   propranolol (INDERAL) 40 MG tablet TAKE 1  TABLET(40 MG) BY MOUTH TWICE DAILY 180 tablet 3   rosuvastatin (CRESTOR) 10 MG tablet Take 10 mg by mouth daily.     tolterodine (DETROL LA) 4 MG 24 hr capsule Take 4 mg by mouth daily.     topiramate (TOPAMAX) 100 MG tablet One in am/ 2 at night 270 tablet 4   torsemide (DEMADEX) 20 MG tablet Take 20 mg by mouth daily.     traMADol (ULTRAM) 50 MG tablet Take 50 mg by mouth daily.     XTAMPZA ER 13.5 MG C12A Take 1 capsule by mouth 2 (two) times daily.     ziprasidone (GEODON) 20 MG capsule Take 20 mg by mouth See admin instructions. Take with 80 mg for a total of 100 mg daily     ziprasidone (GEODON) 80 MG capsule Take 80 mg by mouth See admin instructions. Take with 20 mg for a total of 100 mg daily     zolpidem (AMBIEN) 5 MG tablet Take 1 tablet by mouth at bedtime as needed.     benzonatate (TESSALON) 100 MG capsule Take 1 capsule (100 mg total) by mouth every 8 (eight) hours. (Patient not taking: Reported on 02/11/2023) 21 capsule 0   buPROPion (WELLBUTRIN SR) 150 MG 12 hr tablet Take 1 tablet by mouth daily. (Patient not taking: Reported on 02/11/2023)     buPROPion (WELLBUTRIN XL) 300 MG 24 hr tablet Take 300 mg by mouth daily. (Patient not taking: Reported on 02/11/2023)     celecoxib (CELEBREX) 100 MG capsule Take 100 mg by mouth 2 (two) times daily. (Patient not taking: Reported on 01/21/2023)     doxycycline (VIBRAMYCIN) 100 MG capsule Take 1 capsule (100 mg total) by mouth 2 (two) times daily. (Patient not taking: Reported on 02/11/2023) 20 capsule 0   No current facility-administered medications for  this visit.    REVIEW OF SYSTEMS:   Constitutional: Denies fevers, chills or abnormal night sweats (+) fatigue  Eyes: Denies blurriness of vision, double vision or watery eyes Ears, nose, mouth, throat, and face: Denies mucositis or sore throat (+) hoarseness  Respiratory: Denies cough, dyspnea or wheezes Cardiovascular: Denies palpitation, chest discomfort or lower extremity swelling Gastrointestinal:  Denies nausea, heartburn or change in bowel habits (+) GERD Skin: Denies abnormal skin rashes (+)Lupus Lymphatics: Denies new lymphadenopathy or easy bruising Neurological:Denies numbness, tingling or new weaknesses (+) fibromyalgia Behavioral/Psych: Mood is stable, no new changes (+) depression All other systems were reviewed with the patient and are negative.  PHYSICAL EXAMINATION: ECOG PERFORMANCE STATUS: 1 - Symptomatic but completely ambulatory  Vitals:   02/11/23 1100  BP: 134/78  Pulse: 100  Resp: 18  Temp: 98.2 F (36.8 C)  SpO2: 100%   Filed Weights   02/11/23 1100  Weight: (!) 366 lb 9.6 oz (166.3 kg)    GENERAL:alert, no distress and comfortable SKIN:  no rashes or significant lesions EYES:sclera clear NECK: without mass LYMPH:  no palpable cervical or supraclavicular lymphadenopathy  LUNGS:  normal breathing effort HEART: no lower extremity edema ABDOMEN:abdomen soft, non-tender  Musculoskeletal:no cyanosis of digits and no clubbing  PSYCH: alert & oriented x 3 with fluent speech NEURO: no focal motor/sensory deficits  LABORATORY DATA:  I have reviewed the data as listed    Latest Ref Rng & Units 02/11/2023   12:47 PM 01/12/2023    1:01 PM 06/20/2022    2:54 PM  CBC  WBC 4.0 - 10.5 K/uL 7.8  7.4  7.0   Hemoglobin 12.0 -  15.0 g/dL 8.6  8.5  40.9   Hematocrit 36.0 - 46.0 % 29.3  27.4  37.4   Platelets 150 - 400 K/uL 211  248  279        Latest Ref Rng & Units 01/12/2023    1:01 PM 06/20/2022    2:54 PM 01/02/2022   10:27 AM  CMP  Glucose 70 - 99  mg/dL 88  93  96   BUN 6 - 20 mg/dL 11  10  13    Creatinine 0.44 - 1.00 mg/dL 8.11  9.14  7.82   Sodium 135 - 145 mmol/L 140  144  143   Potassium 3.5 - 5.1 mmol/L 3.6  4.1  4.3   Chloride 98 - 111 mmol/L 104  105  112   CO2 22 - 32 mmol/L 27  20  25    Calcium 8.9 - 10.3 mg/dL 8.8  9.7  8.4   Total Protein 6.5 - 8.1 g/dL 6.5  7.9    Total Bilirubin 0.3 - 1.2 mg/dL 0.3  0.3    Alkaline Phos 38 - 126 U/L 75  102    AST 15 - 41 U/L 31  29    ALT 0 - 44 U/L 20  26       RADIOGRAPHIC STUDIES: I have personally reviewed the radiological images as listed and agreed with the findings in the report. No results found.  ASSESSMENT & PLAN: 42 yo premenopausal female with   Anemia, likely IDA 2/2 gyn blood loss -We reviewed her medical record in detail with the patient -She had normal CBC in 2021 and prior, developed mild anemia Hgb 11 range in 2023, and presents with symptomatic acute anemia hgb 8.5 with fatigue and ice pica -Further work up shows ferritin 9 and elevated TIBC c/w IDA (01/04/23) -She reports 2 yr h/o heavy menstrual periods lasting 3 days each month, except October, since stopping depo contraceptive 2 years ago. This is likely the source of her blood loss IDA -She recently mailed back a stool test, if positive she understands EGD/colonoscopy will be recommended to r/o GI bleeding source -She has multiple medical conditions and autoimmune condition which are controlled per patient.  She may have a component of anemia of chronic disease -Other work up shows slightly low folate, I recommend to start folic acid and a prescription was sent. Normal TSH and B12 -She has been on oral iron ("high dose" once/week) x3 weeks per PCP. Tolerating well.  -Repeat labs today show persistent IDA, hgb 8.6, microcytosis, low reticulocyte hemoglobin and elevated immature retic %. Iron panel shows she has not responded to oral iron thus far. Ferritin is pending -I recommend IV iron, the potential SEs,  benefit, and risk including anaphylactic reaction. She agrees to proceed -Monoferric is not preferred, will proceed with venofer 500 mg x2 -Will check lab 4-6 weeks after IV iron, then periodically after that and arrange more IV iron as needed -She understands if anemia does not completely resolve with sufficient iron replacement, we will recommend additional workup -F/up in 3 months -Pt seen with Dr. Mosetta Putt   PLAN: -Medical record reviewed -Lab today -Start folic acid 1 tab daily and continue oral iron with vit C source, separate from PPI -Proceed with IV Venofer (per insurance) 500 mg x2. Pt consented to IV iron -Lab in 4-6 weeks to see how she responded to IV iron, then monthly  -F/up in 3 months  -Pt seen with Dr. Mosetta Putt   Orders Placed  This Encounter  Procedures   CBC with Differential (Cancer Center Only)    Standing Status:   Future    Number of Occurrences:   1    Standing Expiration Date:   02/11/2024   Ferritin    Standing Status:   Future    Number of Occurrences:   1    Standing Expiration Date:   02/11/2024   Iron and Iron Binding Capacity ([facility] only)    Standing Status:   Future    Number of Occurrences:   1    Standing Expiration Date:   02/11/2024   Retic Panel    Standing Status:   Future    Number of Occurrences:   1    Standing Expiration Date:   02/11/2024     All questions were answered. The patient knows to call the clinic with any problems, questions or concerns.      Pollyann Samples, NP 02/11/23   Addendum I have seen the patient, examined her. I agree with the assessment and and plan and have edited the notes.   42 year old female who was referred for iron deficient anemia.  I have reviewed her outside lab, she has developed moderate anemia in the past 6 months, ferritin was low, and that she received blood transfusion and IV iron.  Will repeat her CBC and iron studies today, will also check reticulocyte count.  Her outside B12 level was  normal, folic acid was slightly low, we suggested her to start folic acid daily.  Will monitor her CBC and iron level, if her anemia does not resolve after adequate iron replacement, will proceed with additional anemia workup.  Her iron deficient anemia is likely related to her menorrhagia.  Her previous stool test was negative for OB, no clinical concern for other bleeding.  I spent a total of 30 minutes for her visit today, more than 50% time on face-to-face counseling.  Malachy Mood  02/11/2023

## 2023-02-11 ENCOUNTER — Encounter: Payer: Self-pay | Admitting: Pediatrics

## 2023-02-11 ENCOUNTER — Encounter: Payer: Self-pay | Admitting: Nurse Practitioner

## 2023-02-11 ENCOUNTER — Inpatient Hospital Stay (HOSPITAL_BASED_OUTPATIENT_CLINIC_OR_DEPARTMENT_OTHER): Payer: Medicare HMO | Admitting: Nurse Practitioner

## 2023-02-11 ENCOUNTER — Inpatient Hospital Stay: Payer: Medicare HMO | Attending: Nurse Practitioner

## 2023-02-11 ENCOUNTER — Telehealth: Payer: Self-pay

## 2023-02-11 VITALS — BP 134/78 | HR 100 | Temp 98.2°F | Resp 18 | Ht 69.0 in | Wt 366.6 lb

## 2023-02-11 DIAGNOSIS — D500 Iron deficiency anemia secondary to blood loss (chronic): Secondary | ICD-10-CM | POA: Insufficient documentation

## 2023-02-11 LAB — RETIC PANEL
Immature Retic Fract: 29.6 % — ABNORMAL HIGH (ref 2.3–15.9)
RBC.: 4.03 MIL/uL (ref 3.87–5.11)
Retic Count, Absolute: 64.1 10*3/uL (ref 19.0–186.0)
Retic Ct Pct: 1.6 % (ref 0.4–3.1)
Reticulocyte Hemoglobin: 18.8 pg — ABNORMAL LOW (ref >27.9)

## 2023-02-11 LAB — CBC WITH DIFFERENTIAL (CANCER CENTER ONLY)
Abs Immature Granulocytes: 0.08 10*3/uL — ABNORMAL HIGH (ref 0.00–0.07)
Basophils Absolute: 0 10*3/uL (ref 0.0–0.1)
Basophils Relative: 0 %
Eosinophils Absolute: 0.1 10*3/uL (ref 0.0–0.5)
Eosinophils Relative: 2 %
HCT: 29.3 % — ABNORMAL LOW (ref 36.0–46.0)
Hemoglobin: 8.6 g/dL — ABNORMAL LOW (ref 12.0–15.0)
Immature Granulocytes: 1 %
Lymphocytes Relative: 45 %
Lymphs Abs: 3.5 10*3/uL (ref 0.7–4.0)
MCH: 21.2 pg — ABNORMAL LOW (ref 26.0–34.0)
MCHC: 29.4 g/dL — ABNORMAL LOW (ref 30.0–36.0)
MCV: 72.2 fL — ABNORMAL LOW (ref 80.0–100.0)
Monocytes Absolute: 0.6 10*3/uL (ref 0.1–1.0)
Monocytes Relative: 8 %
Neutro Abs: 3.4 10*3/uL (ref 1.7–7.7)
Neutrophils Relative %: 44 %
Platelet Count: 211 10*3/uL (ref 150–400)
RBC: 4.06 MIL/uL (ref 3.87–5.11)
RDW: 18.7 % — ABNORMAL HIGH (ref 11.5–15.5)
WBC Count: 7.8 10*3/uL (ref 4.0–10.5)
nRBC: 0.3 % — ABNORMAL HIGH (ref 0.0–0.2)

## 2023-02-11 LAB — IRON AND IRON BINDING CAPACITY (CC-WL,HP ONLY)
Iron: 15 ug/dL — ABNORMAL LOW (ref 28–170)
Saturation Ratios: 3 % — ABNORMAL LOW (ref 10.4–31.8)
TIBC: 458 ug/dL — ABNORMAL HIGH (ref 250–450)
UIBC: 443 ug/dL — ABNORMAL HIGH (ref 148–442)

## 2023-02-11 LAB — FERRITIN: Ferritin: 9 ng/mL — ABNORMAL LOW (ref 11–307)

## 2023-02-11 MED ORDER — FOLIC ACID 1 MG PO TABS: 1.0000 mg | ORAL_TABLET | Freq: Every day | ORAL | 3 refills | Status: AC

## 2023-02-11 NOTE — Telephone Encounter (Signed)
Lacie, patient will be scheduled as soon as possible.  Auth Submission: NO AUTH NEEDED Site of care: Site of care: [facility] Payer: [employer] and [place] [employer] Medication & CPT/J Code(s) submitted: Venofer (Iron Sucrose) J1756 Route of submission (phone, fax, portal):  Phone # Fax # Auth type: Buy/Bill PB Units/visits requested: 500mg  x 2 doses Reference number:  Approval from: 02/11/23 to 03/19/23

## 2023-02-18 ENCOUNTER — Other Ambulatory Visit: Payer: Self-pay | Admitting: Nurse Practitioner

## 2023-02-18 ENCOUNTER — Ambulatory Visit (INDEPENDENT_AMBULATORY_CARE_PROVIDER_SITE_OTHER): Payer: Medicare HMO | Admitting: Internal Medicine

## 2023-02-18 ENCOUNTER — Encounter (INDEPENDENT_AMBULATORY_CARE_PROVIDER_SITE_OTHER): Payer: Self-pay | Admitting: Internal Medicine

## 2023-02-18 VITALS — BP 130/78 | HR 79 | Temp 98.0°F | Ht 69.0 in | Wt 358.0 lb

## 2023-02-18 DIAGNOSIS — Z6841 Body Mass Index (BMI) 40.0 and over, adult: Secondary | ICD-10-CM | POA: Diagnosis not present

## 2023-02-18 DIAGNOSIS — R7303 Prediabetes: Secondary | ICD-10-CM | POA: Diagnosis not present

## 2023-02-18 DIAGNOSIS — T50905A Adverse effect of unspecified drugs, medicaments and biological substances, initial encounter: Secondary | ICD-10-CM

## 2023-02-18 DIAGNOSIS — E66813 Obesity, class 3: Secondary | ICD-10-CM

## 2023-02-18 DIAGNOSIS — I10 Essential (primary) hypertension: Secondary | ICD-10-CM

## 2023-02-18 MED ORDER — METFORMIN HCL ER 500 MG PO TB24: 500.0000 mg | ORAL_TABLET | Freq: Two times a day (BID) | ORAL | 0 refills | Status: DC

## 2023-02-18 NOTE — Assessment & Plan Note (Signed)
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  Patient aware of disease state and risk of progression. This may contribute to abnormal cravings, fatigue and diabetic complications without having diabetes.   We have discussed treatment options which include: losing 7 to 10% of body weight, increasing physical activity to a goal of 150 minutes a week at moderate intensity.  Advised to maintain a diet low on simple and processed carbohydrates.  After discussion of benefits and side effects she will be started on metformin XR 500 mg twice daily.

## 2023-02-18 NOTE — Assessment & Plan Note (Signed)
Since 2022 she has lost close to 60 pounds.  She lives at a hotel and works at [employer].  She does not have coverage for GLP-1 therapy. Patient has been making good progress implementing nutritional strategies to lose weight also reducing processed foods.  She is on several medications that may contribute to weight gain which will slow down her weight loss progress.  This was again reviewed with her today.  She will be working with prescribing physicians on finding weight neutral options to these medications.

## 2023-02-18 NOTE — Progress Notes (Signed)
Office: [phone]  /  Fax: [phone]  Weight Summary And Biometrics  Vitals Temp: 98 F (36.7 C) BP: 130/78 Pulse Rate: 79   Anthropometric Measurements Height: 5\' 9"  (1.753 m) Weight: (!) 358 lb (162.4 kg) BMI (Calculated): 52.84 Weight at Last Visit: 359 lb Weight Lost Since Last Visit: 1 lb Weight Gained Since Last Visit: 0 lb Starting Weight: 379 lb Total Weight Loss (lbs): 21 lb (9.526 kg)   Body Composition  Body Fat %: 56.1 % Fat Mass (lbs): 201 lbs Muscle Mass (lbs): 149.4 lbs Total Body Water (lbs): 116.2 lbs Visceral Fat Rating : 21    RMR: 1886  Today's Visit #: 4  Starting Date: 06/20/22   Subjective   Chief Complaint: Obesity  Tracy Banks is here to discuss her progress with her obesity treatment plan. She is on the the Category 3 Plan and states she is following her eating plan approximately 50 % of the time. She states she is walking everyday.exercising.  Interval History:   Since last office visit she has lost 1 pounds. She reports fair adherence to reduced calorie nutritional plan. She has been working on reading food labels, not skipping meals, increasing protein intake at every meal, eating more fruits, eating more vegetables, drinking more water, making healthier choices, eating out less, reducing portion sizes, and incorporating more whole foods   Orexigenic Control:  Denies problems with appetite and hunger signals.  Denies problems with satiety and satiation.  Denies problems with eating patterns and portion control.  Denies abnormal cravings. Denies feeling deprived or restricted.   Barriers identified: having difficulty with meal prep and planning, exposure to enticing environments and/or relationships, low volume of physical activity at present , medical comorbidities, and presence of obesogenic drugs.   Pharmacotherapy for weight loss: She is currently taking no anti-obesity medication.   Assessment and Plan   Treatment  Plan For Obesity:  Recommended Dietary Goals  Tynisha is currently in the action stage of change. As such, her goal is to continue weight management plan. She has agreed to: continue current plan  Behavioral Intervention  We discussed the following Behavioral Modification Strategies today: continue to work on maintaining a reduced calorie state, getting the recommended amount of protein, incorporating whole foods, making healthy choices, staying well hydrated and practicing mindfulness when eating..  Additional resources provided today: None  Recommended Physical Activity Goals  Izel has been advised to work up to 150 minutes of moderate intensity aerobic activity a week and strengthening exercises 2-3 times per week for cardiovascular health, weight loss maintenance and preservation of muscle mass.   She has agreed to :  Think about enjoyable ways to increase daily physical activity and overcoming barriers to exercise and Increase physical activity in their day and reduce sedentary time (increase NEAT).  Pharmacotherapy  We discussed various medication options to help [facility] with her weight loss efforts and we both agreed to :  she does not have coverage for GLP - 1, after discussion of benefits and side effect she will be started on metformin XR 500 mg twice daily.  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  Patient aware of disease state and risk of progression. This may contribute to abnormal cravings, fatigue and diabetic complications without having diabetes.   We have discussed treatment  options which include: losing 7 to 10% of body weight, increasing physical activity to a goal of 150 minutes a week at moderate intensity.  Advised to maintain a diet low on simple and processed  carbohydrates.  After discussion of benefits and side effects she will be started on metformin XR 500 mg twice daily.   Orders: -     metFORMIN HCl ER; Take 1 tablet (500 mg total) by mouth 2 (two) times daily with a meal.  Dispense: 60 tablet; Refill: 0  Primary hypertension Assessment & Plan: Improved.  On propranolol which may contribute to weight gain.  I recommend considering a weight neutral beta-blocker like carvedilol or Bystolic.  Losing 10% of body weight may improve condition.   Class 3 severe obesity with serious comorbidity and body mass index (BMI) of 50.0 to 59.9 in adult, unspecified obesity type [facility]) Assessment & Plan: Since 2022 she has lost close to 60 pounds.  She lives at a hotel and works at [employer].  She does not have coverage for GLP-1 therapy. Patient has been making good progress implementing nutritional strategies to lose weight also reducing processed foods.  She is on several medications that may contribute to weight gain which will slow down her weight loss progress.  This was again reviewed with her today.  She will be working with prescribing physicians on finding weight neutral options to these medications.   Weight gain due to medication Assessment & Plan: Patient on multiple obesogenic medications.  We discussed how these medications may cause weight gain and affect her weight loss.  She will be started on metformin to offset weight gain associated with psychotropic medication.      Objective   Physical Exam:  Blood pressure 130/78, pulse 79, temperature 98 F (36.7 C), height 5\' 9"  (1.753 m), weight (!) 358 lb (162.4 kg). Body mass index is 52.87 kg/m.  General: She is overweight, cooperative, alert, well developed, and in no acute distress. PSYCH: Has normal mood, affect and thought process.   HEENT: EOMI, sclerae are anicteric. Lungs: Normal breathing effort, no conversational dyspnea. Extremities: No edema.  Neurologic: No gross sensory  or motor deficits. No tremors or fasciculations noted.    Diagnostic Data Reviewed:  BMET    Component Value Date/Time   NA 140 01/12/2023 1301   NA 144 06/20/2022 1454   K 3.6 01/12/2023 1301   CL 104 01/12/2023 1301   CO2 27 01/12/2023 1301   GLUCOSE 88 01/12/2023 1301   BUN 11 01/12/2023 1301   BUN 10 06/20/2022 1454   CREATININE 1.08 (H) 01/12/2023 1301   CALCIUM 8.8 (L) 01/12/2023 1301   GFRNONAA >60 01/12/2023 1301   GFRAA 76 06/11/2019 1024   Lab Results  Component Value Date   HGBA1C 5.7 (H) 06/20/2022   HGBA1C 5.3 06/11/2019   Lab Results  Component Value Date   INSULIN 46.4 (H) 06/20/2022   Lab Results  Component Value Date   TSH 1.500 06/20/2022   CBC    Component Value Date/Time   WBC 7.8 02/11/2023 1247   WBC 7.4 01/12/2023 1301   RBC 4.06 02/11/2023 1247   RBC 4.03 02/11/2023 1246   HGB 8.6 (L) 02/11/2023 1247   HGB 11.4 06/20/2022 1454   HCT 29.3 (L) 02/11/2023 1247   HCT 37.4 06/20/2022 1454   PLT 211 02/11/2023 1247   PLT 279 06/20/2022 1454   MCV 72.2 (L) 02/11/2023 1247   MCV 74 (L) 06/20/2022 1454   MCH 21.2 (L)  02/11/2023 1247   MCHC 29.4 (L) 02/11/2023 1247   RDW 18.7 (H) 02/11/2023 1247   RDW 17.3 (H) 06/20/2022 1454   Iron Studies    Component Value Date/Time   IRON 15 (L) 02/11/2023 1247   TIBC 458 (H) 02/11/2023 1247   FERRITIN 9 (L) 02/11/2023 1247   IRONPCTSAT 3 (L) 02/11/2023 1247   Lipid Panel     Component Value Date/Time   CHOL 98 (L) 06/20/2022 1454   TRIG 97 06/20/2022 1454   HDL 45 06/20/2022 1454   LDLCALC 34 06/20/2022 1454   Hepatic Function Panel     Component Value Date/Time   PROT 6.5 01/12/2023 1301   PROT 7.9 06/20/2022 1454   ALBUMIN 2.9 (L) 01/12/2023 1301   ALBUMIN 4.1 06/20/2022 1454   AST 31 01/12/2023 1301   ALT 20 01/12/2023 1301   ALKPHOS 75 01/12/2023 1301   BILITOT 0.3 01/12/2023 1301   BILITOT 0.3 06/20/2022 1454      Component Value Date/Time   TSH 1.500 06/20/2022 1454    Nutritional Lab Results  Component Value Date   VD25OH 29.1 (L) 06/20/2022   VD25OH 10.0 (L) 07/08/2019    Follow-Up   Return in about 4 weeks (around 03/18/2023) for For Weight Mangement with Dr. Rikki Spearing.Marland Kitchen She was informed of the importance of frequent follow up visits to maximize her success with intensive lifestyle modifications for her multiple health conditions.  Attestation Statement   Reviewed by clinician on day of visit: allergies, medications, problem list, medical history, surgical history, family history, social history, and previous encounter notes.     Worthy Rancher, MD

## 2023-02-18 NOTE — Assessment & Plan Note (Signed)
Improved.  On propranolol which may contribute to weight gain.  I recommend considering a weight neutral beta-blocker like carvedilol or Bystolic.  Losing 10% of body weight may improve condition.

## 2023-02-18 NOTE — Assessment & Plan Note (Signed)
Patient on multiple obesogenic medications.  We discussed how these medications may cause weight gain and affect her weight loss.  She will be started on metformin to offset weight gain associated with psychotropic medication.

## 2023-02-19 ENCOUNTER — Encounter: Payer: Self-pay | Admitting: Nurse Practitioner

## 2023-02-19 ENCOUNTER — Other Ambulatory Visit: Payer: Self-pay

## 2023-02-27 ENCOUNTER — Telehealth: Payer: Self-pay | Admitting: Neurology

## 2023-02-27 NOTE — Telephone Encounter (Signed)
Pt called to r\s missed appt

## 2023-03-06 ENCOUNTER — Ambulatory Visit: Payer: Medicare HMO

## 2023-03-06 VITALS — BP 128/81 | HR 88 | Temp 98.0°F | Resp 20 | Ht 69.0 in | Wt 360.6 lb

## 2023-03-06 DIAGNOSIS — D500 Iron deficiency anemia secondary to blood loss (chronic): Secondary | ICD-10-CM

## 2023-03-06 DIAGNOSIS — D509 Iron deficiency anemia, unspecified: Secondary | ICD-10-CM

## 2023-03-06 MED ORDER — IRON SUCROSE 500 MG IVPB - SIMPLE MED
500.0000 mg | Freq: Once | INTRAVENOUS | Status: AC
  Administered 2023-03-06: 500 mg via INTRAVENOUS

## 2023-03-06 MED ORDER — DIPHENHYDRAMINE HCL 25 MG PO CAPS
25.0000 mg | ORAL_CAPSULE | Freq: Once | ORAL | Status: AC
  Administered 2023-03-06: 25 mg via ORAL

## 2023-03-06 MED ORDER — ACETAMINOPHEN 325 MG PO TABS
650.0000 mg | ORAL_TABLET | Freq: Once | ORAL | Status: AC
  Administered 2023-03-06: 650 mg via ORAL

## 2023-03-06 NOTE — Progress Notes (Signed)
Diagnosis: Iron Deficiency Anemia  Provider:  Chilton Greathouse MD  Procedure: IV Infusion  IV Type: Peripheral, IV Location: L Hand  Venofer (Iron Sucrose), Dose: 500 mg  Infusion Start Time: 1037  Infusion Stop Time: 1457  Post Infusion IV Care: Observation period completed and Peripheral IV Discontinued  Discharge: Condition: Good, Destination: Home . AVS Provided  Performed by:  Loney Hering, LPN

## 2023-03-11 ENCOUNTER — Inpatient Hospital Stay: Payer: Medicare HMO

## 2023-03-15 ENCOUNTER — Other Ambulatory Visit: Payer: Self-pay

## 2023-03-15 DIAGNOSIS — D500 Iron deficiency anemia secondary to blood loss (chronic): Secondary | ICD-10-CM

## 2023-03-18 ENCOUNTER — Inpatient Hospital Stay: Payer: Medicare HMO | Attending: Nurse Practitioner

## 2023-03-20 ENCOUNTER — Encounter: Payer: Self-pay | Admitting: Nurse Practitioner

## 2023-03-22 ENCOUNTER — Ambulatory Visit: Payer: 59

## 2023-03-22 ENCOUNTER — Telehealth: Payer: Self-pay

## 2023-03-22 VITALS — BP 102/70 | HR 87 | Temp 97.8°F | Resp 20 | Ht 69.0 in | Wt 360.4 lb

## 2023-03-22 DIAGNOSIS — D509 Iron deficiency anemia, unspecified: Secondary | ICD-10-CM

## 2023-03-22 DIAGNOSIS — D500 Iron deficiency anemia secondary to blood loss (chronic): Secondary | ICD-10-CM

## 2023-03-22 MED ORDER — ACETAMINOPHEN 325 MG PO TABS
650.0000 mg | ORAL_TABLET | Freq: Once | ORAL | Status: AC
  Administered 2023-03-22: 650 mg via ORAL
  Filled 2023-03-22: qty 2

## 2023-03-22 MED ORDER — IRON SUCROSE 500 MG IVPB - SIMPLE MED
500.0000 mg | Freq: Once | INTRAVENOUS | Status: AC
  Administered 2023-03-22: 500 mg via INTRAVENOUS
  Filled 2023-03-22: qty 275

## 2023-03-22 MED ORDER — DIPHENHYDRAMINE HCL 25 MG PO CAPS
25.0000 mg | ORAL_CAPSULE | Freq: Once | ORAL | Status: AC
  Administered 2023-03-22: 25 mg via ORAL
  Filled 2023-03-22: qty 1

## 2023-03-22 NOTE — Progress Notes (Signed)
 Diagnosis: Iron  Deficiency Anemia  Provider:  Praveen Mannam MD  Procedure: IV Infusion  IV Type: Peripheral, IV Location: R Antecubital  Venofer  (Iron  Sucrose), Dose: 500 mg  Infusion Start Time: 1033  Infusion Stop Time: 1430  Post Infusion IV Care: Observation period completed and Peripheral IV Discontinued  Discharge: Condition: Good, Destination: Home . AVS Provided  Performed by:  Bernyce Brimley, RN

## 2023-03-22 NOTE — Telephone Encounter (Signed)
 error

## 2023-03-27 ENCOUNTER — Other Ambulatory Visit (INDEPENDENT_AMBULATORY_CARE_PROVIDER_SITE_OTHER): Payer: Self-pay | Admitting: Internal Medicine

## 2023-03-27 ENCOUNTER — Ambulatory Visit (INDEPENDENT_AMBULATORY_CARE_PROVIDER_SITE_OTHER): Payer: 59 | Admitting: Internal Medicine

## 2023-03-27 ENCOUNTER — Encounter (INDEPENDENT_AMBULATORY_CARE_PROVIDER_SITE_OTHER): Payer: Self-pay | Admitting: Internal Medicine

## 2023-03-27 ENCOUNTER — Encounter: Payer: Self-pay | Admitting: Nurse Practitioner

## 2023-03-27 VITALS — BP 130/80 | HR 82 | Temp 98.1°F | Ht 69.0 in | Wt 350.0 lb

## 2023-03-27 DIAGNOSIS — R948 Abnormal results of function studies of other organs and systems: Secondary | ICD-10-CM

## 2023-03-27 DIAGNOSIS — R29818 Other symptoms and signs involving the nervous system: Secondary | ICD-10-CM

## 2023-03-27 DIAGNOSIS — E66813 Obesity, class 3: Secondary | ICD-10-CM | POA: Diagnosis not present

## 2023-03-27 DIAGNOSIS — R7303 Prediabetes: Secondary | ICD-10-CM

## 2023-03-27 DIAGNOSIS — T50905A Adverse effect of unspecified drugs, medicaments and biological substances, initial encounter: Secondary | ICD-10-CM

## 2023-03-27 DIAGNOSIS — Z6841 Body Mass Index (BMI) 40.0 and over, adult: Secondary | ICD-10-CM

## 2023-03-27 MED ORDER — METFORMIN HCL ER 500 MG PO TB24: 1000.0000 mg | ORAL_TABLET | Freq: Two times a day (BID) | ORAL | 1 refills | Status: DC

## 2023-03-27 NOTE — Progress Notes (Signed)
 Office: [phone]  /  Fax: [phone]  Weight Summary And Biometrics  Vitals Temp: 98.1 F (36.7 C) BP: 130/80 Pulse Rate: 82 SpO2: 96 %   Anthropometric Measurements Height: 5' 9 (1.753 m) Weight: (!) 350 lb (158.8 kg) BMI (Calculated): 51.66 Weight at Last Visit: 358 lb Weight Lost Since Last Visit: 8 lb Weight Gained Since Last Visit: 0 lb Starting Weight: 379 lb Total Weight Loss (lbs): 29 lb (13.2 kg)   Body Composition  Body Fat %: 51.7 % Fat Mass (lbs): 181.2 lbs Muscle Mass (lbs): 161 lbs Total Body Water (lbs): 110.6 lbs Visceral Fat Rating : 19    No data recorded Today's Visit #: 5  Starting Date: 06/20/22   Subjective   Chief Complaint: Obesity  Tracy Banks is here to discuss her progress with her obesity treatment plan. She is on the the Category 3 Plan and states she is following her eating plan approximately 75 % of the time. She states she is exercising 30 minutes 7 times per week.  Interval History:   Since last office visit she has lost 8 pounds. She reports good adherence to reduced calorie nutritional plan.  She has lost a total of 68 pounds since 2022 34 of these pounds since starting our program.  She is currently on metformin  XR 500 mg twice daily She has been working on reading food labels, not skipping meals, increasing protein intake at every meal, eating more fruits, eating more vegetables, drinking more water, avoiding and / or reducing liquid calories, making healthier choices, continues to exercise, reducing portion sizes, and incorporating more whole foods .  She is scheduled to have sleep consultation for suspected sleep apnea.  Sleeping approximately 8 hours a day.  Sleep described as interrupted.   Stress levels are reported as low and manageable.   Orexigenic Control:  Reports problems with appetite and hunger signals.  Reports problems with satiety and satiation.  Reports problems with eating patterns and portion  control.  Denies abnormal cravings. Denies feeling deprived or restricted.   Barriers identified: cost of healthy foods, strong hunger signals and impaired satiety / inhibitory control, medical comorbidities, presence of obesogenic drugs, inadequate sleep, and unstable housing.   Pharmacotherapy for weight loss: She is currently taking Metformin  (off label use for incretin effect and / or insulin  resistance and / or diabetes prevention) with adequate clinical response  and without side effects..   Assessment and Plan   Treatment Plan For Obesity:  Recommended Dietary Goals  Tracy Banks is currently in the action stage of change. As such, her goal is to continue weight management plan. She has agreed to: continue current plan  Behavioral Intervention  We discussed the following Behavioral Modification Strategies today: increasing lean protein intake to established goals and continue to work on maintaining a reduced calorie state, getting the recommended amount of protein, incorporating whole foods, making healthy choices, staying well hydrated and practicing mindfulness when eating..  Additional resources provided today: None  Recommended Physical Activity Goals  Tracy Banks has been advised to work up to 150 minutes of moderate intensity aerobic activity a week and strengthening exercises 2-3 times per week for cardiovascular health, weight loss maintenance and preservation of muscle mass.   She has agreed to :  continue to gradually increase the amount and intensity of exercise routine.  She will walk with 5 pound dumbbells to increase workout intensity as she is mostly doing walking.  Pharmacotherapy  We discussed various medication options to help Tracy Banks  with her weight loss efforts and we both agreed to : increase metformin  XR 500 mg to 1000 mg twice daily  Associated Conditions Addressed and Impacted by Obesity Treatment  Class 3 severe obesity with serious comorbidity and body mass  index (BMI) of 50.0 to 59.9 in adult, unspecified obesity type Tracy Banks) Assessment & Plan: Patient has lost 68 pounds since 2022 and approximately 34 pounds since starting her program.  She has made significant lifestyle changes and has increased physical activity.  She does have strong hunger signals and feels at times challenge in regards to appetite.  Tracy Banks has had good response to metformin  we will therefore increase medication to 1000 mg twice daily.  This will also help offset weight gain associated with psychotropic medication.  Her insurance does not cover GLP-1.  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  Patient aware of disease state and risk of progression. This may contribute to abnormal cravings, fatigue and diabetic complications without having diabetes.   We have discussed treatment options which include: losing 7 to 10% of body weight, increasing physical activity to a goal of 150 minutes a week at moderate intensity.  Advised to maintain a diet low on simple and processed carbohydrates.  She has now metformin  XR 500 mg twice daily for pharmacoprophylaxis.  This will be increased to 1000 mg twice daily   Orders: -     metFORMIN  HCl ER; Take 2 tablets (1,000 mg total) by mouth 2 (two) times daily with a meal.  Dispense: 120 tablet; Refill: 1  Suspected sleep apnea Assessment & Plan: Patient has a high risk phenotype and also symptoms suggestive of disordered sleep breathing.  She had been referred for sleep study and is scheduled for consultation in the near future. She is also on several CNS depressing drugs which may worsen her sleep apnea.  We are really proud of her she has lost 68 pounds since 2022, and is very committed to improving her overall health.  Losing  15% may reduce the risk of sleep apnea.  We will follow-up on sleep study results.  If patient has moderate to severe OSA she may qualify for GLP-1 therapy with Zepbound .   Weight gain due to medication Assessment & Plan: Patient on multiple obesogenic medications.  We discussed how these medications may cause weight gain and affect her weight loss.  She is currently on metformin  to help offset some of the weight gain.  We are increasing medication today to 1000 mg twice daily.   Abnormal metabolism Assessment & Plan: Patient has a slower than predicted metabolism. IC 1886 vs. calculated 2465. This may contribute to weight gain, chronic fatigue and difficulty losing weight.  Likely secondary to low volume of physical activity, obesogenic medications, genetics and possibly untreated sleep apnea.  She is now scheduled for sleep consultation.  Her psychotropic medications remain about the same.  We reviewed measures to improve metabolism including not skipping meals, progressive strengthening exercises, increasing protein intake at every meal and maintaining adequate hydration and sleep.        Objective   Physical Exam:  Blood pressure 130/80, pulse 82, temperature 98.1 F (36.7 C), height 5' 9 (1.753 m), weight (!) 350 lb (158.8 kg), SpO2 96%. Body mass index is  51.69 kg/m.  General: She is overweight, cooperative, alert, well developed, and in no acute distress. PSYCH: Has normal mood, affect and thought process.   HEENT: EOMI, sclerae are anicteric. Lungs: Normal breathing effort, no conversational dyspnea. Extremities: No edema.  Neurologic: No gross sensory or motor deficits. No tremors or fasciculations noted.    Diagnostic Data Reviewed:  BMET    Component Value Date/Time   NA 140 01/12/2023 1301   NA 144 06/20/2022 1454   K 3.6 01/12/2023 1301   CL 104 01/12/2023 1301   CO2 27 01/12/2023 1301   GLUCOSE 88 01/12/2023 1301   BUN 11 01/12/2023 1301   BUN 10  06/20/2022 1454   CREATININE 1.08 (H) 01/12/2023 1301   CALCIUM 8.8 (L) 01/12/2023 1301   GFRNONAA >60 01/12/2023 1301   GFRAA 76 06/11/2019 1024   Lab Results  Component Value Date   HGBA1C 5.7 (H) 06/20/2022   HGBA1C 5.3 06/11/2019   Lab Results  Component Value Date   INSULIN  46.4 (H) 06/20/2022   Lab Results  Component Value Date   TSH 1.500 06/20/2022   CBC    Component Value Date/Time   WBC 7.8 02/11/2023 1247   WBC 7.4 01/12/2023 1301   RBC 4.06 02/11/2023 1247   RBC 4.03 02/11/2023 1246   HGB 8.6 (L) 02/11/2023 1247   HGB 11.4 06/20/2022 1454   HCT 29.3 (L) 02/11/2023 1247   HCT 37.4 06/20/2022 1454   PLT 211 02/11/2023 1247   PLT 279 06/20/2022 1454   MCV 72.2 (L) 02/11/2023 1247   MCV 74 (L) 06/20/2022 1454   MCH 21.2 (L) 02/11/2023 1247   MCHC 29.4 (L) 02/11/2023 1247   RDW 18.7 (H) 02/11/2023 1247   RDW 17.3 (H) 06/20/2022 1454   Iron  Studies    Component Value Date/Time   IRON  15 (L) 02/11/2023 1247   TIBC 458 (H) 02/11/2023 1247   FERRITIN 9 (L) 02/11/2023 1247   IRONPCTSAT 3 (L) 02/11/2023 1247   Lipid Panel     Component Value Date/Time   CHOL 98 (L) 06/20/2022 1454   TRIG 97 06/20/2022 1454   HDL 45 06/20/2022 1454   LDLCALC 34 06/20/2022 1454   Hepatic Function Panel     Component Value Date/Time   PROT 6.5 01/12/2023 1301   PROT 7.9 06/20/2022 1454   ALBUMIN 2.9 (L) 01/12/2023 1301   ALBUMIN 4.1 06/20/2022 1454   AST 31 01/12/2023 1301   ALT 20 01/12/2023 1301   ALKPHOS 75 01/12/2023 1301   BILITOT 0.3 01/12/2023 1301   BILITOT 0.3 06/20/2022 1454      Component Value Date/Time   TSH 1.500 06/20/2022 1454   Nutritional Lab Results  Component Value Date   VD25OH 29.1 (L) 06/20/2022   VD25OH 10.0 (L) 07/08/2019    Follow-Up   Return in about 3 weeks (around 04/17/2023) for For Weight Mangement with Dr. Francyne.SABRA She was informed of the importance of frequent follow up visits to maximize her success with intensive  lifestyle modifications for her multiple health conditions.  Attestation Statement   Reviewed by clinician on day of visit: allergies, medications, problem list, medical history, surgical history, family history, social history, and previous encounter notes.     Lucas Francyne, MD

## 2023-03-27 NOTE — Assessment & Plan Note (Signed)
 Patient has a slower than predicted metabolism. IC 1886 vs. calculated 2465. This may contribute to weight gain, chronic fatigue and difficulty losing weight.  Likely secondary to low volume of physical activity, obesogenic medications, genetics and possibly untreated sleep apnea.  She is now scheduled for sleep consultation.  Her psychotropic medications remain about the same.  We reviewed measures to improve metabolism including not skipping meals, progressive strengthening exercises, increasing protein intake at every meal and maintaining adequate hydration and sleep.

## 2023-03-27 NOTE — Assessment & Plan Note (Signed)
 Patient on multiple obesogenic medications.  We discussed how these medications may cause weight gain and affect her weight loss.  She is currently on metformin to help offset some of the weight gain.  We are increasing medication today to 1000 mg twice daily.

## 2023-03-27 NOTE — Assessment & Plan Note (Addendum)
 Patient has lost 68 pounds since 2022 and approximately 34 pounds since starting her program.  She has made significant lifestyle changes and has increased physical activity.  She does have strong hunger signals and feels at times challenge in regards to appetite.  He has had good response to metformin  we will therefore increase medication to 1000 mg twice daily.  This will also help offset weight gain associated with psychotropic medication.  Her insurance does not cover GLP-1.  She may qualify if found to moderate to severe sleep apnea

## 2023-03-27 NOTE — Assessment & Plan Note (Signed)
 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  Patient aware of disease state and risk of progression. This may contribute to abnormal cravings, fatigue and diabetic complications without having diabetes.   We have discussed treatment options which include: losing 7 to 10% of body weight, increasing physical activity to a goal of 150 minutes a week at moderate intensity.  Advised to maintain a diet low on simple and processed carbohydrates.  She has now metformin  XR 500 mg twice daily for pharmacoprophylaxis.  This will be increased to 1000 mg twice daily

## 2023-03-27 NOTE — Assessment & Plan Note (Signed)
 Patient has a high risk phenotype and also symptoms suggestive of disordered sleep breathing.  She had been referred for sleep study and is scheduled for consultation in the near future. She is also on several CNS depressing drugs which may worsen her sleep apnea.  We are really proud of her she has lost 68 pounds since 2022, and is very committed to improving her overall health.  Losing 15% may reduce the risk of sleep apnea.  We will follow-up on sleep study results.  If patient has moderate to severe OSA she may qualify for GLP-1 therapy with Zepbound .

## 2023-04-02 ENCOUNTER — Other Ambulatory Visit: Payer: Self-pay

## 2023-04-02 DIAGNOSIS — G43711 Chronic migraine without aura, intractable, with status migrainosus: Secondary | ICD-10-CM

## 2023-04-02 MED ORDER — NORTRIPTYLINE HCL 50 MG PO CAPS: ORAL_CAPSULE | ORAL | 0 refills | Status: DC

## 2023-04-03 ENCOUNTER — Ambulatory Visit: Payer: 59 | Admitting: Pediatrics

## 2023-04-03 ENCOUNTER — Encounter: Payer: Self-pay | Admitting: Nurse Practitioner

## 2023-04-03 ENCOUNTER — Encounter: Payer: Self-pay | Admitting: Pediatrics

## 2023-04-03 VITALS — BP 128/84 | Ht 69.0 in | Wt 361.0 lb

## 2023-04-03 DIAGNOSIS — D509 Iron deficiency anemia, unspecified: Secondary | ICD-10-CM | POA: Diagnosis not present

## 2023-04-03 DIAGNOSIS — K5900 Constipation, unspecified: Secondary | ICD-10-CM

## 2023-04-03 DIAGNOSIS — R197 Diarrhea, unspecified: Secondary | ICD-10-CM

## 2023-04-03 DIAGNOSIS — K529 Noninfective gastroenteritis and colitis, unspecified: Secondary | ICD-10-CM

## 2023-04-03 DIAGNOSIS — D500 Iron deficiency anemia secondary to blood loss (chronic): Secondary | ICD-10-CM

## 2023-04-03 MED ORDER — SUTAB 1479-225-188 MG PO TABS: 24.0000 | ORAL_TABLET | Freq: Once | ORAL | 0 refills | Status: AC

## 2023-04-03 NOTE — Patient Instructions (Signed)
 You have been scheduled for an endoscopy and colonoscopy. Please follow the written instructions given to you at your visit today.  Please pick up your prep supplies at the pharmacy within the next 1-3 days.  If you use inhalers (even only as needed), please bring them with you on the day of your procedure.  DO NOT TAKE 7 DAYS PRIOR TO TEST- Trulicity (dulaglutide) Ozempic , Wegovy  (semaglutide ) Mounjaro (tirzepatide ) Bydureon Bcise (exanatide extended release)  DO NOT TAKE 1 DAY PRIOR TO YOUR TEST Rybelsus  (semaglutide ) Adlyxin (lixisenatide) Victoza (liraglutide) Byetta (exanatide) ___________________________________________________________________________   Tracy Banks will receive your bowel preparation through [facility], which ensures the lowest copay and home delivery, with outreach via text or call from an [phone] number. Please respond promptly to avoid rescheduling. If you are interested in alternative options or have any questions please contact them at [phone]  Your Provider Has Sent Your Bowel Prep Regimen To [facility] What to expect. [facility] will contact you to verify your information and collect your copay, if applicable. Enjoy the comfort of your home while we deliver your prescription to you, free of any shipping charges. Fast, FREE delivery or shipping. [facility] accepts all major insurance benefits and applies discounts & coupons  Have additional questions? [facility]'s patient care team is always here to help.  Chat: [email or web address] Call: [phone] Email: [email or web address] NCPDP: [id number] How will we contact you? Welcome Phone call  a Welcome text and a Checkout link in a text Texts you receive from [phone] Are Not Spam.   *To set up delivery, you must complete the checkout process via link or speak to one of our patient care representatives. If we are unable to reach you, your prescription may be delayed.    _______________________________________________________  If your blood pressure at your visit was 140/90 or greater, please contact your primary care physician to follow up on this.  _______________________________________________________  If you are age 46 or older, your body mass index should be between 23-30. Your Body mass index is 53.31 kg/m. If this is out of the aforementioned range listed, please consider follow up with your Primary Care Provider.  If you are age 37 or younger, your body mass index should be between 19-25. Your Body mass index is 53.31 kg/m. If this is out of the aformentioned range listed, please consider follow up with your Primary Care Provider.   ________________________________________________________  The [facility] providers would like to encourage you to use MYCHART to communicate with providers for non-urgent requests or questions.  Due to long hold times on the telephone, sending your provider a message by [facility] may be a faster and more efficient way to get a response.  Please allow 48 business hours for a response.  Please remember that this is for non-urgent requests.   It was a pleasure to see you today!  Thank you for trusting me with your gastrointestinal care!    Eugenia Hess, MD

## 2023-04-03 NOTE — Progress Notes (Signed)
 [facility] Initial Consultation   Referring Provider Lorella Roles, MD [address] [place],  [place] [address]  Primary Care Provider Lorella Roles, MD  Patient Profile: Tracy Banks is a 43 y.o. female with a past medical history noteworthy for GERD, anxiety, arthritis, asthma, depression, eczema, fibromyalgia, gout, HL, HTN, pre-DM, lupus, migraines, and vitamin D  efficiency who is seen in consultation in the [facility] at the request of Dr. Rito Chess for evaluation and management of the problem(s) noted below.  Problem List: Iron  deficiency anemia Alternating constipation and diarrhea   History of Present Illness   Tracy Banks is a 43 y.o. female with a history of GERD, anxiety, arthritis, asthma, depression, eczema, fibromyalgia, gout, HL, HTN, pre-DM, lupus, migraines, and vitamin D  efficiency who presents for evaluation of iron  deficiency anemia and chronic diarrhea.  Iron  deficiency anemia - Heme notes document normal CBC 05/2019 and previously - 2023-2024 labs showed hemoglobin 11.4-11.7, MCV has been - 12/2022 iron  23, TIBC 464, ferritin 9.4 - Started on oral iron  and has received 2 iron  fusions  - Denies melena or hematochezia - No known family history of gastric cancer, colorectal cancer or polyps - Has never had an upper endoscopy or colonoscopy - As below, endorses chronic diarrhea that seems to be food related without blood or mucus - Endorses a previous history of heavy menstrual cycles; now off Depo-Provera her menses are regular - Not on anticoagulation  Alternating constipation and diarrhea - Describes longstanding history of irregular bowel habits with alternating constipation and diarrhea - When constipated, she will defecate approximately 2 times per week -stools can be hard and difficult to pass - Reports loose, watery diarrhea with the ingestion of milk, cheese, coffee - Sometimes has diarrhea even when using Lactaid  products - States that she cannot eat at restaurants because she always develops diarrhea and GI symptoms; denies having any similar symptoms when cooking at home - Has associated abdominal cramping and discomfort when diarrhea is present - Denies laxative use - No nausea, vomiting, fevers or chills, weight loss - No family history of inflammatory bowel disease, celiac disease  Last colonoscopy: None Last endoscopy: None  Last Abd CT/CTE/MRE: None  GI Review of Symptoms Significant for constipation, diarrhea. Otherwise negative.  General Review of Systems  Review of systems is significant for the pertinent positives and negatives as listed per the HPI.  Full ROS is otherwise negative.  Past Medical History   Past Medical History:  Diagnosis Date   Acid reflux    Anemia    Anxiety    Arthritis    Asthma    Back pain    Carpal tunnel syndrome    Depression    Eczema    Fibromyalgia    Gout    History of swelling of feet    Hyperlipidemia    Hypertension    Incontinence    Lupus    Migraine    Osteoarthritis    Overactive bladder    Pneumonia    Polyarthralgia    Vitamin D  deficiency       Past Surgical History   Past Surgical History:  Procedure Laterality Date   ANKLE SURGERY Bilateral    CHONDROPLASTY Right 01/04/2022   Procedure: KNEE ARTHROSCOPY CHONDROPLASTY;  Surgeon: Genevie Kerns, MD;  Location: [facility];  Service: Orthopedics;  Laterality: Right;  knee block  60   KNEE ARTHROSCOPY WITH LATERAL MENISECTOMY Right 01/04/2022   Procedure: KNEE ARTHROSCOPY WITH PARTIAL LATERAL MENISECTOMY;  Surgeon: Genevie Kerns,  MD;  Location: [facility];  Service: Orthopedics;  Laterality: Right;  knee block 60   LEG SURGERY Right    Muscle stretched   NEUROMA SURGERY        Allergies and Medications   Allergies  Allergen Reactions   Penicillins Palpitations    Rapid heart beat Has patient had a PCN reaction causing immediate rash, facial/tongue/throat  swelling, SOB or lightheadedness with hypotension: no Has patient had a PCN reaction causing severe rash involving mucus membranes or skin necrosis: no Has patient had a PCN reaction that required hospitalization unknown Has patient had a PCN reaction occurring within the last 10 years: no If all of the above answers are "NO", then may proceed with Cephalosporin use.  Rapid heart beat Has patient had a PCN reaction causing immediate rash, facial/tongue/throat swelling, SOB or lightheadedness with hypotension: no Has patient had a PCN reaction causing severe rash involving mucus membranes or skin necrosis: no Has patient had a PCN reaction that required hospitalization unknown Has patient had a PCN reaction occurring within the last 10 years: no If all of the above answers are "NO", then may proceed with Cephalosporin use. Rapid heart beat Has patient had a PCN reaction causing immediate rash, facial/tongue/throat swelling, SOB or lightheadedness with hypotension: no Has patient had a PCN reaction causing severe rash involving mucus membranes or skin necrosis: no Has patient had a PCN reaction that required hospitalization unknown Has patient had a PCN reaction occurring within the last 10 years: no If all of the above answers are "NO", then may proceed with Cephalosporin use. Other reaction(s): Tachycardia   Current Meds  Medication Sig   Cholecalciferol (VITAMIN D3) 50 MCG (2000 UT) capsule Take 1 capsule (2,000 Units total) by mouth daily.   DULoxetine  (CYMBALTA ) 60 MG capsule TAKE 1 CAPSULE(60 MG) BY MOUTH DAILY   EMGALITY  120 MG/ML SOAJ Inject 140 mg into the skin every 30 (thirty) days.   folic acid  (FOLVITE ) 1 MG tablet Take 1 tablet (1 mg total) by mouth daily.   HYDROcodone -acetaminophen  (NORCO/VICODIN) 5-325 MG tablet Take 1-2 tablets by mouth every 6 (six) hours as needed.   hydroxychloroquine (PLAQUENIL) 200 MG tablet Take 200 mg by mouth 2 (two) times daily.   ibuprofen   (ADVIL ) 800 MG tablet Take 800 mg by mouth every 8 (eight) hours as needed.   metFORMIN  (GLUCOPHAGE -XR) 500 MG 24 hr tablet Take 2 tablets (1,000 mg total) by mouth 2 (two) times daily with a meal.   nortriptyline  (PAMELOR ) 50 MG capsule TAKE 2 CAPSULES(100 MG) BY MOUTH AT BEDTIME. APPOINTMENT NEEDED FOR FURTHER REFILLS   nystatin (MYCOSTATIN) 100000 UNIT/ML suspension    omeprazole (PRILOSEC) 40 MG capsule Take 40 mg by mouth daily.   pregabalin  (LYRICA ) 150 MG capsule Take 1 capsule (150 mg total) by mouth in the morning, at noon, and at bedtime.   propranolol  (INDERAL ) 40 MG tablet TAKE 1 TABLET(40 MG) BY MOUTH TWICE DAILY   rosuvastatin (CRESTOR) 10 MG tablet Take 10 mg by mouth daily.   Sodium Sulfate-Mag Sulfate-KCl (SUTAB ) [id number] MG TABS Take 24 tablets by mouth once for 1 dose. BIN: [id number] PCN: CN GROUP: [id number] MEMBER ID: [id number];GEX AS CASH;NO PRIOR AUTHORIZATION   tolterodine (DETROL LA) 4 MG 24 hr capsule Take 4 mg by mouth daily.   topiramate  (TOPAMAX ) 100 MG tablet One in am/ 2 at night   torsemide (DEMADEX) 20 MG tablet Take 20 mg by mouth daily.   traMADol (ULTRAM) 50 MG tablet Take 50  mg by mouth daily.   XTAMPZA  ER 13.5 MG C12A Take 1 capsule by mouth 2 (two) times daily.   ziprasidone (GEODON) 20 MG capsule Take 20 mg by mouth See admin instructions. Take with 80 mg for a total of 100 mg daily   ziprasidone (GEODON) 80 MG capsule Take 80 mg by mouth See admin instructions. Take with 20 mg for a total of 100 mg daily   zolpidem (AMBIEN) 5 MG tablet Take 1 tablet by mouth at bedtime as needed.    Family History   Family History  Problem Relation Age of Onset   Sarcoidosis Mother    Migraines Mother    Hypertension Mother    Osteoarthritis Mother    Diabetes Mother    Fibromyalgia Mother    Depression Mother    Sleep apnea Mother    Obesity Mother    Healthy Father    Liver disease Neg Hx    Esophageal cancer Neg Hx    Colon cancer Neg Hx      Social  History   Social History   Social History Narrative   Lives at home with children.   Right-handed.   Caffeine use:  One can soda per day.   Lylee reports that she has been smoking cigarettes. She has a 0.5 pack-year smoking history. She has never used smokeless tobacco. She reports current alcohol use. She reports that she does not use drugs.  Vital Signs and Physical Examination   Vitals:   04/03/23 1441  BP: 128/84   Body mass index is 53.31 kg/m. Weight: (!) 361 lb (163.7 kg)  Constitutional: Obese appearing nontoxic appearing 43 y.o. female in no apparent distress.   Eyes: Anicteric sclerae Head: Multiple piercings in the lips and nose Neck: Normal Pulmonary: Clear to auscultation bilaterally CV: S1, S2, regular rate and rhythm GI/Abdomen: Soft, nontender, nondistended, normal active bowel sounds Anorectal: Deferred Neuro: Alert, nonfocal exam Skin: Warm, dry, no rash Bilateral Extremities: normal   Review of Data  The following data was reviewed at the time of this encounter:  Laboratory Studies      Imaging Studies  None  GI Procedures and Studies  None   Clinical Impression  It is my clinical impression that Tracy Banks is a 43 y.o. female with;  Iron  deficiency anemia Alternating constipation and diarrhea  Tracy Banks is referred for evaluation and management of iron  deficiency anemia constipation and diarrhea. With regard to her history of anemia, the onset appears to be between 2023-2024.  She denies overt GI bleeding in the form of melena or hematochezia.  No other history of bleeding this.  She has had heavy menses in the past.  Currently being treated with oral iron  and IV iron .  We reviewed that GI sources of iron  deficiency anemia can include low iron  in diet, inadequate absorption as well as gastrointestinal losses which can be microscopic and in visible.  Concomitantly, she is endorsing a longstanding history of alternating constipation and  diarrhea.  We discussed the differential diagnosis for her symptoms could include inflammatory bowel disease, food intolerances, SIBO, bile acid diarrhea, IBS, alpha gal allergy  Given her GI symptoms and iron  deficiency anemia it is appropriate to proceed with upper endoscopy and colonoscopy.  Current BMI is 53.3 and therefore endoscopic procedures will be scheduled in a hospital based unit.  Plan  Schedule EGD and colonoscopy in hospital based unit. Further recommendations regarding management of constipation and diarrhea continue provide after results of endoscopic  procedures are available. Continue monitoring diet and food triggers for GI symptoms.  Planned Follow Up 2-4 months  The patient or caregiver verbalized understanding of the material covered, with no barriers to understanding. All questions were answered. Patient or caregiver is agreeable with the plan outlined above.    It was a pleasure to see Tracy Banks.  If you have any questions or concerns regarding this evaluation, do not hesitate to contact me.  Eugenia Hess, MD [facility]   I spent total of 35 minutes in both face-to-face and non-face-to-face activities, excluding procedures performed, for the visit on the date of this encounter.

## 2023-04-04 ENCOUNTER — Telehealth: Payer: Self-pay | Admitting: *Deleted

## 2023-04-04 NOTE — Telephone Encounter (Signed)
Patient has been advised of appointment date change to 06/20/23. She verbalizes understanding of this information.

## 2023-04-04 NOTE — Telephone Encounter (Signed)
Patient was scheduled at her office visit yesterday for an endoscopy and colonoscopy at [facility] on 06/17/23 with Dr Doy Hutching. Unfortunately, there have been some schedule changes and patient will need to instead have her endoscopy/colonoscopy on Thursday, 06/20/23 with Dr Doy Hutching.  I have placed update instructions in mychart for patient review and have left a voicemail for patient to call back as well.

## 2023-04-08 ENCOUNTER — Inpatient Hospital Stay: Payer: Medicare HMO | Attending: Nurse Practitioner

## 2023-04-24 ENCOUNTER — Telehealth (INDEPENDENT_AMBULATORY_CARE_PROVIDER_SITE_OTHER): Payer: Self-pay | Admitting: Internal Medicine

## 2023-04-24 ENCOUNTER — Ambulatory Visit (INDEPENDENT_AMBULATORY_CARE_PROVIDER_SITE_OTHER): Payer: 59 | Admitting: Internal Medicine

## 2023-04-24 ENCOUNTER — Encounter (INDEPENDENT_AMBULATORY_CARE_PROVIDER_SITE_OTHER): Payer: Self-pay | Admitting: Internal Medicine

## 2023-04-24 VITALS — BP 134/82 | HR 72 | Temp 97.6°F | Ht 69.0 in | Wt 364.0 lb

## 2023-04-24 DIAGNOSIS — Z6841 Body Mass Index (BMI) 40.0 and over, adult: Secondary | ICD-10-CM | POA: Diagnosis not present

## 2023-04-24 DIAGNOSIS — T50905A Adverse effect of unspecified drugs, medicaments and biological substances, initial encounter: Secondary | ICD-10-CM

## 2023-04-24 DIAGNOSIS — E66813 Obesity, class 3: Secondary | ICD-10-CM | POA: Diagnosis not present

## 2023-04-24 DIAGNOSIS — R948 Abnormal results of function studies of other organs and systems: Secondary | ICD-10-CM | POA: Diagnosis not present

## 2023-04-24 DIAGNOSIS — R7303 Prediabetes: Secondary | ICD-10-CM | POA: Diagnosis not present

## 2023-04-24 MED ORDER — SEMAGLUTIDE-WEIGHT MANAGEMENT 0.25 MG/0.5ML ~~LOC~~ SOAJ: 0.2500 mg | SUBCUTANEOUS | 0 refills | Status: DC

## 2023-04-24 NOTE — Progress Notes (Signed)
 Office: [phone]  /  Fax: [phone]  Weight Summary And Biometrics  Vitals Temp: 97.6 F (36.4 C) BP: 134/82 Pulse Rate: 72 SpO2: 96 %   Anthropometric Measurements Height: 5' 9 (1.753 m) Weight: (!) 364 lb (165.1 kg) BMI (Calculated): 53.73 Weight at Last Visit: 350 lb Weight Lost Since Last Visit: 0 lb Weight Gained Since Last Visit: 14 lb Starting Weight: 379 lb Total Weight Loss (lbs): 15 lb (6.804 kg)   Body Composition  Body Fat %: 60.2 % Fat Mass (lbs): 219.4 lbs Muscle Mass (lbs): 137.8 lbs Visceral Fat Rating : 23    No data recorded Today's Visit #: 6  Starting Date: 06/20/22   Subjective   Chief Complaint: Obesity  Tracy Banks is here to discuss her progress with her obesity treatment plan. She is on the the Category 3 Plan and states she is following her eating plan approximately 75 % of the time. She states she is exercising 30 minutes 7 times per week.  Weight Progress Since Last Visit:  Discussed the use of AI scribe software for clinical note transcription with the patient, who gave verbal consent to proceed.  History of Present Illness   Tracy Banks is a 43 year old female who presents for medical weight management.  She has experienced a weight gain of fourteen pounds over the past few weeks, which she attributes to increased stress related to her mother and changes in her eating habits. She acknowledges that dietary changes, including increased carbohydrate and fat intake, may have contributed to the weight gain.  She has a history of taking metformin  to counteract weight gain from other medications, specifically Geodon. She attempted to increase the dose of metformin  but experienced intolerable gastrointestinal side effects, including nausea and a feeling of wanting to vomit. She has since reduced the dose to one tablet per day, which is more tolerable, especially when taken with food.  She recently underwent a sleep study, which  reportedly showed no evidence of sleep apnea, contrary to her belief that she moves a lot in her sleep. She describes her sleep pattern as being still when something is on her, such as a baby in the bed, but otherwise moving a lot and even falling in her sleep. The study was completed last Friday, and she is awaiting the final interpretation of the results.       Challenges affecting patient progress: Unstable housing and social economical difficulties.   Tracy Banks: Reports problems with appetite and hunger signals.  Reports problems with satiety and satiation.  Reports problems with eating patterns and portion Banks.  Reports abnormal cravings. Denies feeling deprived or restricted.   Pharmacotherapy for weight management: She is currently taking Metformin  (off label use for incretin effect and / or insulin  resistance and / or diabetes prevention) with adequate clinical response  and experiencing the following side effects: Loose stools are likely secondary to changes in dietary habits..   Assessment and Plan   Treatment Plan For Obesity:  Recommended Dietary Goals  Tracy Banks is currently in the action stage of change. As such, her goal is to continue weight management plan. She has agreed to: continue current plan  Behavioral Health and Counseling  We discussed the following behavioral modification strategies today: continue to work on maintaining a reduced calorie state, getting the recommended amount of protein, incorporating whole foods, making healthy choices, staying well hydrated and practicing mindfulness when eating..  Additional education and resources provided today: None  Recommended Physical Activity  Goals  Tracy Banks has been advised to work up to 150 minutes of moderate intensity aerobic activity a week and strengthening exercises 2-3 times per week for cardiovascular health, weight loss maintenance and preservation of muscle mass.   She has agreed to :  Think  about enjoyable ways to increase daily physical activity and overcoming barriers to exercise and Increase physical activity in their day and reduce sedentary time (increase NEAT).  Pharmacotherapy  We discussed various medication options to help Tracy Banks with her weight loss efforts and we both agreed to : start anti-obesity medication.  In addition to reduced calorie nutrition plan (RCNP), behavioral strategies and physical activity, Tracy Banks would benefit from pharmacotherapy to assist with hunger signals, satiety and cravings. This will reduce obesity-related health risks by inducing weight loss, and help reduce food consumption and adherence to Tracy Banks) . It may also improve QOL by improving self-confidence and reduce the  setbacks associated with metabolic adaptations.  After discussion of treatment options, mechanisms of action, benefits, side effects, contraindications and shared decision making she is agreeable to starting Wegovy  0.25 mg once a week. Patient also made aware that medication is indicated for long-term management of obesity and the risk of weight regain following discontinuation of treatment and hence the importance of adhering to medical weight loss plan.  We demonstrated use of device and patient using teach back method was able to demonstrate proper technique.  and restart metformin  500 mg at a time over 4 weeks.  She was doing well on medication suspect side effects due to dietary changes  Associated Conditions Impacted by Obesity Treatment  Class 3 severe obesity with serious comorbidity and body mass index (BMI) of 50.0 to 59.9 in adult, unspecified obesity type (HCC) -     Semaglutide -Weight Management; Inject 0.25 mg into the skin once a week for 28 days.  Dispense: 2 mL; Refill: 0  Prediabetes -     Semaglutide -Weight Management; Inject 0.25 mg into the skin once a week for 28 days.  Dispense: 2 mL; Refill: 0  Weight gain due to medication -     Semaglutide -Weight  Management; Inject 0.25 mg into the skin once a week for 28 days.  Dispense: 2 mL; Refill: 0  Abnormal metabolism -     Semaglutide -Weight Management; Inject 0.25 mg into the skin once a week for 28 days.  Dispense: 2 mL; Refill: 0    Assessment and Plan    Obesity Tracy Banks reports a 14-pound weight gain over the past few weeks, attributed to stress and metformin  intolerance. Increased caloric intake due to stress and difficulty maintaining her diet are contributing factors. Discussed Wegovy  (semaglutide ) for weight management, an appetite suppressant administered as a weekly injection, titrated up every four weeks. Emphasized non-nutritional stress management techniques. - Restart metformin  at a lower dose with breakfast to minimize gastrointestinal side effects. - Prescribe Wegovy  (semaglutide ) starting at 0.25 mg weekly, titrating up every four weeks, pending insurance approval. - Educate on non-nutritional stress management techniques such as walking, deep breathing exercises, and engaging in non-food-related activities. - Schedule follow-up appointment in three weeks to monitor progress and adjust treatment as necessary.  Prediabetes Tracy Banks experienced gastrointestinal upset, including nausea, when taking metformin . Intolerance may be exacerbated by dietary changes, particularly increased carbohydrate and fat intake. Discussed restarting metformin  at a lower dose with food to improve tolerance. - Restart metformin  at a lower dose with food to improve tolerance. - Monitor for gastrointestinal side effects and adjust dosage as needed.  Suspected  sleep apnea Preliminary feedback from a sleep study suggests no sleep apnea, contradicting her subjective experience of restless sleep and frequent movements. Awaiting final interpretation of the sleep study results. - Await final interpretation of the sleep study results.  General Health Maintenance None - Encourage healthy eating habits and  regular physical activity. - Provide anticipatory guidance on managing stress without resorting to food.  Follow-up - Follow up in three weeks to evaluate weight management progress and medication tolerance.        Objective   Physical Exam:  Blood pressure 134/82, pulse 72, temperature 97.6 F (36.4 C), height 5' 9 (1.753 m), weight (!) 364 lb (165.1 kg), last menstrual period 12/25/2022, SpO2 96%. Body mass index is 53.75 kg/m.  General: She is overweight, cooperative, alert, well developed, and in no acute distress. PSYCH: Has normal mood, affect and thought process.   HEENT: EOMI, sclerae are anicteric. Lungs: Normal breathing effort, no conversational dyspnea. Extremities: No edema.  Neurologic: No gross sensory or motor deficits. No tremors or fasciculations noted.    Diagnostic Data Reviewed:  BMET    Component Value Date/Time   NA 140 01/12/2023 1301   NA 144 06/20/2022 1454   K 3.6 01/12/2023 1301   CL 104 01/12/2023 1301   CO2 27 01/12/2023 1301   GLUCOSE 88 01/12/2023 1301   BUN 11 01/12/2023 1301   BUN 10 06/20/2022 1454   CREATININE 1.08 (H) 01/12/2023 1301   CALCIUM 8.8 (L) 01/12/2023 1301   GFRNONAA >60 01/12/2023 1301   GFRAA 76 06/11/2019 1024   Lab Results  Component Value Date   HGBA1C 5.7 (H) 06/20/2022   HGBA1C 5.3 06/11/2019   Lab Results  Component Value Date   INSULIN  46.4 (H) 06/20/2022   Lab Results  Component Value Date   TSH 1.500 06/20/2022   CBC    Component Value Date/Time   WBC 7.8 02/11/2023 1247   WBC 7.4 01/12/2023 1301   RBC 4.06 02/11/2023 1247   RBC 4.03 02/11/2023 1246   HGB 8.6 (L) 02/11/2023 1247   HGB 11.4 06/20/2022 1454   HCT 29.3 (L) 02/11/2023 1247   HCT 37.4 06/20/2022 1454   PLT 211 02/11/2023 1247   PLT 279 06/20/2022 1454   MCV 72.2 (L) 02/11/2023 1247   MCV 74 (L) 06/20/2022 1454   MCH 21.2 (L) 02/11/2023 1247   MCHC 29.4 (L) 02/11/2023 1247   RDW 18.7 (H) 02/11/2023 1247   RDW 17.3 (H)  06/20/2022 1454   Iron  Studies    Component Value Date/Time   IRON  15 (L) 02/11/2023 1247   TIBC 458 (H) 02/11/2023 1247   FERRITIN 9 (L) 02/11/2023 1247   IRONPCTSAT 3 (L) 02/11/2023 1247   Lipid Panel     Component Value Date/Time   CHOL 98 (L) 06/20/2022 1454   TRIG 97 06/20/2022 1454   HDL 45 06/20/2022 1454   LDLCALC 34 06/20/2022 1454   Hepatic Function Panel     Component Value Date/Time   PROT 6.5 01/12/2023 1301   PROT 7.9 06/20/2022 1454   ALBUMIN 2.9 (L) 01/12/2023 1301   ALBUMIN 4.1 06/20/2022 1454   AST 31 01/12/2023 1301   ALT 20 01/12/2023 1301   ALKPHOS 75 01/12/2023 1301   BILITOT 0.3 01/12/2023 1301   BILITOT 0.3 06/20/2022 1454      Component Value Date/Time   TSH 1.500 06/20/2022 1454   Nutritional Lab Results  Component Value Date   VD25OH 29.1 (L) 06/20/2022   VD25OH  10.0 (L) 07/08/2019    Follow-Up   Return in about 3 weeks (around 05/15/2023) for For Weight Mangement with Dr. [email or web address].[email or web address] She was informed of the importance of frequent follow up visits to maximize her success with intensive lifestyle modifications for her multiple health conditions.  Attestation Statement   Reviewed by clinician on day of visit: allergies, medications, problem list, medical history, surgical history, family history, social history, and previous encounter notes.     Lucas Francyne, MD

## 2023-04-24 NOTE — Telephone Encounter (Signed)
 Pt needs prior auth for [employer]

## 2023-04-29 ENCOUNTER — Ambulatory Visit: Payer: Medicare HMO | Admitting: Neurology

## 2023-04-29 ENCOUNTER — Telehealth (INDEPENDENT_AMBULATORY_CARE_PROVIDER_SITE_OTHER): Payer: Self-pay | Admitting: Internal Medicine

## 2023-04-29 ENCOUNTER — Encounter: Payer: Self-pay | Admitting: Neurology

## 2023-04-29 ENCOUNTER — Telehealth (INDEPENDENT_AMBULATORY_CARE_PROVIDER_SITE_OTHER): Payer: Self-pay

## 2023-04-29 NOTE — Telephone Encounter (Signed)
 Patient called in stating she was prescribed Wegovy  and the pharmacy told her it will need a prior authorization. Patient states the pharmacy told her they have faxed the information twice and this is her second time calling about this.

## 2023-04-29 NOTE — Telephone Encounter (Signed)
 PA for [facility] started.

## 2023-04-30 NOTE — Telephone Encounter (Signed)
PA denied.

## 2023-05-02 ENCOUNTER — Other Ambulatory Visit: Payer: Self-pay | Admitting: Neurology

## 2023-05-02 DIAGNOSIS — G43711 Chronic migraine without aura, intractable, with status migrainosus: Secondary | ICD-10-CM

## 2023-05-06 ENCOUNTER — Telehealth: Payer: Self-pay

## 2023-05-06 ENCOUNTER — Inpatient Hospital Stay: Payer: 59 | Attending: Nurse Practitioner

## 2023-05-06 ENCOUNTER — Inpatient Hospital Stay: Payer: 59 | Admitting: Nurse Practitioner

## 2023-05-06 NOTE — Telephone Encounter (Signed)
Called patient due to not showing up for her 1pm lab appointment. She stated she didn't know she had an appointment today. I told patient I would send a message to scheduling to have it rescheduled. Will no show appointment today.

## 2023-05-06 NOTE — Assessment & Plan Note (Deleted)
 She had normal CBC in 2021 and prior, developed mild anemia Hgb 11 range in 2023, and presents with symptomatic acute anemia hgb 8.5 with fatigue and ice pica -Further work up shows ferritin 9 and elevated TIBC c/w IDA (01/04/23) -She reports 2 yr h/o heavy menstrual periods lasting 3 days each month, except October, since stopping depo contraceptive 2 years ago. This is likely the source of her blood loss IDA -She recently mailed back a stool test, if positive she understands EGD/colonoscopy will be recommended to r/o GI bleeding source -She has multiple medical conditions and autoimmune condition which are controlled per patient.  She may have a component of anemia of chronic disease -Other work up shows slightly low folate, I recommend to start folic acid and a prescription was sent. Normal TSH and B12 -She has been on oral iron ("high dose" once/week) x3 weeks per PCP. Tolerating well.  -Repeat labs today show persistent IDA, hgb 8.6, microcytosis, low reticulocyte hemoglobin and elevated immature retic %. Iron panel shows she has not responded to oral iron thus far. Ferritin is pending -I recommend IV iron, the potential SEs, benefit, and risk including anaphylactic reaction. She agrees to proceed -Monoferric is not preferred, will proceed with venofer 500 mg x2 -Will check lab 4-6 weeks after IV iron, then periodically after that and arrange more IV iron as needed -She understands if anemia does not completely resolve with sufficient iron replacement, we will recommend additional workup -She is currently taking oral iron supplements.  She has received 2 IV infusions of Venofer 500 mg each. -has seen GI for further evaluation.  She is scheduled for EGD and colonoscopy on 06/20/2023.

## 2023-05-06 NOTE — Progress Notes (Deleted)
 Patient Care Team: Sharmon Revere, MD as PCP - General (Family Medicine) Merwyn Katos, DPM as Consulting Physician (Podiatry) Pollyann Samples, NP as Nurse Practitioner (Hematology and Oncology)  Clinic Day:  05/06/2023  Referring physician: No ref. provider found  ASSESSMENT & PLAN:   Assessment & Plan: Iron deficiency anemia due to chronic blood loss She had normal CBC in 2021 and prior, developed mild anemia Hgb 11 range in 2023, and presents with symptomatic acute anemia hgb 8.5 with fatigue and ice pica -Further work up shows ferritin 9 and elevated TIBC c/w IDA (01/04/23) -She reports 2 yr h/o heavy menstrual periods lasting 3 days each month, except October, since stopping depo contraceptive 2 years ago. This is likely the source of her blood loss IDA -She recently mailed back a stool test, if positive she understands EGD/colonoscopy will be recommended to r/o GI bleeding source -She has multiple medical conditions and autoimmune condition which are controlled per patient.  She may have a component of anemia of chronic disease -Other work up shows slightly low folate, I recommend to start folic acid and a prescription was sent. Normal TSH and B12 -She has been on oral iron ("high dose" once/week) x3 weeks per PCP. Tolerating well.  -Repeat labs today show persistent IDA, hgb 8.6, microcytosis, low reticulocyte hemoglobin and elevated immature retic %. Iron panel shows she has not responded to oral iron thus far. Ferritin is pending -I recommend IV iron, the potential SEs, benefit, and risk including anaphylactic reaction. She agrees to proceed -Monoferric is not preferred, will proceed with venofer 500 mg x2 -Will check lab 4-6 weeks after IV iron, then periodically after that and arrange more IV iron as needed -She understands if anemia does not completely resolve with sufficient iron replacement, we will recommend additional workup -She is currently taking oral iron supplements.   She has received 2 IV infusions of Venofer 500 mg each. -has seen GI for further evaluation.  She is scheduled for EGD and colonoscopy on 06/20/2023.    The patient understands the plans discussed today and is in agreement with them.  She knows to contact our office if she develops concerns prior to her next appointment.  I provided *** minutes of face-to-face time during this encounter and > 50% was spent counseling as documented under my assessment and plan.    Carlean Jews, NP  [facility] - A DEPT OF [facility] [address] [place] [address] Dept: [phone] Dept Fax: [phone]   No orders of the defined types were placed in this encounter.     CHIEF COMPLAINT:  CC: Iron deficiency anemia  Current Treatment: IV Venofer 500 mg x 2  INTERVAL HISTORY:  Tracy Banks is here today for repeat clinical assessment.  Initial visit with Clayborn Heron, NP on 02/11/2023.  She did receive IV iron Venofer 500 mg x 2.  She has also seen GI provider due to her iron deficiency anemia.  It was recommended that she have EGD and colonoscopy for further evaluation.  These are scheduled for 06/20/2023.  She denies fevers or chills. She denies pain. Her appetite is good. Her weight {Weight change:10426}.  I have reviewed the past medical history, past surgical history, social history and family history with the patient and they are unchanged from previous note.  ALLERGIES:  is allergic to penicillins.  MEDICATIONS:  Current Outpatient Medications  Medication Sig Dispense Refill   Cholecalciferol (  VITAMIN D3) 50 MCG (2000 UT) capsule Take 1 capsule (2,000 Units total) by mouth daily.     doxycycline (VIBRAMYCIN) 100 MG capsule Take 1 capsule (100 mg total) by mouth 2 (two) times daily. 20 capsule 0   DULoxetine (CYMBALTA) 30 MG capsule Take 1 capsule (30 mg total) by mouth daily. 30 capsule 3   DULoxetine (CYMBALTA) 60 MG capsule TAKE  1 CAPSULE(60 MG) BY MOUTH DAILY 30 capsule 0   EMGALITY 120 MG/ML SOAJ Inject 140 mg into the skin every 30 (thirty) days. 1.12 mL 11   folic acid (FOLVITE) 1 MG tablet Take 1 tablet (1 mg total) by mouth daily. 30 tablet 3   HYDROcodone-acetaminophen (NORCO/VICODIN) 5-325 MG tablet Take 1-2 tablets by mouth every 6 (six) hours as needed.     hydroxychloroquine (PLAQUENIL) 200 MG tablet Take 200 mg by mouth 2 (two) times daily.     ibuprofen (ADVIL) 800 MG tablet Take 800 mg by mouth every 8 (eight) hours as needed.     metFORMIN (GLUCOPHAGE-XR) 500 MG 24 hr tablet Take 2 tablets (1,000 mg total) by mouth 2 (two) times daily with a meal. 120 tablet 1   nortriptyline (PAMELOR) 50 MG capsule TAKE 2 CAPSULES(100 MG) BY MOUTH AT BEDTIME. APPOINTMENT NEEDED FOR FURTHER REFILLS 60 capsule 0   nystatin (MYCOSTATIN) 100000 UNIT/ML suspension      omeprazole (PRILOSEC) 40 MG capsule Take 40 mg by mouth daily.     pregabalin (LYRICA) 150 MG capsule Take 1 capsule (150 mg total) by mouth in the morning, at noon, and at bedtime. 90 capsule 5   propranolol (INDERAL) 40 MG tablet TAKE 1 TABLET(40 MG) BY MOUTH TWICE DAILY 180 tablet 3   rosuvastatin (CRESTOR) 10 MG tablet Take 10 mg by mouth daily.     Semaglutide-Weight Management 0.25 MG/0.5ML SOAJ Inject 0.25 mg into the skin once a week for 28 days. 2 mL 0   tolterodine (DETROL LA) 4 MG 24 hr capsule Take 4 mg by mouth daily.     topiramate (TOPAMAX) 100 MG tablet One in am/ 2 at night 270 tablet 4   torsemide (DEMADEX) 20 MG tablet Take 20 mg by mouth daily.     traMADol (ULTRAM) 50 MG tablet Take 50 mg by mouth daily.     XTAMPZA ER 13.5 MG C12A Take 1 capsule by mouth 2 (two) times daily.     ziprasidone (GEODON) 20 MG capsule Take 20 mg by mouth See admin instructions. Take with 80 mg for a total of 100 mg daily     ziprasidone (GEODON) 80 MG capsule Take 80 mg by mouth See admin instructions. Take with 20 mg for a total of 100 mg daily     zolpidem  (AMBIEN) 5 MG tablet Take 1 tablet by mouth at bedtime as needed.     No current facility-administered medications for this visit.    REVIEW OF SYSTEMS:   Constitutional: Denies fevers, chills or abnormal weight loss Eyes: Denies blurriness of vision Ears, nose, mouth, throat, and face: Denies mucositis or sore throat Respiratory: Denies cough, dyspnea or wheezes Cardiovascular: Denies palpitation, chest discomfort or lower extremity swelling Gastrointestinal:  Denies nausea, heartburn or change in bowel habits Skin: Denies abnormal skin rashes Lymphatics: Denies new lymphadenopathy or easy bruising Neurological:Denies numbness, tingling or new weaknesses Behavioral/Psych: Mood is stable, no new changes  All other systems were reviewed with the patient and are negative.   VITALS:  Last menstrual period 12/25/2022.  Wt  Readings from Last 3 Encounters:  04/24/23 (!) 364 lb (165.1 kg)  04/03/23 (!) 361 lb (163.7 kg)  03/27/23 (!) 350 lb (158.8 kg)    There is no height or weight on file to calculate BMI.  Performance status (ECOG): {CHL ONC [id number]  PHYSICAL EXAM:   GENERAL:alert, no distress and comfortable SKIN: skin color, texture, turgor are normal, no rashes or significant lesions EYES: normal, Conjunctiva are pink and non-injected, sclera clear OROPHARYNX:no exudate, no erythema and lips, buccal mucosa, and tongue normal  NECK: supple, thyroid normal size, non-tender, without nodularity LYMPH:  no palpable lymphadenopathy in the cervical, axillary or inguinal LUNGS: clear to auscultation and percussion with normal breathing effort HEART: regular rate & rhythm and no murmurs and no lower extremity edema ABDOMEN:abdomen soft, non-tender and normal bowel sounds Musculoskeletal:no cyanosis of digits and no clubbing  NEURO: alert & oriented x 3 with fluent speech, no focal motor/sensory deficits  LABORATORY DATA:  I have reviewed the data as listed    Component  Value Date/Time   NA 140 01/12/2023 1301   NA 144 06/20/2022 1454   K 3.6 01/12/2023 1301   CL 104 01/12/2023 1301   CO2 27 01/12/2023 1301   GLUCOSE 88 01/12/2023 1301   BUN 11 01/12/2023 1301   BUN 10 06/20/2022 1454   CREATININE 1.08 (H) 01/12/2023 1301   CALCIUM 8.8 (L) 01/12/2023 1301   PROT 6.5 01/12/2023 1301   PROT 7.9 06/20/2022 1454   ALBUMIN 2.9 (L) 01/12/2023 1301   ALBUMIN 4.1 06/20/2022 1454   AST 31 01/12/2023 1301   ALT 20 01/12/2023 1301   ALKPHOS 75 01/12/2023 1301   BILITOT 0.3 01/12/2023 1301   BILITOT 0.3 06/20/2022 1454   GFRNONAA >60 01/12/2023 1301   GFRAA 76 06/11/2019 1024    No results found for: "SPEP", "UPEP"  Lab Results  Component Value Date   WBC 7.8 02/11/2023   NEUTROABS 3.4 02/11/2023   HGB 8.6 (L) 02/11/2023   HCT 29.3 (L) 02/11/2023   MCV 72.2 (L) 02/11/2023   PLT 211 02/11/2023      Chemistry      Component Value Date/Time   NA 140 01/12/2023 1301   NA 144 06/20/2022 1454   K 3.6 01/12/2023 1301   CL 104 01/12/2023 1301   CO2 27 01/12/2023 1301   BUN 11 01/12/2023 1301   BUN 10 06/20/2022 1454   CREATININE 1.08 (H) 01/12/2023 1301      Component Value Date/Time   CALCIUM 8.8 (L) 01/12/2023 1301   ALKPHOS 75 01/12/2023 1301   AST 31 01/12/2023 1301   ALT 20 01/12/2023 1301   BILITOT 0.3 01/12/2023 1301   BILITOT 0.3 06/20/2022 1454       RADIOGRAPHIC STUDIES: I have personally reviewed the radiological images as listed and agreed with the findings in the report. No results found.

## 2023-05-15 ENCOUNTER — Ambulatory Visit (INDEPENDENT_AMBULATORY_CARE_PROVIDER_SITE_OTHER): Payer: 59 | Admitting: Internal Medicine

## 2023-05-15 ENCOUNTER — Encounter (INDEPENDENT_AMBULATORY_CARE_PROVIDER_SITE_OTHER): Payer: Self-pay | Admitting: Internal Medicine

## 2023-05-15 DIAGNOSIS — R7303 Prediabetes: Secondary | ICD-10-CM

## 2023-05-15 DIAGNOSIS — R948 Abnormal results of function studies of other organs and systems: Secondary | ICD-10-CM

## 2023-05-15 DIAGNOSIS — Z6841 Body Mass Index (BMI) 40.0 and over, adult: Secondary | ICD-10-CM | POA: Diagnosis not present

## 2023-05-15 DIAGNOSIS — E66813 Obesity, class 3: Secondary | ICD-10-CM | POA: Diagnosis not present

## 2023-05-15 DIAGNOSIS — R635 Abnormal weight gain: Secondary | ICD-10-CM

## 2023-05-15 MED ORDER — METFORMIN HCL ER 500 MG PO TB24: 1000.0000 mg | ORAL_TABLET | Freq: Two times a day (BID) | ORAL | 1 refills | Status: DC

## 2023-05-15 MED ORDER — ZEPBOUND 2.5 MG/0.5ML ~~LOC~~ SOAJ
2.5000 mg | SUBCUTANEOUS | 0 refills | Status: DC
Start: 2023-05-15 — End: 2023-08-13

## 2023-05-15 NOTE — Assessment & Plan Note (Signed)
 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  Patient aware of disease state and risk of progression. This may contribute to abnormal cravings, fatigue and diabetic complications without having diabetes.   We have discussed treatment options which include: losing 7 to 10% of body weight, increasing physical activity to a goal of 150 minutes a week at moderate intensity.  Advised to maintain a diet low on simple and processed carbohydrates.  She is currently on metformin for pharmacoprophylaxis.  She will benefit from GLP-1 therapy.  Continue metformin start Zepbound 2.5 mg once a week

## 2023-05-15 NOTE — Progress Notes (Signed)
 Office: [phone]  /  Fax: [phone]  Weight Summary And Biometrics  Vitals Temp: 98 F (36.7 C) BP: (!) 83/65 Pulse Rate: 97 SpO2: 100 %   Anthropometric Measurements Height: 5\' 9"  (1.753 m) Weight: (!) 369 lb (167.4 kg) BMI (Calculated): 54.47 Weight at Last Visit: 364 lb Weight Lost Since Last Visit: 0 Weight Gained Since Last Visit: 5 lb Starting Weight: 379 lb Total Weight Loss (lbs): 10 lb (4.536 kg) Peak Weight: 435 lb   Body Composition  Body Fat %: 58.9 % Fat Mass (lbs): 217.4 lbs Muscle Mass (lbs): 144.2 lbs Visceral Fat Rating : 23    No data recorded Today's Visit #: 7  Starting Date: 06/20/22   Subjective   Chief Complaint: Obesity  Interval History Patient presents today for medical weight management.  Since last office visit she has gained 5 pounds she has been gaining weight over the last 3 months due to high levels of stress, financial insecurity and issues pertaining to workplace.  She reports a reduction in her hours.  She lives in a motel with her son her car is also broken down.  She has relapsed and has been indulging in highly palatable foods to deal with her stress.  She has not seen behavioral health.  She has not been following her reduced calorie nutrition plan  Challenges affecting patient progress: multiple competing priorities, cost of healthy foods, having difficulty preparing healthy meals, having difficulty with meal prep and planning, having difficulty focusing on healthy eating, low volume of physical activity at present , inadequate sleep, moderate to high levels of stress, and unstable housing and finances .    Pharmacotherapy for weight management: She is currently taking  had been prescribed Wegovy but it was denied by her insurance .   Assessment and Plan   Treatment Plan For Obesity:  Recommended Dietary Goals  Latica is currently in the action stage of change. As such, her goal is to continue weight management  plan. She has agreed to: continue to work on implementation of reduced calorie nutrition plan (RCNP)  Behavioral Health and Counseling  We discussed the following behavioral modification strategies today: decreasing eating out or consumption of processed foods, and making healthy choices when eating convenient foods, work on managing stress, creating time for self-care and relaxation, and avoiding temptations and identifying enticing environmental cues.  Additional education and resources provided today: None  Recommended Physical Activity Goals  Jacey has been advised to work up to 150 minutes of moderate intensity aerobic activity a week and strengthening exercises 2-3 times per week for cardiovascular health, weight loss maintenance and preservation of muscle mass.   She has agreed to :  Think about enjoyable ways to increase daily physical activity and overcoming barriers to exercise and Increase physical activity in their day and reduce sedentary time (increase NEAT).  Pharmacotherapy  We discussed various medication options to help [facility] with her weight loss efforts and we both agreed to : start anti-obesity medication.  In addition to reduced calorie nutrition plan (RCNP), behavioral strategies and physical activity, Stephana would benefit from pharmacotherapy to assist with hunger signals, satiety and cravings. This will reduce obesity-related health risks by inducing weight loss, and help reduce food consumption and adherence to [facility]) . It may also improve QOL by improving self-confidence and reduce the  setbacks associated with metabolic adaptations.  After discussion of treatment options, mechanisms of action, benefits, side effects, contraindications and shared decision making she is agreeable to starting  Zepbound 2.5 mg once a week. Patient also made aware that medication is indicated for long-term management of obesity and the risk of weight regain following discontinuation of  treatment and hence the importance of adhering to medical weight loss plan.  We demonstrated use of device and patient using teach back method was able to demonstrate proper technique.  We had prescribed Wegovy which was denied by her insurance patient states that representative said they would cover Zepbound.  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  Patient aware of disease state and risk of progression. This may contribute to abnormal cravings, fatigue and diabetic complications without having diabetes.   We have discussed treatment options which include: losing 7 to 10% of body weight, increasing physical activity to a goal of 150 minutes a week at moderate intensity.  Advised to maintain a diet low on simple and processed carbohydrates.  She is currently on metformin for pharmacoprophylaxis.  She will benefit from GLP-1 therapy.  Continue metformin start Zepbound 2.5 mg once a week   Orders: -     metFORMIN HCl ER; Take 2 tablets (1,000 mg total) by mouth 2 (two) times daily with a meal.  Dispense: 120 tablet; Refill: 1 -     Zepbound; Inject 2.5 mg into the skin once a week.  Dispense: 2 mL; Refill: 0  Class 3 severe obesity with serious comorbidity and body mass index (BMI) of 50.0 to 59.9 in adult, unspecified obesity type [facility]) Assessment & Plan: See obesity treatment plan  Orders: -     Zepbound; Inject 2.5 mg into the skin once a week.  Dispense: 2 mL; Refill: 0  Weight gain due to medication Assessment & Plan: Patient on multiple obesogenic medications.  We discussed how these medications may cause weight gain and affect her weight loss.  She is currently on metformin to help offset some of the weight gain.  We are increasing medication today to 1000 mg  twice daily.   Abnormal metabolism Assessment & Plan: Patient has a slower than predicted metabolism. IC 1886 vs. calculated 2465. This may contribute to weight gain, chronic fatigue and difficulty losing weight.  Likely secondary to low volume of physical activity, obesogenic medications, genetics and possibly untreated sleep apnea.  I cannot find official sleep study results patient states being told that there was no significant sleep apnea.  She still struggling with her sleep.  We reviewed measures to improve metabolism including not skipping meals, progressive strengthening exercises, increasing protein intake at every meal and maintaining adequate hydration and sleep.        Objective   Physical Exam:  Blood pressure (!) 83/65, pulse 97, temperature 98 F (36.7 C), height 5\' 9"  (1.753 m), weight (!) 369 lb (167.4 kg), last menstrual period 12/25/2022, SpO2 100%. Body mass index is 54.49 kg/m.  General: She is overweight, cooperative, alert, well developed, and in no acute distress. PSYCH: Has normal mood, affect and thought process.   HEENT: EOMI, sclerae are anicteric. Lungs: Normal breathing effort, no conversational dyspnea. Extremities: No edema.  Neurologic: No gross sensory or motor deficits. No tremors or fasciculations noted.    Diagnostic Data Reviewed:  BMET    Component Value Date/Time   NA 140 01/12/2023 1301   NA 144  06/20/2022 1454   K 3.6 01/12/2023 1301   CL 104 01/12/2023 1301   CO2 27 01/12/2023 1301   GLUCOSE 88 01/12/2023 1301   BUN 11 01/12/2023 1301   BUN 10 06/20/2022 1454   CREATININE 1.08 (H) 01/12/2023 1301   CALCIUM 8.8 (L) 01/12/2023 1301   GFRNONAA >60 01/12/2023 1301   GFRAA 76 06/11/2019 1024   Lab Results  Component Value Date   HGBA1C 5.7 (H) 06/20/2022   HGBA1C 5.3 06/11/2019   Lab Results  Component Value Date   INSULIN 46.4 (H) 06/20/2022   Lab Results  Component Value Date   TSH 1.500 06/20/2022   CBC     Component Value Date/Time   WBC 7.8 02/11/2023 1247   WBC 7.4 01/12/2023 1301   RBC 4.06 02/11/2023 1247   RBC 4.03 02/11/2023 1246   HGB 8.6 (L) 02/11/2023 1247   HGB 11.4 06/20/2022 1454   HCT 29.3 (L) 02/11/2023 1247   HCT 37.4 06/20/2022 1454   PLT 211 02/11/2023 1247   PLT 279 06/20/2022 1454   MCV 72.2 (L) 02/11/2023 1247   MCV 74 (L) 06/20/2022 1454   MCH 21.2 (L) 02/11/2023 1247   MCHC 29.4 (L) 02/11/2023 1247   RDW 18.7 (H) 02/11/2023 1247   RDW 17.3 (H) 06/20/2022 1454   Iron Studies    Component Value Date/Time   IRON 15 (L) 02/11/2023 1247   TIBC 458 (H) 02/11/2023 1247   FERRITIN 9 (L) 02/11/2023 1247   IRONPCTSAT 3 (L) 02/11/2023 1247   Lipid Panel     Component Value Date/Time   CHOL 98 (L) 06/20/2022 1454   TRIG 97 06/20/2022 1454   HDL 45 06/20/2022 1454   LDLCALC 34 06/20/2022 1454   Hepatic Function Panel     Component Value Date/Time   PROT 6.5 01/12/2023 1301   PROT 7.9 06/20/2022 1454   ALBUMIN 2.9 (L) 01/12/2023 1301   ALBUMIN 4.1 06/20/2022 1454   AST 31 01/12/2023 1301   ALT 20 01/12/2023 1301   ALKPHOS 75 01/12/2023 1301   BILITOT 0.3 01/12/2023 1301   BILITOT 0.3 06/20/2022 1454      Component Value Date/Time   TSH 1.500 06/20/2022 1454   Nutritional Lab Results  Component Value Date   VD25OH 29.1 (L) 06/20/2022   VD25OH 10.0 (L) 07/08/2019    Medications: Outpatient Encounter Medications as of 05/15/2023  Medication Sig Note   Cholecalciferol (VITAMIN D3) 50 MCG (2000 UT) capsule Take 1 capsule (2,000 Units total) by mouth daily.    doxycycline (VIBRAMYCIN) 100 MG capsule Take 1 capsule (100 mg total) by mouth 2 (two) times daily.    DULoxetine (CYMBALTA) 60 MG capsule TAKE 1 CAPSULE(60 MG) BY MOUTH DAILY    EMGALITY 120 MG/ML SOAJ Inject 140 mg into the skin every 30 (thirty) days.    folic acid (FOLVITE) 1 MG tablet Take 1 tablet (1 mg total) by mouth daily.    HYDROcodone-acetaminophen (NORCO/VICODIN) 5-325 MG tablet  Take 1-2 tablets by mouth every 6 (six) hours as needed.    hydroxychloroquine (PLAQUENIL) 200 MG tablet Take 200 mg by mouth 2 (two) times daily.    ibuprofen (ADVIL) 800 MG tablet Take 800 mg by mouth every 8 (eight) hours as needed.    nortriptyline (PAMELOR) 50 MG capsule TAKE 2 CAPSULES(100 MG) BY MOUTH AT BEDTIME. APPOINTMENT NEEDED FOR FURTHER REFILLS    nystatin (MYCOSTATIN) 100000 UNIT/ML suspension     omeprazole (PRILOSEC) 40 MG capsule Take 40 mg  by mouth daily.    pregabalin (LYRICA) 150 MG capsule Take 1 capsule (150 mg total) by mouth in the morning, at noon, and at bedtime.    propranolol (INDERAL) 40 MG tablet TAKE 1 TABLET(40 MG) BY MOUTH TWICE DAILY    rosuvastatin (CRESTOR) 10 MG tablet Take 10 mg by mouth daily.    tirzepatide (ZEPBOUND) 2.5 MG/0.5ML Pen Inject 2.5 mg into the skin once a week.    tolterodine (DETROL LA) 4 MG 24 hr capsule Take 4 mg by mouth daily.    topiramate (TOPAMAX) 100 MG tablet One in am/ 2 at night    torsemide (DEMADEX) 20 MG tablet Take 20 mg by mouth daily.    traMADol (ULTRAM) 50 MG tablet Take 50 mg by mouth daily. 01/18/2022: Not [facility]   XTAMPZA ER 13.5 MG C12A Take 1 capsule by mouth 2 (two) times daily.    ziprasidone (GEODON) 20 MG capsule Take 20 mg by mouth See admin instructions. Take with 80 mg for a total of 100 mg daily    ziprasidone (GEODON) 80 MG capsule Take 80 mg by mouth See admin instructions. Take with 20 mg for a total of 100 mg daily    zolpidem (AMBIEN) 5 MG tablet Take 1 tablet by mouth at bedtime as needed.    [DISCONTINUED] metFORMIN (GLUCOPHAGE-XR) 500 MG 24 hr tablet Take 2 tablets (1,000 mg total) by mouth 2 (two) times daily with a meal.    [DISCONTINUED] Semaglutide-Weight Management 0.25 MG/0.5ML SOAJ Inject 0.25 mg into the skin once a week for 28 days.    DULoxetine (CYMBALTA) 30 MG capsule Take 1 capsule (30 mg total) by mouth daily.    metFORMIN (GLUCOPHAGE-XR) 500 MG 24 hr tablet Take 2 tablets (1,000 mg total)  by mouth 2 (two) times daily with a meal.    No facility-administered encounter medications on file as of 05/15/2023.     Follow-Up   Return in about 4 weeks (around 06/12/2023) for 40 minutes, complex obesity.. She was informed of the importance of frequent follow up visits to maximize her success with intensive lifestyle modifications for her multiple health conditions.  Attestation Statement   Reviewed by clinician on day of visit: allergies, medications, problem list, medical history, surgical history, family history, social history, and previous encounter notes.     Worthy Rancher, MD

## 2023-05-15 NOTE — Assessment & Plan Note (Signed)
 Patient on multiple obesogenic medications.  We discussed how these medications may cause weight gain and affect her weight loss.  She is currently on metformin to help offset some of the weight gain.  We are increasing medication today to 1000 mg twice daily.

## 2023-05-15 NOTE — Assessment & Plan Note (Signed)
 See obesity treatment plan

## 2023-05-15 NOTE — Assessment & Plan Note (Signed)
 Patient has a slower than predicted metabolism. IC 1886 vs. calculated 2465. This may contribute to weight gain, chronic fatigue and difficulty losing weight.  Likely secondary to low volume of physical activity, obesogenic medications, genetics and possibly untreated sleep apnea.  I cannot find official sleep study results patient states being told that there was no significant sleep apnea.  She still struggling with her sleep.  We reviewed measures to improve metabolism including not skipping meals, progressive strengthening exercises, increasing protein intake at every meal and maintaining adequate hydration and sleep.

## 2023-06-05 ENCOUNTER — Telehealth (INDEPENDENT_AMBULATORY_CARE_PROVIDER_SITE_OTHER): Payer: Self-pay | Admitting: Internal Medicine

## 2023-06-05 ENCOUNTER — Telehealth: Payer: Self-pay | Admitting: Neurology

## 2023-06-05 ENCOUNTER — Telehealth: Payer: Self-pay | Admitting: Pediatrics

## 2023-06-05 NOTE — Telephone Encounter (Signed)
 Good afternoon!  Patient called to inquire about the status of the pre auth. She said she hasn't anything about it since her last appointment.   Thank you!

## 2023-06-05 NOTE — Telephone Encounter (Signed)
 Maya, sending this to you since RX was sent while in clinic. Thanks

## 2023-06-05 NOTE — Telephone Encounter (Signed)
 Patient called and stated that [employer] is charging her for her prep medication and was wondering if there was a way to get her prep sent over to the pharmacy in order for her insurance to pay for it. Patient is requesting a call back. Please advise.

## 2023-06-05 NOTE — Telephone Encounter (Signed)
 MYC confirmation

## 2023-06-05 NOTE — Telephone Encounter (Signed)
 Patient is coming by the office to pick up a sample of SUTAB tomorrow.

## 2023-06-06 ENCOUNTER — Telehealth: Payer: Self-pay

## 2023-06-06 ENCOUNTER — Ambulatory Visit: Payer: Medicare HMO | Admitting: Neurology

## 2023-06-06 ENCOUNTER — Other Ambulatory Visit (INDEPENDENT_AMBULATORY_CARE_PROVIDER_SITE_OTHER): Payer: Self-pay | Admitting: Internal Medicine

## 2023-06-06 ENCOUNTER — Other Ambulatory Visit (HOSPITAL_COMMUNITY): Payer: Self-pay

## 2023-06-06 ENCOUNTER — Encounter (INDEPENDENT_AMBULATORY_CARE_PROVIDER_SITE_OTHER): Payer: Self-pay

## 2023-06-06 ENCOUNTER — Encounter: Payer: Self-pay | Admitting: Neurology

## 2023-06-06 VITALS — Ht 69.0 in | Wt 365.0 lb

## 2023-06-06 DIAGNOSIS — G44221 Chronic tension-type headache, intractable: Secondary | ICD-10-CM

## 2023-06-06 DIAGNOSIS — G4710 Hypersomnia, unspecified: Secondary | ICD-10-CM | POA: Diagnosis not present

## 2023-06-06 DIAGNOSIS — G43711 Chronic migraine without aura, intractable, with status migrainosus: Secondary | ICD-10-CM

## 2023-06-06 DIAGNOSIS — R7303 Prediabetes: Secondary | ICD-10-CM

## 2023-06-06 MED ORDER — SUMATRIPTAN SUCCINATE 100 MG PO TABS: 100.0000 mg | ORAL_TABLET | ORAL | 11 refills | Status: AC | PRN

## 2023-06-06 MED ORDER — PROPRANOLOL HCL 40 MG PO TABS: 40.0000 mg | ORAL_TABLET | Freq: Two times a day (BID) | ORAL | 3 refills | Status: AC

## 2023-06-06 MED ORDER — NURTEC 75 MG PO TBDP: ORAL_TABLET | ORAL | 11 refills | Status: AC

## 2023-06-06 MED ORDER — EMGALITY 120 MG/ML ~~LOC~~ SOAJ: 120.0000 mg | SUBCUTANEOUS | 11 refills | Status: AC

## 2023-06-06 MED ORDER — NORTRIPTYLINE HCL 25 MG PO CAPS: 25.0000 mg | ORAL_CAPSULE | Freq: Every day | ORAL | 11 refills | Status: DC

## 2023-06-06 MED ORDER — BUPIVACAINE HCL (PF) 0.5 % IJ SOLN
10.0000 mL | Freq: Once | INTRAMUSCULAR | Status: AC
  Administered 2023-06-06: 10 mL

## 2023-06-06 MED ORDER — NORTRIPTYLINE HCL 50 MG PO CAPS: 100.0000 mg | ORAL_CAPSULE | Freq: Every day | ORAL | 11 refills | Status: AC

## 2023-06-06 NOTE — Progress Notes (Signed)
   History:  Intractable migraine for 2 days failed home remedy    Bilateral occipital and trigeminal nerve block; trigger point injection of bilateral cervical and upper trapezius muscles for intractable headache  Bupivacaine 0.5% was injected on the scalp bilaterally at several locations:  -On the occipital area of the head, 3 injections each side, 0.5 cc per injection at the midpoint between the mastoid process and the occipital protuberance. 2 other injections were done one finger breadth from the initial injection, one at a 10 o'clock position and the other at a 2 o'clock position.  -2 injections of 0.5 cc were done in the temporal regions, 2 fingerbreadths above the tragus of the ear, with the second injection one fingerbreadth posteriorly to the first.  -2 injections were done on the brow, 1 in the medial brow and one over the supraorbital nerve notch, with 0.1 cc for each injection  -1 injection each side of 0.5 cc was done anterior to the tragus of the ear for a trigeminal ganglion injection  -0.5 cc was injected into bilateral upper trapezius and bilateral upper cervical paraspinals   The patient tolerated the injections well, no complications of the procedure were noted. Injections were made with a 27-gauge needle.

## 2023-06-06 NOTE — Patient Instructions (Signed)
 Meds ordered this encounter  Medications   EMGALITY 120 MG/ML SOAJ    Sig: Inject 120 mg into the skin every 30 (thirty) days.    Dispense:  1.12 mL    Refill:  11   nortriptyline (PAMELOR) 50 MG capsule    Sig: Take 2 capsules (100 mg total) by mouth at bedtime.    Dispense:  60 capsule    Refill:  11    Please discard previous 25mg  Rx.   Rimegepant Sulfate (NURTEC) 75 MG TBDP    Sig: Take 1 tab at onset of migraine.  May repeat in 2 hrs, if needed.  Max dose: 2 tabs/day. This is a 30 day prescription.    Dispense:  16 tablet    Refill:  11   propranolol (INDERAL) 40 MG tablet    Sig: Take 1 tablet (40 mg total) by mouth 2 (two) times daily.    Dispense:  180 tablet    Refill:  3

## 2023-06-06 NOTE — Progress Notes (Signed)
 ASSESSMENT AND PLAN 43 y.o. year old female   Chronic migraine  Refill her preventive medication Emgality, Inderal 40 twice a day, nortriptyline 50 mg 2 tablets every night,  Trigger point injection for severe prolonged headaches today  Imitrex as needed  Nurtec as needed, reported suboptimal response to triptan in the past  Data review  Addendum 10/17/21 SS: Had ophthalmology visit with Dr. Dione Booze 10/12/2021, no evidence of papilledema or increased ICP on exam, recommended weight loss and follow-up with neurology.  On hydroxychloroquine no toxicity or maculopathy noted.  HISTORY  Tracy Banks Is a 43 years old female, seen in request by her primary care nurse practitioner Maryagnes Amos, for evaluation of migraine headache,   I have reviewed and summarized the referring note from the referring physician.  She reported a history of migraine headaches since high school, but her migraine are lateralized severe pounding headache with associated light noise sensitivity, lasting for hours, going to sleep usually helps, she used to have migraine 2-3 times each month, trigger for her migraines are light, noise, smell from freshly cut grass, stress, sleep deprivation, weather changes, her son, mother also suffered migraines,   She also has history of obesity, has been on blood pressures for over past 10 years, her weight is overall stable, there was no significant visual changes.   She complains of slow worsening migraine headaches over the past couple years, has been getting injection as a urgent abortive treatment at her primary care's office every 2 to 4 weeks, Imitrex provide limited help, Phenergan, sleep is usually helpful, she has been taking over-the-counter medication, in combination with Imitrex almost every other day to control her headache over the past few months.  She also had visual auras preceding her migraine, blurry vision, distortion of the colors,  Preventive Medication  for migraine,  Inderal 40 mg twice a day, Emgality, nortriptyline 50 mg 2 tablets every night has been helpful  Abortive treatment Relpax, Zomig, Maxalt did not help,    She was seen by rheumatologist Dr. Cheryll Cockayne on October 22, 2019, did have some sun sensitive skin rash, antiphospholipid antibody, elevated ESR, low positive double-stranded DNA with all the other labs for lupus being negative Positive p-ANCA but all other labs for vasculitis was negative Slight elevated CK of unknown clinical significance   She was diagnosed with connective tissue disorder, was started on Plaquenil, which did help her symptoms some, Also had a skin biopsy in May 2021,  with diagnosis of small fiber neuropathy,     UPDATE June 06 2023: She lost follow-up since last visit in 2023, Emgality monthly was helpful, but ran out of the prescription,  She has daily variable degree of headache, run out of her abortive treatment for migraine, came in with 2 days severe migraine light noise sensitivity, failed over-the-counter medications,   Pending ophthalmology evaluation soon  PHYSICAL EXAMNIATION:    06/06/2023    3:11 PM 05/15/2023    3:00 PM 04/24/2023   10:00 AM  Vitals with BMI  Height 5\' 9"  5\' 9"  5\' 9"   Weight 365 lbs 369 lbs 364 lbs  BMI 53.88 54.47 53.73  Systolic  83 134  Diastolic  65 82  Pulse  97 72     Gen: Obese middle-age female, NAD, conversant, well nourised, well groomed                     Cardiovascular: Regular rate rhythm, no peripheral edema, warm, nontender. Eyes:  Conjunctivae clear without exudates or hemorrhage Neck: Supple, no carotid bruits. Pulmonary: Clear to auscultation bilaterally   NEUROLOGICAL EXAM:  MENTAL STATUS: Speech/cognition: Mild distress, light sensitivity, awake, alert oriented to history taking and casual conversation  CRANIAL NERVES: CN II: Visual fields are full to confrontation.  Pupils are round equal and briskly reactive to light. CN III,  IV, VI: extraocular movement are normal. No ptosis. CN V: Facial sensation is intact to pinprick in all 3 divisions bilaterally. Corneal responses are intact.  CN VII: Face is symmetric with normal eye closure and smile. CN VIII: Hearing is normal to casual conversation CN IX, X: Palate elevates symmetrically. Phonation is normal. CN XI: Head turning and shoulder shrug are intact CN XII: Tongue is midline with normal movements and no atrophy.  MOTOR: There is no pronator drift of out-stretched arms. Muscle bulk and tone are normal. Muscle strength is normal.  REFLEXES: Reflexes are 1 and symmetric at the biceps, triceps, knees, and ankles. Plantar responses are flexor.  SENSORY: Intact to light touch,    COORDINATION: Rapid alternating movements and fine finger movements are intact. There is no dysmetria on finger-to-nose and heel-knee-shin.    GAIT/STANCE: Push-up to get up from seated position, limited by her weight   REVIEW OF SYSTEMS: Out of a complete 14 system review of symptoms, the patient complains only of the following symptoms, and all other reviewed systems are negative.  See HPI  ALLERGIES: Allergies  Allergen Reactions   Penicillins Palpitations    Rapid heart beat Has patient had a PCN reaction causing immediate rash, facial/tongue/throat swelling, SOB or lightheadedness with hypotension: no Has patient had a PCN reaction causing severe rash involving mucus membranes or skin necrosis: no Has patient had a PCN reaction that required hospitalization unknown Has patient had a PCN reaction occurring within the last 10 years: no If all of the above answers are "NO", then may proceed with Cephalosporin use.  Rapid heart beat Has patient had a PCN reaction causing immediate rash, facial/tongue/throat swelling, SOB or lightheadedness with hypotension: no Has patient had a PCN reaction causing severe rash involving mucus membranes or skin necrosis: no Has patient had a  PCN reaction that required hospitalization unknown Has patient had a PCN reaction occurring within the last 10 years: no If all of the above answers are "NO", then may proceed with Cephalosporin use. Rapid heart beat Has patient had a PCN reaction causing immediate rash, facial/tongue/throat swelling, SOB or lightheadedness with hypotension: no Has patient had a PCN reaction causing severe rash involving mucus membranes or skin necrosis: no Has patient had a PCN reaction that required hospitalization unknown Has patient had a PCN reaction occurring within the last 10 years: no If all of the above answers are "NO", then may proceed with Cephalosporin use. Other reaction(s): Tachycardia    HOME MEDICATIONS: Outpatient Medications Prior to Visit  Medication Sig Dispense Refill   Cholecalciferol (VITAMIN D3) 50 MCG (2000 UT) capsule Take 1 capsule (2,000 Units total) by mouth daily.     doxycycline (VIBRAMYCIN) 100 MG capsule Take 1 capsule (100 mg total) by mouth 2 (two) times daily. 20 capsule 0   DULoxetine (CYMBALTA) 60 MG capsule TAKE 1 CAPSULE(60 MG) BY MOUTH DAILY 30 capsule 0   EMGALITY 120 MG/ML SOAJ Inject 140 mg into the skin every 30 (thirty) days. 1.12 mL 11   folic acid (FOLVITE) 1 MG tablet Take 1 tablet (1 mg total) by mouth daily. 30 tablet 3  HYDROcodone-acetaminophen (NORCO/VICODIN) 5-325 MG tablet Take 1-2 tablets by mouth every 6 (six) hours as needed.     hydroxychloroquine (PLAQUENIL) 200 MG tablet Take 200 mg by mouth 2 (two) times daily.     ibuprofen (ADVIL) 800 MG tablet Take 800 mg by mouth every 8 (eight) hours as needed.     metFORMIN (GLUCOPHAGE-XR) 500 MG 24 hr tablet Take 2 tablets (1,000 mg total) by mouth 2 (two) times daily with a meal. 120 tablet 1   nortriptyline (PAMELOR) 50 MG capsule TAKE 2 CAPSULES(100 MG) BY MOUTH AT BEDTIME. APPOINTMENT NEEDED FOR FURTHER REFILLS 60 capsule 0   nystatin (MYCOSTATIN) 100000 UNIT/ML suspension      omeprazole  (PRILOSEC) 40 MG capsule Take 40 mg by mouth daily.     pregabalin (LYRICA) 150 MG capsule Take 1 capsule (150 mg total) by mouth in the morning, at noon, and at bedtime. 90 capsule 5   propranolol (INDERAL) 40 MG tablet TAKE 1 TABLET(40 MG) BY MOUTH TWICE DAILY 180 tablet 3   rosuvastatin (CRESTOR) 10 MG tablet Take 10 mg by mouth daily.     tirzepatide (ZEPBOUND) 2.5 MG/0.5ML Pen Inject 2.5 mg into the skin once a week. 2 mL 0   tolterodine (DETROL LA) 4 MG 24 hr capsule Take 4 mg by mouth daily.     topiramate (TOPAMAX) 100 MG tablet One in am/ 2 at night 270 tablet 4   torsemide (DEMADEX) 20 MG tablet Take 20 mg by mouth daily.     traMADol (ULTRAM) 50 MG tablet Take 50 mg by mouth daily.     XTAMPZA ER 13.5 MG C12A Take 1 capsule by mouth 2 (two) times daily.     ziprasidone (GEODON) 20 MG capsule Take 20 mg by mouth See admin instructions. Take with 80 mg for a total of 100 mg daily     ziprasidone (GEODON) 80 MG capsule Take 80 mg by mouth See admin instructions. Take with 20 mg for a total of 100 mg daily     zolpidem (AMBIEN) 5 MG tablet Take 1 tablet by mouth at bedtime as needed.     DULoxetine (CYMBALTA) 30 MG capsule Take 1 capsule (30 mg total) by mouth daily. 30 capsule 3   No facility-administered medications prior to visit.    PAST MEDICAL HISTORY: Past Medical History:  Diagnosis Date   Acid reflux    Anemia    Anxiety    Arthritis    Asthma    Back pain    Carpal tunnel syndrome    Depression    Eczema    Fibromyalgia    Gout    History of swelling of feet    Hyperlipidemia    Hypertension    Incontinence    Lupus    Migraine    Osteoarthritis    Overactive bladder    Pneumonia    Polyarthralgia    Vitamin D deficiency     PAST SURGICAL HISTORY: Past Surgical History:  Procedure Laterality Date   ANKLE SURGERY Bilateral    CHONDROPLASTY Right 01/04/2022   Procedure: KNEE ARTHROSCOPY CHONDROPLASTY;  Surgeon: Eugenia Mcalpine, MD;  Location: [facility];   Service: Orthopedics;  Laterality: Right;  knee block  60   KNEE ARTHROSCOPY WITH LATERAL MENISECTOMY Right 01/04/2022   Procedure: KNEE ARTHROSCOPY WITH PARTIAL LATERAL MENISECTOMY;  Surgeon: Eugenia Mcalpine, MD;  Location: [facility];  Service: Orthopedics;  Laterality: Right;  knee block 60   LEG SURGERY Right  Muscle stretched   NEUROMA SURGERY      FAMILY HISTORY: Family History  Problem Relation Age of Onset   Sarcoidosis Mother    Migraines Mother    Hypertension Mother    Osteoarthritis Mother    Diabetes Mother    Fibromyalgia Mother    Depression Mother    Sleep apnea Mother    Obesity Mother    Healthy Father    Liver disease Neg Hx    Esophageal cancer Neg Hx    Colon cancer Neg Hx     SOCIAL HISTORY: Social History   Socioeconomic History   Marital status: Single    Spouse name: stay at home   Number of children: 3   Years of education: college   Highest education level: Not on file  Occupational History   Occupation: unempolyed  Tobacco Use   Smoking status: Every Day    Current packs/day: 0.25    Average packs/day: 0.3 packs/day for 2.0 years (0.5 ttl pk-yrs)    Types: Cigarettes   Smokeless tobacco: Never   Tobacco comments:    1 pack per week  Vaping Use   Vaping status: Never Used  Substance and Sexual Activity   Alcohol use: Yes    Comment: occ   Drug use: No   Sexual activity: Not on file  Other Topics Concern   Not on file  Social History Narrative   Lives at home with children.   Right-handed.   Caffeine use:  One can soda per day.   Social Drivers of Corporate investment banker Strain: Not on file  Food Insecurity: Not on file  Transportation Needs: Not on file  Physical Activity: Not on file  Stress: Not on file  Social Connections: Unknown (07/24/2021)   Received [facility]   Social Network    Social Network: Not on file  Intimate Partner Violence: Unknown (06/22/2021)   Received from [facility]   HITS    Physically Hurt: Not on file    Insult or Talk Down To: Not on file    Threaten Physical Harm: Not on file    Scream or Curse: Not on file    PHYSICAL EXAM  Vitals:   06/06/23 1511  Weight: (!) 365 lb (165.6 kg)  Height: 5\' 9"  (1.753 m)   Body mass index is 53.9 kg/m.  Generalized: Well developed, in no acute distress, obese Neurological examination  Mentation: Alert oriented to time, place, history taking. Follows all commands speech and language fluent Cranial nerve II-XII: Pupils were equal round reactive to light. Extraocular movements were full, visual field were full on confrontational test. Facial sensation and strength were normal. Head turning and shoulder shrug were normal and symmetric. Motor: Good strength all extremities, with grip strength, poor effort, positive tinel's sign bilaterally  Sensory: Sensory testing is intact to soft touch on all 4 extremities. No evidence of extinction is noted.  Coordination: Cerebellar testing reveals good finger-nose-finger and heel-to-shin bilaterally.  Gait and station: Gait is steady, limited by large body habitus Reflexes: Deep tendon reflexes are symmetric but decreased  DIAGNOSTIC DATA (LABS, IMAGING, TESTING) - I reviewed patient records, labs, notes, testing and imaging myself where available.  Lab Results  Component Value Date   WBC 7.8 02/11/2023   HGB 8.6 (L) 02/11/2023   HCT 29.3 (L) 02/11/2023   MCV 72.2 (L) 02/11/2023   PLT 211 02/11/2023      Component Value Date/Time  NA 140 01/12/2023 1301   NA 144 06/20/2022 1454   K 3.6 01/12/2023 1301   CL 104 01/12/2023 1301   CO2 27 01/12/2023 1301   GLUCOSE 88 01/12/2023 1301   BUN 11 01/12/2023 1301   BUN 10 06/20/2022 1454   CREATININE 1.08 (H) 01/12/2023 1301   CALCIUM 8.8 (L) 01/12/2023 1301   PROT 6.5 01/12/2023 1301   PROT 7.9 06/20/2022 1454   ALBUMIN 2.9 (L) 01/12/2023 1301   ALBUMIN 4.1 06/20/2022 1454   AST 31 01/12/2023  1301   ALT 20 01/12/2023 1301   ALKPHOS 75 01/12/2023 1301   BILITOT 0.3 01/12/2023 1301   BILITOT 0.3 06/20/2022 1454   GFRNONAA >60 01/12/2023 1301   GFRAA 76 06/11/2019 1024   Lab Results  Component Value Date   CHOL 98 (L) 06/20/2022   HDL 45 06/20/2022   LDLCALC 34 06/20/2022   TRIG 97 06/20/2022   Lab Results  Component Value Date   HGBA1C 5.7 (H) 06/20/2022   Lab Results  Component Value Date   VITAMINB12 803 06/20/2022   Lab Results  Component Value Date   TSH 1.500 06/20/2022    Levert Feinstein. M.D. Ph.D.

## 2023-06-06 NOTE — Telephone Encounter (Signed)
 I received a request to renew PA for Emgality-PT has not been seen since 2023 so there are no updated chart notes to send to insurance-PA will not be able to be submitted without the needed documentation to get an approval-please advise.

## 2023-06-06 NOTE — Addendum Note (Signed)
 Addended by: Levert Feinstein on: 06/06/2023 04:29 PM   Modules accepted: Orders

## 2023-06-06 NOTE — Telephone Encounter (Signed)
 Rx pa team please hold for 4 days until evaluated at upcoming appt on 06/10/23

## 2023-06-10 NOTE — Telephone Encounter (Signed)
 Pharmacy Patient Advocate Encounter   Received notification from Physician's Office that prior authorization for Emgality 120MG /ML auto-injectors (migraine) is required/requested.   Insurance verification completed.   The patient is insured through [facility] .   Per test claim: PA required; PA submitted to above mentioned insurance via CoverMyMeds Key/confirmation #/EOC [facility] Status is pending

## 2023-06-11 ENCOUNTER — Other Ambulatory Visit (HOSPITAL_COMMUNITY): Payer: Self-pay

## 2023-06-11 NOTE — Telephone Encounter (Signed)
 Pharmacy Patient Advocate Encounter  Received notification from [facility] that Prior Authorization for Emgality 120MG /ML auto-injectors (migraine) has been APPROVED from 06/10/2023 to 03/18/2024. Ran test claim, Copay is $0. This test claim was processed through [facility]- copay amounts may vary at other pharmacies due to pharmacy/plan contracts, or as the patient moves through the different stages of their insurance plan.   PA #/Case ID/Reference #: [id number]

## 2023-06-12 ENCOUNTER — Telehealth: Payer: Self-pay | Admitting: Gastroenterology

## 2023-06-12 NOTE — Telephone Encounter (Signed)
 Procedure:Colon/Endo Procedure date: 06/20/23 Procedure location: [facility] Arrival Time: 6:45 am Spoke with the patient Y/N: Yes Any prep concerns? No  Has the patient obtained the prep from the pharmacy ? Yes Do you have a care partner and transportation: Yes Any additional concerns? No

## 2023-06-13 ENCOUNTER — Encounter (HOSPITAL_COMMUNITY): Payer: Self-pay | Admitting: Pediatrics

## 2023-06-17 ENCOUNTER — Ambulatory Visit (HOSPITAL_COMMUNITY)
Admission: EM | Admit: 2023-06-17 | Discharge: 2023-06-17 | Disposition: A | Discharge status: Home / Self Care | Attending: Family Medicine | Admitting: Family Medicine

## 2023-06-17 ENCOUNTER — Encounter (HOSPITAL_COMMUNITY): Payer: Self-pay | Admitting: Emergency Medicine

## 2023-06-17 ENCOUNTER — Encounter (INDEPENDENT_AMBULATORY_CARE_PROVIDER_SITE_OTHER): Payer: Self-pay | Admitting: Internal Medicine

## 2023-06-17 ENCOUNTER — Ambulatory Visit (INDEPENDENT_AMBULATORY_CARE_PROVIDER_SITE_OTHER)

## 2023-06-17 ENCOUNTER — Ambulatory Visit (INDEPENDENT_AMBULATORY_CARE_PROVIDER_SITE_OTHER): Payer: 59 | Admitting: Internal Medicine

## 2023-06-17 VITALS — BP 128/84 | HR 65 | Temp 97.7°F | Ht 69.0 in | Wt 365.0 lb

## 2023-06-17 DIAGNOSIS — R051 Acute cough: Secondary | ICD-10-CM

## 2023-06-17 DIAGNOSIS — E66813 Obesity, class 3: Secondary | ICD-10-CM

## 2023-06-17 DIAGNOSIS — R948 Abnormal results of function studies of other organs and systems: Secondary | ICD-10-CM

## 2023-06-17 DIAGNOSIS — Z59819 Housing instability, housed unspecified: Secondary | ICD-10-CM

## 2023-06-17 DIAGNOSIS — T50905A Adverse effect of unspecified drugs, medicaments and biological substances, initial encounter: Secondary | ICD-10-CM

## 2023-06-17 DIAGNOSIS — J22 Unspecified acute lower respiratory infection: Secondary | ICD-10-CM

## 2023-06-17 DIAGNOSIS — R7303 Prediabetes: Secondary | ICD-10-CM | POA: Diagnosis not present

## 2023-06-17 DIAGNOSIS — Z6841 Body Mass Index (BMI) 40.0 and over, adult: Secondary | ICD-10-CM

## 2023-06-17 MED ORDER — SULFAMETHOXAZOLE-TRIMETHOPRIM 800-160 MG PO TABS
1.0000 | ORAL_TABLET | Freq: Two times a day (BID) | ORAL | 0 refills | Status: AC
Start: 2023-06-17 — End: 2023-06-24

## 2023-06-17 MED ORDER — PROMETHAZINE-DM 6.25-15 MG/5ML PO SYRP: 5.0000 mL | ORAL_SOLUTION | Freq: Three times a day (TID) | ORAL | 0 refills | Status: DC | PRN

## 2023-06-17 MED ORDER — ALBUTEROL SULFATE HFA 108 (90 BASE) MCG/ACT IN AERS
INHALATION_SPRAY | RESPIRATORY_TRACT | Status: AC
  Filled 2023-06-17: qty 6.7

## 2023-06-17 MED ORDER — ALBUTEROL SULFATE HFA 108 (90 BASE) MCG/ACT IN AERS
2.0000 | INHALATION_SPRAY | Freq: Once | RESPIRATORY_TRACT | Status: AC
  Administered 2023-06-17: 2 via RESPIRATORY_TRACT

## 2023-06-17 MED ORDER — PREDNISONE 20 MG PO TABS: 20.0000 mg | ORAL_TABLET | Freq: Two times a day (BID) | ORAL | 0 refills | Status: DC

## 2023-06-17 MED ORDER — ALBUTEROL SULFATE HFA 108 (90 BASE) MCG/ACT IN AERS: 2.0000 | INHALATION_SPRAY | RESPIRATORY_TRACT | Status: AC | PRN

## 2023-06-17 NOTE — Assessment & Plan Note (Signed)
 She has lost four pounds in the past month, a positive outcome. Due to housing circumstances, she is eating out more but is making healthier choices like salads. Emphasized the importance of reducing sugar intake to aid weight management, as sugar can increase hunger and calorie intake. - Continue dietary modifications focusing on whole foods and reducing sugar intake -Use AI to make better choices when eating out - Encourage increased water consumption to reduce calorie intake from sugary drinks - Follow up with insurance regarding Zepbound approval for weight management

## 2023-06-17 NOTE — ED Triage Notes (Signed)
 Pt had cough for week, reports sometimes productive but will swallow it. Reports abd and chest pains when coughs. Hasn't had medication for cough

## 2023-06-17 NOTE — ED Provider Notes (Signed)
 [facility]    CSN: [id number] Arrival date & time: 06/17/23  1500      History   Chief Complaint Chief Complaint  Patient presents with   Cough    HPI Tracy Banks is a 43 y.o. female.  Patient has a history of asthma, smoker presents today with a few weeks of cough which has worsened over the last week.  Patient has not had fever.  Patient endorses some chest tightness with coughing.  Cough is intermittently productive. Patient has not taken any medication for symptoms. Past Medical History:  Diagnosis Date   Acid reflux    Anemia    Anxiety    Arthritis    Asthma    Back pain    Carpal tunnel syndrome    Depression    Eczema    Fibromyalgia    Gout    History of swelling of feet    Hyperlipidemia    Hypertension    Incontinence    Lupus    Migraine    Osteoarthritis    Overactive bladder    Pneumonia    Polyarthralgia    Vitamin D deficiency     Patient Active Problem List   Diagnosis Date Noted   Iron deficiency anemia due to chronic blood loss 02/11/2023   Weight gain due to medication 12/25/2022   Abnormal metabolism 12/25/2022   Housing situation unstable 12/25/2022   Chronic diarrhea 12/25/2022   Vitamin D deficiency 12/25/2022   Prediabetes 12/25/2022   Primary hypertension 06/04/2022   Suspected sleep apnea 06/04/2022   Class 3 severe obesity with serious comorbidity and body mass index (BMI) of 50.0 to 59.9 in adult (HCC) 06/04/2022   Pure hypercholesterolemia 06/04/2022   Polypharmacy 06/04/2022   Excessive sleepiness 04/27/2021   Intractable chronic migraine without aura and with status migrainosus 05/23/2020   Blurry vision 05/23/2020   Paresthesia 06/11/2019   Chronic migraine 05/05/2018   Fibromyalgia 11/05/2016    Past Surgical History:  Procedure Laterality Date   ANKLE SURGERY Bilateral    CHONDROPLASTY Right 01/04/2022   Procedure: KNEE ARTHROSCOPY CHONDROPLASTY;  Surgeon: Eugenia Mcalpine, MD;  Location: [facility];   Service: Orthopedics;  Laterality: Right;  knee block  60   KNEE ARTHROSCOPY WITH LATERAL MENISECTOMY Right 01/04/2022   Procedure: KNEE ARTHROSCOPY WITH PARTIAL LATERAL MENISECTOMY;  Surgeon: Eugenia Mcalpine, MD;  Location: [facility];  Service: Orthopedics;  Laterality: Right;  knee block 60   LEG SURGERY Right    Muscle stretched   NEUROMA SURGERY      OB History     Gravida  4   Para  4   Term      Preterm      AB      Living         SAB      IAB      Ectopic      Multiple      Live Births               Home Medications    Prior to Admission medications   Medication Sig Start Date End Date Taking? Authorizing Provider  predniSONE (DELTASONE) 20 MG tablet Take 1 tablet (20 mg total) by mouth 2 (two) times daily with a meal. 06/17/23  Yes Bing Neighbors, NP  promethazine-dextromethorphan (PROMETHAZINE-DM) 6.25-15 MG/5ML syrup Take 5 mLs by mouth 3 (three) times daily as needed for cough. 06/17/23  Yes Bing Neighbors, NP  sulfamethoxazole-trimethoprim (BACTRIM DS) 800-160  MG tablet Take 1 tablet by mouth 2 (two) times daily for 7 days. 06/17/23 06/24/23 Yes Bing Neighbors, NP  albuterol (VENTOLIN HFA) 108 (90 Base) MCG/ACT inhaler Inhale 2 puffs into the lungs every 4 (four) hours as needed for wheezing or shortness of breath. 06/17/23  Yes Bing Neighbors, NP  Cholecalciferol (VITAMIN D3) 50 MCG (2000 UT) capsule Take 1 capsule (2,000 Units total) by mouth daily. 12/25/22   Worthy Rancher, MD  DULoxetine (CYMBALTA) 30 MG capsule Take 1 capsule (30 mg total) by mouth daily. 03/26/22 03/26/23  Fanny Dance, MD  DULoxetine (CYMBALTA) 60 MG capsule TAKE 1 CAPSULE(60 MG) BY MOUTH DAILY 11/03/22   Fanny Dance, MD  EMGALITY 120 MG/ML SOAJ Inject 120 mg into the skin every 30 (thirty) days. 06/06/23   Levert Feinstein, MD  folic acid (FOLVITE) 1 MG tablet Take 1 tablet (1 mg total) by mouth daily. 02/11/23   Pollyann Samples, NP  HYDROcodone-acetaminophen  (NORCO/VICODIN) 5-325 MG tablet Take 1-2 tablets by mouth every 6 (six) hours as needed. 04/04/22   [provider]  hydroxychloroquine (PLAQUENIL) 200 MG tablet Take 200 mg by mouth 2 (two) times daily.    [provider]  ibuprofen (ADVIL) 800 MG tablet Take 800 mg by mouth every 8 (eight) hours as needed. 12/12/22   [provider]  metFORMIN (GLUCOPHAGE-XR) 500 MG 24 hr tablet Take 2 tablets (1,000 mg total) by mouth 2 (two) times daily with a meal. 05/15/23   Worthy Rancher, MD  nortriptyline (PAMELOR) 50 MG capsule Take 2 capsules (100 mg total) by mouth at bedtime. 06/06/23   Levert Feinstein, MD  nystatin (MYCOSTATIN) 100000 UNIT/ML suspension     [provider]  omeprazole (PRILOSEC) 40 MG capsule Take 40 mg by mouth daily.    [provider]  pregabalin (LYRICA) 150 MG capsule Take 1 capsule (150 mg total) by mouth in the morning, at noon, and at bedtime. 04/04/22   Levert Feinstein, MD  propranolol (INDERAL) 40 MG tablet Take 1 tablet (40 mg total) by mouth 2 (two) times daily. 06/06/23   Levert Feinstein, MD  Rimegepant Sulfate (NURTEC) 75 MG TBDP Take 1 tab at onset of migraine.  May repeat in 2 hrs, if needed.  Max dose: 2 tabs/day. This is a 30 day prescription. 06/06/23   Levert Feinstein, MD  rosuvastatin (CRESTOR) 10 MG tablet Take 10 mg by mouth daily. 11/15/21   [provider]  SUMAtriptan (IMITREX) 100 MG tablet Take 1 tablet (100 mg total) by mouth every 2 (two) hours as needed for migraine. May repeat in 2 hours if headache persists or recurs. 06/06/23   Levert Feinstein, MD  tirzepatide (ZEPBOUND) 2.5 MG/0.5ML Pen Inject 2.5 mg into the skin once a week. 05/15/23   Worthy Rancher, MD  tolterodine (DETROL LA) 4 MG 24 hr capsule Take 4 mg by mouth daily. 02/05/21   [provider]  topiramate (TOPAMAX) 100 MG tablet One in am/ 2 at night 04/27/21   Levert Feinstein, MD  torsemide (DEMADEX) 20 MG tablet Take 20 mg by mouth daily.    [provider]  traMADol (ULTRAM) 50 MG tablet Take 50 mg by mouth daily.    [provider]  XTAMPZA ER 13.5 MG C12A Take 1 capsule by mouth 2 (two) times daily. 01/11/23   [provider]  ziprasidone (GEODON) 20 MG capsule Take 20 mg by mouth See admin instructions. Take with 80 mg for a total of  100 mg daily 04/03/21   [provider]  ziprasidone (GEODON) 80 MG capsule Take 80 mg by mouth See admin instructions. Take with 20 mg for a total of 100 mg daily 12/04/21   [provider]  zolpidem (AMBIEN) 5 MG tablet Take 1 tablet by mouth at bedtime as needed.    [provider]    Family History Family History  Problem Relation Age of Onset   Sarcoidosis Mother    Migraines Mother    Hypertension Mother    Osteoarthritis Mother    Diabetes Mother    Fibromyalgia Mother    Depression Mother    Sleep apnea Mother    Obesity Mother    Healthy Father    Liver disease Neg Hx    Esophageal cancer Neg Hx    Colon cancer Neg Hx     Social History Social History   Tobacco Use   Smoking status: Every Day    Current packs/day: 0.25    Average packs/day: 0.3 packs/day for 2.0 years (0.5 ttl pk-yrs)    Types: Cigarettes   Smokeless tobacco: Never   Tobacco comments:    1 pack per week  Vaping Use   Vaping status: Never Used  Substance Use Topics   Alcohol use: Yes    Comment: occ   Drug use: No     Allergies   Penicillins   Review of Systems Review of Systems  Respiratory:  Positive for cough.      Physical Exam Triage Vital Signs ED Triage Vitals  Encounter Vitals Group     BP 06/17/23 1613 123/76     Systolic BP Percentile --      Diastolic BP Percentile --      Pulse Rate 06/17/23 1613 (!) 105     Resp 06/17/23 1613 20     Temp 06/17/23 1613 98.1 F (36.7 C)     Temp Source 06/17/23 1613 Oral     SpO2 06/17/23 1613 91 %     Weight --      Height --      Head Circumference --      Peak Flow --      Pain Score 06/17/23 1611  5     Pain Loc --      Pain Education --      Exclude from Growth Chart --    No data found.  Updated Vital Signs BP 123/76 (BP Location: Left Arm)   Pulse (!) 105   Temp 98.1 F (36.7 C) (Oral)   Resp 20   LMP 06/17/2023   SpO2 91%   Visual Acuity Right Eye Distance:   Left Eye Distance:   Bilateral Distance:    Right Eye Near:   Left Eye Near:    Bilateral Near:     Physical Exam Vitals reviewed.  Constitutional:      Appearance: Normal appearance.  HENT:     Head: Normocephalic and atraumatic.     Nose: Nose normal.  Eyes:     Extraocular Movements: Extraocular movements intact.     Pupils: Pupils are equal, round, and reactive to light.  Cardiovascular:     Rate and Rhythm: Normal rate and regular rhythm.  Pulmonary:     Effort: Pulmonary effort is normal.     Breath sounds: Wheezing and rhonchi present.  Musculoskeletal:        General: Normal range of motion.     Cervical back: Normal range of motion and  neck supple.  Skin:    General: Skin is warm and dry.  Neurological:     General: No focal deficit present.     Mental Status: She is alert.      UC Treatments / Results  Labs (all labs ordered are listed, but only abnormal results are displayed) Labs Reviewed - No data to display  EKG   Radiology DG Chest 2 View Result Date: 06/17/2023 CLINICAL DATA:  Cough and decreased oxygen saturations. EXAM: CHEST - 2 VIEW COMPARISON:  12/15/2022. FINDINGS: Trachea is midline. Heart size normal. Lungs are clear. No pleural fluid. There is motion on the lateral view, degrading image quality. IMPRESSION: No acute findings. Electronically Signed   By: Leanna Battles M.D.   On: 06/17/2023 16:59    Procedures Procedures (including critical care time)  Medications Ordered in UC Medications  albuterol (VENTOLIN HFA) 108 (90 Base) MCG/ACT inhaler 2 puff (2 puffs Inhalation Given 06/17/23 1709)    Initial Impression / Assessment and Plan / UC Course  I have  reviewed the triage vital signs and the nursing notes.  Pertinent labs & imaging results that were available during my care of the patient were reviewed by me and considered in my medical decision making (see chart for details).    -Acute Lower respiratory infection and Acute cough Chest x-ray negative for any obvious pneumonia. Empirically for a lower respiratory infection given patient has been sick for approximately 3 weeks and now has significant wheezing. Treating bronchial inflammation with prednisone 20 mg BID x 5 days. Albuterol 2 puffs every 4 hours as needed for wheezing and shortness of breath. Final Clinical Impressions(s) / UC Diagnoses   Final diagnoses:  Acute cough  Acute lower respiratory infection     Discharge Instructions      Call to reschedule colonoscopy given your symptoms do not recommend sedation until your wheezing and lower respiratory infection resolves.  Take medication as prescribed.  If symptoms have not resolved after completing medication return for evaluation.       ED Prescriptions     Medication Sig Dispense Auth. Provider   predniSONE (DELTASONE) 20 MG tablet Take 1 tablet (20 mg total) by mouth 2 (two) times daily with a meal. 10 tablet Bing Neighbors, NP   sulfamethoxazole-trimethoprim (BACTRIM DS) 800-160 MG tablet Take 1 tablet by mouth 2 (two) times daily for 7 days. 14 tablet Bing Neighbors, NP   promethazine-dextromethorphan (PROMETHAZINE-DM) 6.25-15 MG/5ML syrup Take 5 mLs by mouth 3 (three) times daily as needed for cough. 180 mL Bing Neighbors, NP   albuterol (VENTOLIN HFA) 108 (90 Base) MCG/ACT inhaler Inhale 2 puffs into the lungs every 4 (four) hours as needed for wheezing or shortness of breath. -- Bing Neighbors, NP      PDMP not reviewed this encounter.   Bing Neighbors, NP 06/17/23 [phone]

## 2023-06-17 NOTE — Assessment & Plan Note (Signed)
 Affecting medical care.  She now lives at a hotel but is unable to cook so has to rely on convenience for most of her nutrition.  We discussed ways to overcome barriers today.

## 2023-06-17 NOTE — Assessment & Plan Note (Signed)
 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  Patient aware of disease state and risk of progression. This may contribute to abnormal cravings, fatigue and diabetic complications without having diabetes.   We have discussed treatment options which include: losing 7 to 10% of body weight, increasing physical activity to a goal of 150 minutes a week at moderate intensity.  Advised to maintain a diet low on simple and processed carbohydrates.  She is currently on metformin for pharmacoprophylaxis.  She would benefit from GLP-1 therapy.  Continue metformin
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
## 2023-06-17 NOTE — Assessment & Plan Note (Signed)
 Patient has a slower than predicted metabolism. IC 1886 vs. calculated 2465. This may contribute to weight gain, chronic fatigue and difficulty losing weight.  Likely secondary to low volume of physical activity, obesogenic medications, genetics and possibly untreated sleep apnea.  I cannot find official sleep study results.   We reviewed measures to improve metabolism including not skipping meals, progressive strengthening exercises, increasing protein intake at every meal and maintaining adequate hydration and sleep.

## 2023-06-17 NOTE — Discharge Instructions (Addendum)
 Call to reschedule colonoscopy given your symptoms do not recommend sedation until your wheezing and lower respiratory infection resolves.  Take medication as prescribed.  If symptoms have not resolved after completing medication return for evaluation.

## 2023-06-17 NOTE — Assessment & Plan Note (Signed)
 Patient on multiple obesogenic medications.  We discussed how these medications may cause weight gain and affect her weight loss.  She is currently on metformin to help offset some of the weight gain.  Continue current regimen

## 2023-06-18 ENCOUNTER — Telehealth (INDEPENDENT_AMBULATORY_CARE_PROVIDER_SITE_OTHER): Payer: Self-pay

## 2023-06-18 ENCOUNTER — Telehealth: Payer: Self-pay | Admitting: Pediatrics

## 2023-06-18 MED ORDER — SUTAB 1479-225-188 MG PO TABS
ORAL_TABLET | ORAL | 0 refills | Status: DC
Start: 2023-06-18 — End: 2023-09-18

## 2023-06-18 NOTE — Telephone Encounter (Signed)
 Inbound call from patient stating that she is scheduled to have a colonoscopy on 4/3 at [facility] with Dr. Doy Hutching. Patient states that she was seen yesterday at [facility] for coughing and wheezing. She was advised to give [place] a call to let us know and see how we would ik to proceed moving forward. Please advise.

## 2023-06-18 NOTE — Telephone Encounter (Signed)
 Called and spoke with patient. Her EGD and colonoscopy have been rescheduled to [facility] on 07/2023 at 9 am, 730 am arrival time. I informed patient that I will send updated instructions via MyChart and will place a copy in the mail for her. Patient verbalized understanding and had no concerns at the end of the call.  New SUTAB RX sent to [facility].

## 2023-06-18 NOTE — Telephone Encounter (Signed)
 PA for Zepbound started, pt states she had a sleep study recently and does not recall where. This is [facility] insurance for obesity, can not submit OSA w/o sleep study.

## 2023-07-10 ENCOUNTER — Other Ambulatory Visit: Payer: Self-pay | Admitting: Family Medicine

## 2023-07-10 DIAGNOSIS — Z1231 Encounter for screening mammogram for malignant neoplasm of breast: Secondary | ICD-10-CM

## 2023-07-18 ENCOUNTER — Encounter (INDEPENDENT_AMBULATORY_CARE_PROVIDER_SITE_OTHER): Payer: Self-pay

## 2023-07-18 ENCOUNTER — Ambulatory Visit (INDEPENDENT_AMBULATORY_CARE_PROVIDER_SITE_OTHER): Admitting: Internal Medicine

## 2023-07-26 ENCOUNTER — Ambulatory Visit

## 2023-07-30 ENCOUNTER — Telehealth: Payer: Self-pay | Admitting: Gastroenterology

## 2023-07-30 ENCOUNTER — Encounter (HOSPITAL_COMMUNITY): Payer: Self-pay | Admitting: Pediatrics

## 2023-07-30 NOTE — Telephone Encounter (Signed)
 Procedure:Colonoscopy/Endoscopy Procedure date: 08/06/23 Procedure location: [facility] Arrival Time: 7:30 am Spoke with the patient Y/N: Yes Any prep concerns? No  Has the patient obtained the prep from the pharmacy ? Yes Do you have a care partner and transportation: Yes Any additional concerns? No

## 2023-08-06 ENCOUNTER — Encounter (HOSPITAL_COMMUNITY)
Admission: RE | Disposition: A | Discharge status: Home / Self Care | Payer: Self-pay | Source: Home / Self Care | Attending: Pediatrics

## 2023-08-06 ENCOUNTER — Other Ambulatory Visit: Payer: Self-pay

## 2023-08-06 ENCOUNTER — Ambulatory Visit (HOSPITAL_BASED_OUTPATIENT_CLINIC_OR_DEPARTMENT_OTHER): Admitting: Certified Registered Nurse Anesthetist

## 2023-08-06 ENCOUNTER — Ambulatory Visit (HOSPITAL_COMMUNITY): Admitting: Certified Registered Nurse Anesthetist

## 2023-08-06 ENCOUNTER — Ambulatory Visit (HOSPITAL_COMMUNITY)
Admission: RE | Admit: 2023-08-06 | Discharge: 2023-08-06 | Disposition: A | Discharge status: Home / Self Care | Payer: 59 | Attending: Pediatrics | Admitting: Pediatrics

## 2023-08-06 ENCOUNTER — Encounter (HOSPITAL_COMMUNITY): Payer: Self-pay | Admitting: Pediatrics

## 2023-08-06 DIAGNOSIS — M797 Fibromyalgia: Secondary | ICD-10-CM | POA: Diagnosis not present

## 2023-08-06 DIAGNOSIS — K319 Disease of stomach and duodenum, unspecified: Secondary | ICD-10-CM | POA: Diagnosis not present

## 2023-08-06 DIAGNOSIS — R197 Diarrhea, unspecified: Secondary | ICD-10-CM | POA: Diagnosis not present

## 2023-08-06 DIAGNOSIS — E119 Type 2 diabetes mellitus without complications: Secondary | ICD-10-CM | POA: Diagnosis not present

## 2023-08-06 DIAGNOSIS — K621 Rectal polyp: Secondary | ICD-10-CM | POA: Insufficient documentation

## 2023-08-06 DIAGNOSIS — K3189 Other diseases of stomach and duodenum: Secondary | ICD-10-CM | POA: Diagnosis not present

## 2023-08-06 DIAGNOSIS — K2970 Gastritis, unspecified, without bleeding: Secondary | ICD-10-CM

## 2023-08-06 DIAGNOSIS — F17210 Nicotine dependence, cigarettes, uncomplicated: Secondary | ICD-10-CM

## 2023-08-06 DIAGNOSIS — F17200 Nicotine dependence, unspecified, uncomplicated: Secondary | ICD-10-CM | POA: Insufficient documentation

## 2023-08-06 DIAGNOSIS — I10 Essential (primary) hypertension: Secondary | ICD-10-CM

## 2023-08-06 DIAGNOSIS — J45909 Unspecified asthma, uncomplicated: Secondary | ICD-10-CM | POA: Diagnosis not present

## 2023-08-06 DIAGNOSIS — D500 Iron deficiency anemia secondary to blood loss (chronic): Secondary | ICD-10-CM

## 2023-08-06 DIAGNOSIS — K5900 Constipation, unspecified: Secondary | ICD-10-CM | POA: Diagnosis not present

## 2023-08-06 DIAGNOSIS — K635 Polyp of colon: Secondary | ICD-10-CM

## 2023-08-06 DIAGNOSIS — M329 Systemic lupus erythematosus, unspecified: Secondary | ICD-10-CM | POA: Insufficient documentation

## 2023-08-06 DIAGNOSIS — K219 Gastro-esophageal reflux disease without esophagitis: Secondary | ICD-10-CM | POA: Diagnosis not present

## 2023-08-06 DIAGNOSIS — D509 Iron deficiency anemia, unspecified: Secondary | ICD-10-CM | POA: Insufficient documentation

## 2023-08-06 DIAGNOSIS — K529 Noninfective gastroenteritis and colitis, unspecified: Secondary | ICD-10-CM

## 2023-08-06 DIAGNOSIS — Z7984 Long term (current) use of oral hypoglycemic drugs: Secondary | ICD-10-CM | POA: Insufficient documentation

## 2023-08-06 DIAGNOSIS — D127 Benign neoplasm of rectosigmoid junction: Secondary | ICD-10-CM | POA: Diagnosis not present

## 2023-08-06 HISTORY — PX: BIOPSY OF SKIN SUBCUTANEOUS TISSUE AND/OR MUCOUS MEMBRANE: SHX6741

## 2023-08-06 HISTORY — PX: COLONOSCOPY WITH PROPOFOL: SHX5780

## 2023-08-06 HISTORY — PX: ESOPHAGOGASTRODUODENOSCOPY (EGD) WITH PROPOFOL: SHX5813

## 2023-08-06 SURGERY — COLONOSCOPY WITH PROPOFOL
Anesthesia: Monitor Anesthesia Care

## 2023-08-06 MED ORDER — PROPOFOL 10 MG/ML IV BOLUS
INTRAVENOUS | Status: DC | PRN
  Administered 2023-08-06: 30 mg via INTRAVENOUS

## 2023-08-06 MED ORDER — KETAMINE HCL 10 MG/ML IJ SOLN
INTRAMUSCULAR | Status: DC | PRN
  Administered 2023-08-06: 20 mg via INTRAVENOUS

## 2023-08-06 MED ORDER — PROPOFOL 1000 MG/100ML IV EMUL
INTRAVENOUS | Status: AC
  Filled 2023-08-06: qty 100

## 2023-08-06 MED ORDER — MIDAZOLAM HCL 2 MG/2ML IJ SOLN
INTRAMUSCULAR | Status: DC | PRN
  Administered 2023-08-06: 2 mg via INTRAVENOUS

## 2023-08-06 MED ORDER — MIDAZOLAM HCL 2 MG/2ML IJ SOLN
INTRAMUSCULAR | Status: AC
  Filled 2023-08-06: qty 2

## 2023-08-06 MED ORDER — SODIUM CHLORIDE 0.9 % IV SOLN: INTRAVENOUS | Status: DC

## 2023-08-06 MED ORDER — PROPOFOL 500 MG/50ML IV EMUL
INTRAVENOUS | Status: DC | PRN
  Administered 2023-08-06: 100 ug/kg/min via INTRAVENOUS

## 2023-08-06 MED ORDER — LIDOCAINE 2% (20 MG/ML) 5 ML SYRINGE
INTRAMUSCULAR | Status: DC | PRN
  Administered 2023-08-06: 60 mg via INTRAVENOUS

## 2023-08-06 SURGICAL SUPPLY — 20 items
BLOCK BITE 60FR ADLT L/F BLUE (MISCELLANEOUS) ×2 IMPLANT
ELECTRODE REM PT RTRN 9FT ADLT (ELECTROSURGICAL) IMPLANT
FORCEP RJ3 GP 1.8X160 W-NEEDLE (CUTTING FORCEPS) IMPLANT
FORCEPS BIOP RAD 4 LRG CAP 4 (CUTTING FORCEPS) IMPLANT
FORCEPS BXJMBJMB 240X2.8X (CUTTING FORCEPS) IMPLANT
INJECTOR/SNARE I SNARE (MISCELLANEOUS) IMPLANT
LUBRICANT JELLY 4.5OZ STERILE (MISCELLANEOUS) IMPLANT
MANIFOLD NEPTUNE II (INSTRUMENTS) IMPLANT
NDL SCLEROTHERAPY 25GX240 (NEEDLE) IMPLANT
NEEDLE SCLEROTHERAPY 25GX240 (NEEDLE) IMPLANT
PAD FLOOR 36X40 (MISCELLANEOUS) ×2 IMPLANT
PROBE APC STR FIRE (PROBE) IMPLANT
PROBE INJECTION GOLD 7FR (MISCELLANEOUS) IMPLANT
SNARE ROTATE MED OVAL 20MM (MISCELLANEOUS) IMPLANT
SNARE SHORT THROW 13M SML OVAL (MISCELLANEOUS) IMPLANT
SYR 50ML LL SCALE MARK (SYRINGE) IMPLANT
TRAP SPECIMEN MUCOUS 40CC (MISCELLANEOUS) IMPLANT
TUBING ENDO SMARTCAP PENTAX (MISCELLANEOUS) ×4 IMPLANT
TUBING IRRIGATION ENDOGATOR (MISCELLANEOUS) ×2 IMPLANT
WATER STERILE IRR 1000ML POUR (IV SOLUTION) IMPLANT

## 2023-08-06 NOTE — Discharge Instructions (Signed)

## 2023-08-06 NOTE — H&P (Signed)
 [facility] History and Physical   Primary Care Physician:  Lorella Roles, MD   Reason for Procedure:  Iron  deficiency anemia, constipation, diarrhea  Plan:    Upper endoscopy and colonoscopy     HPI: Tracy Banks is a 43 y.o. female undergoing upper endoscopy and colonoscopy for investigation of iron  deficiency anemia, constipation and diarrhea.  Laboratory studies between 2023-2024 showed hemoglobin 11.4-11.7.  Most recent MCV microcytic at 72.  Iron  studies in October 2024 showed iron  23, TIBC 464, ferritin 9.4.  Patient has been managed with oral iron  and iron  infusions.  Denies overt melena or hematochezia.  No known family history of gastric cancer, colorectal cancer or polyps.  Patient has never had an upper endoscopy or colonoscopy.   Past Medical History:  Diagnosis Date   Acid reflux    Anemia    Anxiety    Arthritis    Asthma    Back pain    Carpal tunnel syndrome    Depression    Eczema    Fibromyalgia    Gout    History of swelling of feet    Hyperlipidemia    Hypertension    Incontinence    Lupus    Migraine    Osteoarthritis    Overactive bladder    Pneumonia    Polyarthralgia    Vitamin D  deficiency     Past Surgical History:  Procedure Laterality Date   ANKLE SURGERY Bilateral    CHONDROPLASTY Right 01/04/2022   Procedure: KNEE ARTHROSCOPY CHONDROPLASTY;  Surgeon: Genevie Kerns, MD;  Location: [facility];  Service: Orthopedics;  Laterality: Right;  knee block  60   KNEE ARTHROSCOPY WITH LATERAL MENISECTOMY Right 01/04/2022   Procedure: KNEE ARTHROSCOPY WITH PARTIAL LATERAL MENISECTOMY;  Surgeon: Genevie Kerns, MD;  Location: [facility];  Service: Orthopedics;  Laterality: Right;  knee block 60   LEG SURGERY Right    Muscle stretched   NEUROMA SURGERY      Prior to Admission medications   Medication Sig Start Date End Date Taking? Authorizing Provider  albuterol  (VENTOLIN  HFA) 108 (90 Base) MCG/ACT inhaler Inhale 2 puffs into  the lungs every 4 (four) hours as needed for wheezing or shortness of breath. 06/17/23   Buena Carmine, NP  Cholecalciferol (VITAMIN D3) 50 MCG (2000 UT) capsule Take 1 capsule (2,000 Units total) by mouth daily. 12/25/22   Ladd Picker, MD  DULoxetine  (CYMBALTA ) 30 MG capsule Take 1 capsule (30 mg total) by mouth daily. 03/26/22 03/26/23  Lylia Sand, MD  DULoxetine  (CYMBALTA ) 60 MG capsule TAKE 1 CAPSULE(60 MG) BY MOUTH DAILY 11/03/22   Lylia Sand, MD  EMGALITY  120 MG/ML SOAJ Inject 120 mg into the skin every 30 (thirty) days. 06/06/23   Phebe Brasil, MD  folic acid  (FOLVITE ) 1 MG tablet Take 1 tablet (1 mg total) by mouth daily. 02/11/23   Burton, Lacie K, NP  HYDROcodone -acetaminophen  (NORCO/VICODIN) 5-325 MG tablet Take 1-2 tablets by mouth every 6 (six) hours as needed. 04/04/22   [provider]  hydroxychloroquine (PLAQUENIL) 200 MG tablet Take 200 mg by mouth 2 (two) times daily.    [provider]  ibuprofen  (ADVIL ) 800 MG tablet Take 800 mg by mouth every 8 (eight) hours as needed. 12/12/22   [provider]  metFORMIN  (GLUCOPHAGE -XR) 500 MG 24 hr tablet Take 2 tablets (1,000 mg total) by mouth 2 (two) times daily with a meal. 05/15/23   Ladd Picker, MD  nortriptyline  (PAMELOR ) 50 MG capsule Take 2 capsules (  100 mg total) by mouth at bedtime. 06/06/23   Phebe Brasil, MD  nystatin (MYCOSTATIN) 100000 UNIT/ML suspension     [provider]  omeprazole (PRILOSEC) 40 MG capsule Take 40 mg by mouth daily.    [provider]  predniSONE  (DELTASONE ) 20 MG tablet Take 1 tablet (20 mg total) by mouth 2 (two) times daily with a meal. 06/17/23   Buena Carmine, NP  pregabalin  (LYRICA ) 150 MG capsule Take 1 capsule (150 mg total) by mouth in the morning, at noon, and at bedtime. 04/04/22   Phebe Brasil, MD  promethazine -dextromethorphan (PROMETHAZINE -DM) 6.25-15 MG/5ML syrup Take 5 mLs by mouth 3 (three) times daily as needed for cough.  06/17/23   Buena Carmine, NP  propranolol  (INDERAL ) 40 MG tablet Take 1 tablet (40 mg total) by mouth 2 (two) times daily. 06/06/23   Phebe Brasil, MD  Rimegepant Sulfate (NURTEC) 75 MG TBDP Take 1 tab at onset of migraine.  May repeat in 2 hrs, if needed.  Max dose: 2 tabs/day. This is a 30 day prescription. 06/06/23   Phebe Brasil, MD  rosuvastatin (CRESTOR) 10 MG tablet Take 10 mg by mouth daily. 11/15/21   [provider]  Sodium Sulfate-Mag Sulfate-KCl (SUTAB ) 1479-225-188 MG TABS Use as directed for colonoscopy. MANUFACTURER CODES!! BIN: [id number] PCN: CN GROUP: [id number] MEMBER ID: [id number];YTK AS SECONDARY INSURANCE ;NO PRIOR AUTHORIZATION 06/18/23   Zahara Rembert, Scarlette Currier, MD  SUMAtriptan  (IMITREX ) 100 MG tablet Take 1 tablet (100 mg total) by mouth every 2 (two) hours as needed for migraine. May repeat in 2 hours if headache persists or recurs. 06/06/23   Phebe Brasil, MD  tirzepatide  (ZEPBOUND ) 2.5 MG/0.5ML Pen Inject 2.5 mg into the skin once a week. 05/15/23   Ladd Picker, MD  tolterodine (DETROL LA) 4 MG 24 hr capsule Take 4 mg by mouth daily. 02/05/21   [provider]  topiramate  (TOPAMAX ) 100 MG tablet One in am/ 2 at night 04/27/21   Yan, Yijun, MD  torsemide (DEMADEX) 20 MG tablet Take 20 mg by mouth daily.    [provider]  traMADol (ULTRAM) 50 MG tablet Take 50 mg by mouth daily.    [provider]  XTAMPZA  ER 13.5 MG C12A Take 1 capsule by mouth 2 (two) times daily. 01/11/23   [provider]  ziprasidone (GEODON) 20 MG capsule Take 20 mg by mouth See admin instructions. Take with 80 mg for a total of 100 mg daily 04/03/21   [provider]  ziprasidone (GEODON) 80 MG capsule Take 80 mg by mouth See admin instructions. Take with 20 mg for a total of 100 mg daily 12/04/21   [provider]  zolpidem (AMBIEN) 5 MG tablet Take 1 tablet by mouth at bedtime as needed.    [provider]    Current Facility-Administered  Medications  Medication Dose Route Frequency Provider Last Rate Last Admin   0.9 %  sodium chloride  infusion   Intravenous Continuous Jorden Minchey, Scarlette Currier, MD   New Bag at 08/06/23 [phone]    Allergies as of 04/03/2023 - Review Complete 04/03/2023  Allergen Reaction Noted   Penicillins Palpitations 12/20/2014    Family History  Problem Relation Age of Onset   Sarcoidosis Mother    Migraines Mother    Hypertension Mother    Osteoarthritis Mother    Diabetes Mother    Fibromyalgia Mother    Depression Mother    Sleep apnea Mother    Obesity Mother  Healthy Father    Liver disease Neg Hx    Esophageal cancer Neg Hx    Colon cancer Neg Hx     Social History   Socioeconomic History   Marital status: Single    Spouse name: stay at home   Number of children: 3   Years of education: college   Highest education level: Not on file  Occupational History   Occupation: unempolyed  Tobacco Use   Smoking status: Every Day    Current packs/day: 0.25    Average packs/day: 0.3 packs/day for 2.0 years (0.5 ttl pk-yrs)    Types: Cigarettes   Smokeless tobacco: Never   Tobacco comments:    1 pack per week  Vaping Use   Vaping status: Never Used  Substance and Sexual Activity   Alcohol use: Yes    Comment: occ   Drug use: No   Sexual activity: Not on file  Other Topics Concern   Not on file  Social History Narrative   Lives at home with children.   Right-handed.   Caffeine use:  One can soda per day.   Social Drivers of Corporate investment banker Strain: Not on file  Food Insecurity: Not on file  Transportation Needs: Not on file  Physical Activity: Not on file  Stress: Not on file  Social Connections: Unknown (07/24/2021)   Received from [facility]   Social Network    Social Network: Not on file  Intimate Partner Violence: Unknown (06/22/2021)   Received from [facility]   HITS    Physically Hurt: Not on file    Insult or Talk Down To:  Not on file    Threaten Physical Harm: Not on file    Scream or Curse: Not on file    Review of Systems:  All other review of systems negative except as mentioned in the HPI.  Physical Exam: Vital signs BP 118/75   Pulse (!) 106   Temp 97.9 F (36.6 C) (Temporal)   Resp 20   Ht 5\' 9"  (1.753 m)   Wt (!) 166.9 kg   LMP 07/08/2023   SpO2 100%   BMI 54.34 kg/m   General:   Alert,  Well-developed, well-nourished, pleasant and cooperative in NAD Airway:  Mallampati 2 Lungs:  Clear throughout to auscultation.   Heart:  Regular rate and rhythm; no murmurs, clicks, rubs,  or gallops. Abdomen:  Soft, nontender and nondistended. Normal bowel sounds.   Neuro/Psych:  Normal mood and affect. A and O x 3  Eugenia Hess, MD [facility]

## 2023-08-06 NOTE — Op Note (Signed)
 [facility] Patient Name: Tracy Banks Procedure Date: 08/06/2023 MRN: [id number] Attending MD: Eugenia Hess , MD, [id number] Date of Birth: 07/16/1980 CSN: [id number] Age: 43 Admit Type: Outpatient Procedure:                Upper GI endoscopy Indications:              Iron  deficiency anemia, Diarrhea, Constipation Providers:                Eugenia Hess, MD, Suzann Ernst, RN, Joline Ned,                            Technician Referring MD:              Medicines:                Monitored Anesthesia Care Complications:            No immediate complications. Estimated blood loss:                            Minimal. Estimated Blood Loss:     Estimated blood loss was minimal. Procedure:                Pre-Anesthesia Assessment:                           - Prior to the procedure, a History and Physical                            was performed, and patient medications and                            allergies were reviewed. The patient's tolerance of                            previous anesthesia was also reviewed. The risks                            and benefits of the procedure and the sedation                            options and risks were discussed with the patient.                            All questions were answered, and informed consent                            was obtained. Prior Anticoagulants: The patient has                            taken no anticoagulant or antiplatelet agents. ASA                            Grade Assessment: III - A patient with severe  systemic disease. After reviewing the risks and                            benefits, the patient was deemed in satisfactory                            condition to undergo the procedure.                           After obtaining informed consent, the endoscope was                            passed under direct vision. Throughout the                            procedure,  the patient's blood pressure, pulse, and                            oxygen saturations were monitored continuously. The                            [id number]-[id number] ([id number] Olympus endoscope was introduced                            through the mouth, and advanced to the second part                            of duodenum. The upper GI endoscopy was                            accomplished without difficulty. The patient                            tolerated the procedure well. Scope In: Scope Out: Findings:      The examined esophagus was normal.      Patchy moderate inflammation characterized by erosions and erythema was       found in the gastric antrum and in the prepyloric region of the stomach.       Biopsies were taken with a cold forceps for Helicobacter pylori testing.      The gastric body, cardia (on retroflexion) and gastric fundus (on       retroflexion) were normal. Biopsies were taken with a cold forceps for       Helicobacter pylori testing.      The duodenal bulb and second portion of the duodenum were normal. Impression:               - Normal esophagus.                           - Gastritis. Biopsied.                           - Normal gastric body, cardia and gastric fundus.  Biopsied.                           - Normal duodenal bulb and second portion of the                            duodenum. Moderate Sedation:      Not Applicable - Patient had care per Anesthesia. Recommendation:           - Await pathology results.                           - Perform a colonoscopy today.                           - The findings and recommendations were discussed                            with the patient. Procedure Code(s):        --- Professional ---                           [id number], Esophagogastroduodenoscopy, flexible,                            transoral; with biopsy, single or multiple Diagnosis Code(s):        --- Professional ---                            K29.70, Gastritis, unspecified, without bleeding                           D50.9, Iron  deficiency anemia, unspecified                           R19.7, Diarrhea, unspecified CPT copyright 2022 [employer]. All rights reserved. The codes documented in this report are preliminary and upon coder review may  be revised to meet current compliance requirements. Eugenia Hess, MD 08/06/2023 9:32:59 AM This report has been signed electronically. Number of Addenda: 0

## 2023-08-06 NOTE — Anesthesia Preprocedure Evaluation (Addendum)
 Anesthesia Evaluation  Patient identified by MRN, date of birth, ID band Patient awake    Reviewed: Allergy & Precautions, NPO status , Patient's Chart, lab work & pertinent test results  Airway Mallampati: III  TM Distance: >3 FB Neck ROM: Full    Dental no notable dental hx.    Pulmonary asthma , Current Smoker and Patient abstained from smoking.   Pulmonary exam normal        Cardiovascular hypertension, Pt. on medications and Pt. on home beta blockers  Rhythm:Regular Rate:Normal     Neuro/Psych   Anxiety Depression       GI/Hepatic Neg liver ROS,GERD  ,,  Endo/Other  diabetes, Type 2, Oral Hypoglycemic Agents    Renal/GU negative Renal ROS  negative genitourinary   Musculoskeletal  (+) Arthritis ,  Fibromyalgia -  Abdominal Normal abdominal exam  (+)   Peds  Hematology  (+) Blood dyscrasia, anemia   Anesthesia Other Findings   Reproductive/Obstetrics                             Anesthesia Physical Anesthesia Plan  ASA: 3  Anesthesia Plan: MAC   Post-op Pain Management:    Induction: Intravenous  PONV Risk Score and Plan: 1 and Propofol  infusion and Treatment may vary due to age or medical condition  Airway Management Planned: Simple Face Mask and Nasal Cannula  Additional Equipment: None  Intra-op Plan:   Post-operative Plan:   Informed Consent: I have reviewed the patients History and Physical, chart, labs and discussed the procedure including the risks, benefits and alternatives for the proposed anesthesia with the patient or authorized representative who has indicated his/her understanding and acceptance.     Dental advisory given  Plan Discussed with: CRNA  Anesthesia Plan Comments:        Anesthesia Quick Evaluation

## 2023-08-06 NOTE — Anesthesia Procedure Notes (Signed)
 Procedure Name: MAC Date/Time: 08/06/2023 9:01 AM  Performed by: Rochell Chroman, CRNAPre-anesthesia Checklist: Patient identified, Emergency Drugs available, Suction available and Patient being monitored Patient Re-evaluated:Patient Re-evaluated prior to induction Oxygen Delivery Method: Simple face mask

## 2023-08-06 NOTE — Anesthesia Postprocedure Evaluation (Signed)
 Anesthesia Post Note  Patient: Tracy Banks  Procedure(s) Performed: COLONOSCOPY WITH PROPOFOL  ESOPHAGOGASTRODUODENOSCOPY (EGD) WITH PROPOFOL  BIOPSY, SKIN, SUBCUTANEOUS TISSUE, OR MUCOUS MEMBRANE     Patient location during evaluation: PACU Anesthesia Type: MAC Level of consciousness: awake and alert Pain management: pain level controlled Vital Signs Assessment: post-procedure vital signs reviewed and stable Respiratory status: spontaneous breathing, nonlabored ventilation, respiratory function stable and patient connected to nasal cannula oxygen Cardiovascular status: stable and blood pressure returned to baseline Postop Assessment: no apparent nausea or vomiting Anesthetic complications: no   No notable events documented.  Last Vitals:  Vitals:   08/06/23 1020 08/06/23 1031  BP: (!) 132/96 (!) 138/95  Pulse: (!) 105 75  Resp: 19 13  Temp:    SpO2: 99% 100%    Last Pain:  Vitals:   08/06/23 1031  TempSrc:   PainSc: 0-No pain                 Theotis Flake P Tora Prunty

## 2023-08-06 NOTE — Op Note (Signed)
 [facility] Patient Name: Tracy Banks Procedure Date: 08/06/2023 MRN: [id number] Attending MD: Eugenia Hess , MD, [id number] Date of Birth: 1980-08-19 CSN: [id number] Age: 43 Admit Type: Outpatient Procedure:                Colonoscopy Indications:              This is the patient's first colonoscopy, Diarrhea,                            Iron  deficiency anemia, Constipation Providers:                Eugenia Hess, MD, Suzann Ernst, RN, Joline Ned,                            Technician Referring MD:              Medicines:                Monitored Anesthesia Care Complications:            No immediate complications. Estimated blood loss:                            Minimal. Estimated Blood Loss:     Estimated blood loss was minimal. Procedure:                Pre-Anesthesia Assessment:                           - Prior to the procedure, a History and Physical                            was performed, and patient medications and                            allergies were reviewed. The patient's tolerance of                            previous anesthesia was also reviewed. The risks                            and benefits of the procedure and the sedation                            options and risks were discussed with the patient.                            All questions were answered, and informed consent                            was obtained. Prior Anticoagulants: The patient has                            taken no anticoagulant or antiplatelet agents. ASA                            Grade Assessment: III -  A patient with severe                            systemic disease. After reviewing the risks and                            benefits, the patient was deemed in satisfactory                            condition to undergo the procedure.                           After obtaining informed consent, the colonoscope                            was passed under  direct vision. Throughout the                            procedure, the patient's blood pressure, pulse, and                            oxygen saturations were monitored continuously. The                            [id number]-[id number] ([id number]) Olympus colonoscope was                            introduced through the anus and advanced to the                            cecum, identified by appendiceal orifice and                            ileocecal valve. The colonoscopy was performed                            without difficulty. The patient tolerated the                            procedure well. The quality of the bowel                            preparation was good. The ileocecal valve,                            appendiceal orifice, and rectum were photographed. Scope In: 9:35:17 AM Scope Out: 9:59:11 AM Scope Withdrawal Time: 0 hours 18 minutes 44 seconds  Total Procedure Duration: 0 hours 23 minutes 54 seconds  Findings:      The perianal and digital rectal examinations were normal. Pertinent       negatives include normal sphincter tone and no palpable rectal lesions.      Normal mucosa was found in the entire colon. Biopsies for histology were       taken with a cold forceps for evaluation of microscopic colitis.  Multiple sessile polyps were found in the recto-sigmoid colon. The       polyps were 2 to 4 mm in size. These polyps were removed with a cold       biopsy forceps. Resection and retrieval were complete.      The terminal ileum appeared normal.      The retroflexed view of the distal rectum and anal verge was normal and       showed no anal or rectal abnormalities. Impression:               - Normal mucosa in the entire examined colon.                            Biopsied.                           - Multiple 2 to 4 mm polyps at the recto-sigmoid                            colon, removed with a cold biopsy forceps. Resected                            and retrieved.                            - The examined portion of the ileum was normal.                           - The distal rectum and anal verge are normal on                            retroflexion view.                           - There were no findings on colonoscopy to explain                            the patient's history of iron  deficiency anemia. Moderate Sedation:      Not Applicable - Patient had care per Anesthesia. Recommendation:           - Discharge patient to home (ambulatory).                           - Await pathology results.                           - Repeat colonoscopy for surveillance based on                            pathology results.                           - The findings and recommendations were discussed                            with the patient.                           -  Return to GI clinic in 6 weeks.                           - Patient has a contact number available for                            emergencies. The signs and symptoms of potential                            delayed complications were discussed with the                            patient. Return to normal activities tomorrow.                            Written discharge instructions were provided to the                            patient. 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 All rights reserved. The codes documented in this report are preliminary and upon coder review may  be revised to meet current compliance requirements. Eugenia Hess, MD 08/06/2023 10:05:49 AM This report has been  signed electronically. Number of Addenda: 0

## 2023-08-06 NOTE — Transfer of Care (Signed)
 Immediate Anesthesia Transfer of Care Note  Patient: Tracy Banks  Procedure(s) Performed: COLONOSCOPY WITH PROPOFOL  ESOPHAGOGASTRODUODENOSCOPY (EGD) WITH PROPOFOL  BIOPSY, SKIN, SUBCUTANEOUS TISSUE, OR MUCOUS MEMBRANE  Patient Location: Endoscopy Unit  Anesthesia Type:MAC  Level of Consciousness: awake and patient cooperative  Airway & Oxygen Therapy: Patient Spontanous Breathing and Patient connected to face mask  Post-op Assessment: Report given to RN and Post -op Vital signs reviewed and stable  Post vital signs: Reviewed and stable  Last Vitals:  Vitals Value Taken Time  BP    Temp    Pulse    Resp    SpO2      Last Pain:  Vitals:   08/06/23 0824  TempSrc: Temporal  PainSc: 0-No pain         Complications: No notable events documented.

## 2023-08-07 ENCOUNTER — Encounter (HOSPITAL_COMMUNITY): Payer: Self-pay | Admitting: Pediatrics

## 2023-08-07 LAB — SURGICAL PATHOLOGY

## 2023-08-08 ENCOUNTER — Ambulatory Visit: Payer: Self-pay | Admitting: Pediatrics

## 2023-08-13 ENCOUNTER — Encounter (INDEPENDENT_AMBULATORY_CARE_PROVIDER_SITE_OTHER): Payer: Self-pay | Admitting: Internal Medicine

## 2023-08-13 ENCOUNTER — Ambulatory Visit (INDEPENDENT_AMBULATORY_CARE_PROVIDER_SITE_OTHER): Admitting: Internal Medicine

## 2023-08-13 VITALS — BP 105/73 | HR 100 | Temp 98.7°F | Ht 69.0 in | Wt 379.0 lb

## 2023-08-13 DIAGNOSIS — Z6841 Body Mass Index (BMI) 40.0 and over, adult: Secondary | ICD-10-CM

## 2023-08-13 DIAGNOSIS — I10 Essential (primary) hypertension: Secondary | ICD-10-CM | POA: Diagnosis not present

## 2023-08-13 DIAGNOSIS — R29818 Other symptoms and signs involving the nervous system: Secondary | ICD-10-CM | POA: Diagnosis not present

## 2023-08-13 DIAGNOSIS — E66813 Obesity, class 3: Secondary | ICD-10-CM

## 2023-08-13 DIAGNOSIS — Z79899 Other long term (current) drug therapy: Secondary | ICD-10-CM

## 2023-08-13 DIAGNOSIS — T50905A Adverse effect of unspecified drugs, medicaments and biological substances, initial encounter: Secondary | ICD-10-CM

## 2023-08-13 DIAGNOSIS — Z59819 Housing instability, housed unspecified: Secondary | ICD-10-CM | POA: Diagnosis not present

## 2023-08-13 NOTE — Progress Notes (Signed)
 Office: [phone]  /  Fax: [phone]  Weight Summary And Biometrics  Vitals Temp: 98.7 F (37.1 C) BP: 105/73 Pulse Rate: 100 SpO2: 100 %   Anthropometric Measurements Height: 5\' 9"  (1.753 m) Weight: (!) 379 lb (171.9 kg) BMI (Calculated): 55.94 Weight at Last Visit: 365lb Weight Lost Since Last Visit: 0lb Weight Gained Since Last Visit: 14lb Starting Weight: 379lb Total Weight Loss (lbs): 0 lb (0 kg)   Body Composition  Body Fat %: 54.4 % Fat Mass (lbs): 206.4 lbs Muscle Mass (lbs): 164.4 lbs Total Body Water (lbs): 132.2 lbs Visceral Fat Rating : 22    No data recorded Today's Visit #: 9  Starting Date: 06/20/22   Subjective   Chief Complaint: Obesity  Interval History Discussed the use of AI scribe software for clinical note transcription with the patient, who gave verbal consent to proceed.  History of Present Illness   Tracy Banks is a 43 year old female who presents for medical weight management.  She initially experienced success in losing weight but has since experienced significant weight regain due to unstable housing, work environment, and mental health issues. Previously, she lived in a hotel where she could not cook, leading to reliance on fast food due to financial constraints and convenience. Now staying with her daughter, she has access to a kitchen and plans to resume cooking and meal prepping.  She is on multiple medications that contribute to weight gain, including nortriptyline , Lyrica , propranolol , Xtampza , Geodon, and Ambien. She is also on metformin .  She has a history of sleep apnea and is scheduled for a new sleep study in July. She experiences snoring, talking in her sleep, feeling tired upon waking, and daytime sleepiness, stating that she falls asleep if she sits down.  She has a history of a persistent cough for which she was prescribed steroids in March. The cough has improved somewhat, and she is currently using an  inhaler.  Her social history includes staying with her daughter for about five months to save money for her own apartment. 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 She is on Medicaid and Medicare and is on disability.  Family history reveals that her oldest child is overweight, which she attributes to genetics and a history of steroid use for asthma. Her other children are not overweight.       Challenges affecting patient progress: multiple competing priorities, cost of healthy foods, having difficulty preparing healthy meals, having difficulty with meal prep and planning, having difficulty focusing on healthy eating, difficulty implementing reduced calorie nutrition plan, low volume of physical activity at present , medical comorbidities, presence of obesogenic drugs, inadequate sleep, moderate to high levels of stress, slow metabolism for age, and unstable housing.    Pharmacotherapy for weight management: She is currently taking Metformin  (off label use for incretin effect and / or insulin  resistance and / or diabetes prevention) without clinical response and without side effects. and insurance denied Wegovy  awaiting sleep study to see if she will qualify for Zepbound .   Assessment and Plan   Treatment Plan For Obesity:  Recommended Dietary Goals  Cameryn is currently in the action stage of change. As such, her goal is to continue weight management plan. She has agreed to: portion control, balanced plate and making smarter food choices, such as increasing vegetables, protein intake and reducing simple carbohydrates and processed foods  and continue to work on implementation of reduced calorie nutrition plan (RCNP)  Behavioral Health and  Counseling  We discussed the following behavioral modification strategies today: increasing lean protein intake to established goals, decreasing simple carbohydrates , increasing vegetables, increasing lower glycemic fruits, increasing fiber  rich foods, avoiding skipping meals, increasing water intake , work on meal planning and preparation, reading food labels , keeping healthy foods at home, and decreasing eating out or consumption of processed foods, and making healthy choices when eating convenient foods.  Additional education and resources provided today: None  Recommended Physical Activity Goals  Melanny has been advised to work up to 150 minutes of moderate intensity aerobic activity a week and strengthening exercises 2-3 times per week for cardiovascular health, weight loss maintenance and preservation of muscle mass.   She has agreed to :  Think about enjoyable ways to increase daily physical activity and overcoming barriers to exercise and Increase physical activity in their day and reduce sedentary time (increase NEAT).  Pharmacotherapy  We discussed various medication options to help [facility] with her weight loss efforts and we both agreed to : Continue metformin  insurance denied Wegovy  consider Zepbound  once sleep study gets completed in July  Associated Conditions Impacted by Obesity Treatment  Primary hypertension Assessment & Plan: Improved.  On propranolol  which may contribute to weight gain.  I recommend considering a weight neutral beta-blocker like carvedilol or Bystolic.  Losing 10% of body weight may improve condition.  She will work with prescribing physicians to reduce the number of weight promoting medications.   Class 3 severe obesity with serious comorbidity and body mass index (BMI) of 50.0 to 59.9 in adult Assessment & Plan: Patient has multiple contributing factors including genetics, psychosocial matters presence of obesogenic medications abnormal metabolic rate and low levels of physical activity.  She also has strong orexigenic signaling and would benefit from GLP-1 therapy.  Unfortunately her insurance denied coverage for Wegovy  which I thought was covered by Medicaid.  She is scheduled to have a  sleep study done in July and this may qualify her for Zepbound .  She will continue on metformin  to offset some of the weight gain associated with psychotropic medication.  She is now living with her daughter and has access to a kitchen so she feels she is going to start making meals at home and relying less on convenient foods.  She did well when she did meal prep before.   Suspected sleep apnea Assessment & Plan: I suspect she has severe sleep apnea based on her symptoms may have hypoventilation.  She is also on narcotics and other CNS depressants and I conveyed to her my concern about her safety.  She will avoid taking Ambien along with her pain medications.  She will work with pain management and neurology to reduce polypharmacy.  She has Narcan at home and her son is trained on its use.   Weight gain due to medication Assessment & Plan: Patient on multiple obesogenic medications.  These include: Geodon, nortriptyline , propranolol , Lyrica , Xtampza  we discussed how these medications may cause weight gain and affect her weight loss.  She is currently on metformin  to help offset some of the weight gain.  Continue current regimen work with prescribing physicians to change to more weight neutral alternatives.   Housing situation unstable  Polypharmacy Assessment & Plan: I reviewed medications with patient and expressed my safety concerns pertaining to drug to drug interactions the likelihood that she may have severe sleep apnea in the presence of CNS depressing drugs.  She will avoid taking Ambien with all of her other  medications.  She will reach out to her prescribing physicians to work on reducing polypharmacy and use of weight promoting medications which may make her weight loss more difficult.        Objective   Physical Exam:  Blood pressure 105/73, pulse 100, temperature 98.7 F (37.1 C), height 5\' 9"  (1.753 m), weight (!) 379 lb (171.9 kg), SpO2 100%. Body mass index is 55.97  kg/m.  General: She is overweight, cooperative, alert, well developed, and in no acute distress. PSYCH: Has normal mood, affect and thought process.   HEENT: EOMI, sclerae are anicteric. Lungs: Normal breathing effort, no conversational dyspnea. Extremities: No edema.  Neurologic: No gross sensory or motor deficits. No tremors or fasciculations noted.    Diagnostic Data Reviewed:  BMET    Component Value Date/Time   NA 140 01/12/2023 1301   NA 144 06/20/2022 1454   K 3.6 01/12/2023 1301   CL 104 01/12/2023 1301   CO2 27 01/12/2023 1301   GLUCOSE 88 01/12/2023 1301   BUN 11 01/12/2023 1301   BUN 10 06/20/2022 1454   CREATININE 1.08 (H) 01/12/2023 1301   CALCIUM 8.8 (L) 01/12/2023 1301   GFRNONAA >60 01/12/2023 1301   GFRAA 76 06/11/2019 1024   Lab Results  Component Value Date   HGBA1C 5.7 (H) 06/20/2022   HGBA1C 5.3 06/11/2019   Lab Results  Component Value Date   INSULIN  46.4 (H) 06/20/2022   Lab Results  Component Value Date   TSH 1.500 06/20/2022   CBC    Component Value Date/Time   WBC 7.8 02/11/2023 1247   WBC 7.4 01/12/2023 1301   RBC 4.06 02/11/2023 1247   RBC 4.03 02/11/2023 1246   HGB 8.6 (L) 02/11/2023 1247   HGB 11.4 06/20/2022 1454   HCT 29.3 (L) 02/11/2023 1247   HCT 37.4 06/20/2022 1454   PLT 211 02/11/2023 1247   PLT 279 06/20/2022 1454   MCV 72.2 (L) 02/11/2023 1247   MCV 74 (L) 06/20/2022 1454   MCH 21.2 (L) 02/11/2023 1247   MCHC 29.4 (L) 02/11/2023 1247   RDW 18.7 (H) 02/11/2023 1247   RDW 17.3 (H) 06/20/2022 1454   Iron  Studies    Component Value Date/Time   IRON  15 (L) 02/11/2023 1247   TIBC 458 (H) 02/11/2023 1247   FERRITIN 9 (L) 02/11/2023 1247   IRONPCTSAT 3 (L) 02/11/2023 1247   Lipid Panel     Component Value Date/Time   CHOL 98 (L) 06/20/2022 1454   TRIG 97 06/20/2022 1454   HDL 45 06/20/2022 1454   LDLCALC 34 06/20/2022 1454   Hepatic Function Panel     Component Value Date/Time   PROT 6.5 01/12/2023 1301    PROT 7.9 06/20/2022 1454   ALBUMIN 2.9 (L) 01/12/2023 1301   ALBUMIN 4.1 06/20/2022 1454   AST 31 01/12/2023 1301   ALT 20 01/12/2023 1301   ALKPHOS 75 01/12/2023 1301   BILITOT 0.3 01/12/2023 1301   BILITOT 0.3 06/20/2022 1454      Component Value Date/Time   TSH 1.500 06/20/2022 1454   Nutritional Lab Results  Component Value Date   VD25OH 29.1 (L) 06/20/2022   VD25OH 10.0 (L) 07/08/2019    Medications: Outpatient Encounter Medications as of 08/13/2023  Medication Sig Note   albuterol  (VENTOLIN  HFA) 108 (90 Base) MCG/ACT inhaler Inhale 2 puffs into the lungs every 4 (four) hours as needed for wheezing or shortness of breath.    Cholecalciferol (VITAMIN D3) 50 MCG (2000 UT)  capsule Take 1 capsule (2,000 Units total) by mouth daily.    DULoxetine  (CYMBALTA ) 60 MG capsule TAKE 1 CAPSULE(60 MG) BY MOUTH DAILY    EMGALITY  120 MG/ML SOAJ Inject 120 mg into the skin every 30 (thirty) days.    folic acid  (FOLVITE ) 1 MG tablet Take 1 tablet (1 mg total) by mouth daily.    HYDROcodone -acetaminophen  (NORCO/VICODIN) 5-325 MG tablet Take 1-2 tablets by mouth every 6 (six) hours as needed.    hydroxychloroquine (PLAQUENIL) 200 MG tablet Take 200 mg by mouth 2 (two) times daily.    metFORMIN  (GLUCOPHAGE -XR) 500 MG 24 hr tablet Take 2 tablets (1,000 mg total) by mouth 2 (two) times daily with a meal.    nortriptyline  (PAMELOR ) 50 MG capsule Take 2 capsules (100 mg total) by mouth at bedtime.    omeprazole (PRILOSEC) 40 MG capsule Take 40 mg by mouth daily.    pregabalin  (LYRICA ) 150 MG capsule Take 1 capsule (150 mg total) by mouth in the morning, at noon, and at bedtime.    propranolol  (INDERAL ) 40 MG tablet Take 1 tablet (40 mg total) by mouth 2 (two) times daily.    Rimegepant Sulfate (NURTEC) 75 MG TBDP Take 1 tab at onset of migraine.  May repeat in 2 hrs, if needed.  Max dose: 2 tabs/day. This is a 30 day prescription.    rosuvastatin (CRESTOR) 10 MG tablet Take 10 mg by mouth daily.     SUMAtriptan  (IMITREX ) 100 MG tablet Take 1 tablet (100 mg total) by mouth every 2 (two) hours as needed for migraine. May repeat in 2 hours if headache persists or recurs.    SYMBICORT 80-4.5 MCG/ACT inhaler Inhale 2 puffs into the lungs.    tolterodine (DETROL LA) 4 MG 24 hr capsule Take 4 mg by mouth daily.    topiramate  (TOPAMAX ) 100 MG tablet One in am/ 2 at night    torsemide (DEMADEX) 20 MG tablet Take 20 mg by mouth daily.    traMADol (ULTRAM) 50 MG tablet Take 50 mg by mouth daily. 01/18/2022: Not [facility]   XTAMPZA  ER 13.5 MG C12A Take 1 capsule by mouth 2 (two) times daily.    ziprasidone (GEODON) 20 MG capsule Take 20 mg by mouth See admin instructions. Take with 80 mg for a total of 100 mg daily    ziprasidone (GEODON) 80 MG capsule Take 80 mg by mouth See admin instructions. Take with 20 mg for a total of 100 mg daily    zolpidem (AMBIEN) 5 MG tablet Take 1 tablet by mouth at bedtime as needed.    DULoxetine  (CYMBALTA ) 30 MG capsule Take 1 capsule (30 mg total) by mouth daily.    ibuprofen  (ADVIL ) 800 MG tablet Take 800 mg by mouth every 8 (eight) hours as needed. (Patient not taking: Reported on 08/13/2023)    nystatin (MYCOSTATIN) 100000 UNIT/ML suspension  (Patient not taking: Reported on 08/13/2023)    Sodium Sulfate-Mag Sulfate-KCl (SUTAB ) 775 131 [id number] MG TABS Use as directed for colonoscopy. MANUFACTURER CODES!! BIN: [id number] PCN: [id number] GROUP: [id number] MEMBER ID: [id number];FTD AS SECONDARY INSURANCE ;NO PRIOR AUTHORIZATION (Patient not taking: Reported on 08/13/2023)    [DISCONTINUED] predniSONE  (DELTASONE ) 20 MG tablet Take 1 tablet (20 mg total) by mouth 2 (two) times daily with a meal. (Patient not taking: Reported on 08/13/2023)    [DISCONTINUED] promethazine -dextromethorphan (PROMETHAZINE -DM) 6.25-15 MG/5ML syrup Take 5 mLs by mouth 3 (three) times daily as needed for cough. (Patient not taking: Reported on 08/13/2023)    [DISCONTINUED]  tirzepatide  (ZEPBOUND ) 2.5 MG/0.5ML Pen Inject 2.5  mg into the skin once a week. (Patient not taking: Reported on 08/13/2023)    No facility-administered encounter medications on file as of 08/13/2023.     Follow-Up   Return in about 5 weeks (around 09/17/2023) for For Weight Mangement with Dr. Allie Area.Aaron Aas She was informed of the importance of frequent follow up visits to maximize her success with intensive lifestyle modifications for her multiple health conditions.  Attestation Statement   Reviewed by clinician on day of visit: allergies, medications, problem list, medical history, surgical history, family history, social history, and previous encounter notes.     Ladd Picker, MD

## 2023-08-13 NOTE — Assessment & Plan Note (Signed)
 Improved.  On propranolol  which may contribute to weight gain.  I recommend considering a weight neutral beta-blocker like carvedilol or Bystolic.  Losing 10% of body weight may improve condition.  She will work with prescribing physicians to reduce the number of weight promoting medications.

## 2023-08-13 NOTE — Assessment & Plan Note (Signed)
 I suspect she has severe sleep apnea based on her symptoms may have hypoventilation.  She is also on narcotics and other CNS depressants and I conveyed to her my concern about her safety.  She will avoid taking Ambien along with her pain medications.  She will work with pain management and neurology to reduce polypharmacy.  She has Narcan at home and her son is trained on its use.

## 2023-08-13 NOTE — Assessment & Plan Note (Signed)
 I reviewed medications with patient and expressed my safety concerns pertaining to drug to drug interactions the likelihood that she may have severe sleep apnea in the presence of CNS depressing drugs.  She will avoid taking Ambien with all of her other medications.  She will reach out to her prescribing physicians to work on reducing polypharmacy and use of weight promoting medications which may make her weight loss more difficult.

## 2023-08-13 NOTE — Assessment & Plan Note (Addendum)
 Patient has multiple contributing factors including genetics, psychosocial matters presence of obesogenic medications abnormal metabolic rate and low levels of physical activity.  She also has strong orexigenic signaling and would benefit from GLP-1 therapy.  Unfortunately her insurance denied coverage for Wegovy  which I thought was covered by Medicaid.  She is scheduled to have a sleep study done in July and this may qualify her for Zepbound .  She will continue on metformin  to offset some of the weight gain associated with psychotropic medication.  She is now living with her daughter and has access to a kitchen so she feels she is going to start making meals at home and relying less on convenient foods.  She did well when she did meal prep before.

## 2023-08-13 NOTE — Assessment & Plan Note (Signed)
 Patient on multiple obesogenic medications.  These include: Geodon, nortriptyline , propranolol , Lyrica , Xtampza  we discussed how these medications may cause weight gain and affect her weight loss.  She is currently on metformin  to help offset some of the weight gain.  Continue current regimen work with prescribing physicians to change to more weight neutral alternatives.

## 2023-08-15 ENCOUNTER — Ambulatory Visit

## 2023-08-19 ENCOUNTER — Ambulatory Visit
Admission: RE | Admit: 2023-08-19 | Discharge: 2023-08-19 | Disposition: A | Discharge status: Home / Self Care | Source: Ambulatory Visit | Attending: Family Medicine | Admitting: Family Medicine

## 2023-08-19 DIAGNOSIS — Z1231 Encounter for screening mammogram for malignant neoplasm of breast: Secondary | ICD-10-CM

## 2023-09-18 ENCOUNTER — Ambulatory Visit (INDEPENDENT_AMBULATORY_CARE_PROVIDER_SITE_OTHER): Admitting: Internal Medicine

## 2023-09-18 ENCOUNTER — Encounter (INDEPENDENT_AMBULATORY_CARE_PROVIDER_SITE_OTHER): Payer: Self-pay | Admitting: Internal Medicine

## 2023-09-18 VITALS — BP 110/76 | HR 98 | Temp 98.5°F | Ht 69.0 in | Wt 367.0 lb

## 2023-09-18 DIAGNOSIS — E669 Obesity, unspecified: Secondary | ICD-10-CM

## 2023-09-18 DIAGNOSIS — E7800 Pure hypercholesterolemia, unspecified: Secondary | ICD-10-CM

## 2023-09-18 DIAGNOSIS — R635 Abnormal weight gain: Secondary | ICD-10-CM

## 2023-09-18 DIAGNOSIS — I10 Essential (primary) hypertension: Secondary | ICD-10-CM

## 2023-09-18 DIAGNOSIS — Z6841 Body Mass Index (BMI) 40.0 and over, adult: Secondary | ICD-10-CM

## 2023-09-18 DIAGNOSIS — Z79899 Other long term (current) drug therapy: Secondary | ICD-10-CM

## 2023-09-18 DIAGNOSIS — G4730 Sleep apnea, unspecified: Secondary | ICD-10-CM

## 2023-09-18 DIAGNOSIS — R7303 Prediabetes: Secondary | ICD-10-CM

## 2023-09-18 DIAGNOSIS — G43909 Migraine, unspecified, not intractable, without status migrainosus: Secondary | ICD-10-CM | POA: Diagnosis not present

## 2023-09-18 DIAGNOSIS — Z59819 Housing instability, housed unspecified: Secondary | ICD-10-CM

## 2023-09-18 DIAGNOSIS — J45909 Unspecified asthma, uncomplicated: Secondary | ICD-10-CM

## 2023-09-18 MED ORDER — METFORMIN HCL ER 500 MG PO TB24: 1000.0000 mg | ORAL_TABLET | Freq: Two times a day (BID) | ORAL | 1 refills | Status: AC

## 2023-09-18 NOTE — Progress Notes (Signed)
 Office: [phone]  /  Fax: [phone]  Weight Summary And Biometrics  Vitals Temp: 98.5 F (36.9 C) BP: 110/76 Pulse Rate: 98 SpO2: 99 %    Anthropometric Measurements Height: 5' 9 (1.753 m) Weight: (!) 367 lb (166.5 kg) BMI (Calculated): 54.17 Weight at Last Visit: 379 lb Weight Lost Since Last Visit: 12 lb Weight Gained Since Last Visit: 0 lb Starting Weight: 379 lb Total Weight Loss (lbs): 12 lb (5.443 kg)    Body Composition  Body Fat %: 51.4 % Fat Mass (lbs): 189 lbs Muscle Mass (lbs): 169.6 lbs Total Body Water (lbs): 122.4 lbs Visceral Fat Rating : 20     No data recorded Today's Visit #: 10   Starting Date: 06/20/22    Subjective   Chief Complaint: Obesity  Interval History Discussed the use of AI scribe software for clinical note transcription with the patient, who gave verbal consent to proceed.  History of Present Illness       Discussed the use of AI scribe software for clinical note transcription with the patient, who gave verbal consent to proceed.  History of Present Illness   Tracy Banks is a 43 year old female who presents for weight management and medication review.  She has achieved a weight loss of twelve pounds by eating more home-cooked meals, including rice, vegetables, fruits, and proteins, and reducing eating out. She is happy about this weight loss and is motivated to continue her efforts. She is working part-time at [employer], which involves being on her feet and climbing stairs, contributing to her physical activity.  She continues to experience knee pain, which she believes may improve with further weight loss. She is currently taking Vicodin and Xtampza  for pain management. She is not taking tramadol.  Her medication regimen includes metformin  for diabetes, torsemide, topiramate  (which she has run out of), Detrol for bladder issues, Symbicort for asthma, Crestor for cholesterol, Nurtec and Imitrex  for migraines,  propranolol , Lyrica , Prilosec, nortriptyline , hydroxychloroquine, Emgality , duloxetine , albuterol  as needed, vitamin D , and she has stopped taking sleep medication.  She has a history of anemia, with a hemoglobin level of 8.6 in November, and is not currently taking iron  supplements. She is scheduled for a sleep study in July.       Challenges affecting patient progress: multiple competing priorities, cost of healthy foods, having difficulty preparing healthy meals, having difficulty with meal prep and planning, having difficulty focusing on healthy eating, difficulty implementing reduced calorie nutrition plan, low volume of physical activity at present , medical comorbidities, presence of obesogenic drugs, inadequate sleep, moderate to high levels of stress, slow metabolism for age, and unstable housing.    Pharmacotherapy for weight management: She is currently taking Metformin  (off label use for incretin effect and / or insulin  resistance and / or diabetes prevention) without clinical response and without side effects. and insurance denied Wegovy  awaiting sleep study to see if she will qualify for Zepbound .   Assessment and Plan   Treatment Plan For Obesity:  Recommended Dietary Goals  Tracy Banks is currently in the action stage of change. As such, her goal is to continue weight management plan. She has agreed to: portion control, balanced plate and making smarter food choices, such as increasing vegetables, protein intake and reducing simple carbohydrates and processed foods  and continue to work on implementation of reduced calorie nutrition plan (RCNP)  Behavioral Health and Counseling  We discussed the following behavioral modification strategies today: increasing lean protein intake to established  goals, decreasing simple carbohydrates , increasing vegetables, increasing lower glycemic fruits, increasing fiber rich foods, avoiding skipping meals, increasing water intake , work on meal  planning and preparation, reading food labels , keeping healthy foods at home, and decreasing eating out or consumption of processed foods, and making healthy choices when eating convenient foods.  Additional education and resources provided today: None  Recommended Physical Activity Goals  Tracy Banks has been advised to work up to 150 minutes of moderate intensity aerobic activity a week and strengthening exercises 2-3 times per week for cardiovascular health, weight loss maintenance and preservation of muscle mass.   She has agreed to :  Continue to increase physical activity.  She has been doing more walking at work.  Pharmacotherapy  We discussed various medication options to help Tracy Banks with her weight loss efforts and we both agreed to : Continue metformin  insurance denied Wegovy  consider Zepbound  once sleep study gets completed in July  Associated Conditions Impacted by Obesity Treatment     Assessment and Plan    Obesity She has lost 12 pounds since the last visit, attributed to healthier eating habits and increased physical activity. The weight loss is expected to alleviate knee discomfort.   - Continue current dietary and exercise regimen - Encourage consistency in healthy lifestyle choices - Discuss potential for weight loss injections pending sleep study results  Knee Pain Persistent knee pressure likely related to weight. Explained that weight loss can significantly reduce knee load, potentially alleviating pain. - Encourage weight loss to reduce knee pressure  Prediabetes Currently taking metformin  with no reported issues. - Continue metformin  at the current dose  Hypertension Blood pressure is well-controlled at 110/76 mmHg with current medication regimen. - Continue current blood pressure medications  Chronic Pain Management On Xtampza  for baseline pain management and Vicodin as needed. Tramadol has been removed from her medication list. - Continue Xtampza  and  Vicodin as needed  Migraine Prescribed topiramate  for headache management and weight loss, plans to resume. Uses Imitrex  for acute migraines and Nurtec for prevention. - Resume topiramate  - Continue Imitrex  and Nurtec as prescribed  Asthma Managed with Symbicort and albuterol  as needed. - Continue Symbicort and albuterol  as needed  Anemia Hemoglobin level was 8.6 g/dL in November, indicating anemia. Advised follow-up with primary care to monitor blood counts and consider iron  supplementation if needed. - Follow up with primary care team for anemia management - Consider iron  supplementation or infusion if indicated  Sleep Apnea Scheduled for a sleep study in July to determine candidacy for GLP-1 treatment, considering obesity and related conditions. - Complete scheduled sleep study in July  General Health Maintenance Taking Crestor for cholesterol management and vitamin D  supplementation. Medication list reviewed and adjusted as necessary. - Continue Crestor and vitamin D  supplementation - Review and adjust medication list as needed  Follow-up Scheduled to return in four weeks for follow-up on medical weight loss and other health conditions. - Schedule follow-up appointment in four weeks         Objective   Physical Exam:  Blood pressure 110/76, pulse 98, temperature 98.5 F (36.9 C), height 5' 9 (1.753 m), weight (!) 367 lb (166.5 kg), last menstrual period 08/18/2023, SpO2 99%. Body mass index is 54.2 kg/m.  General: She is overweight, cooperative, alert, well developed, and in no acute distress. PSYCH: Has normal mood, affect and thought process.   HEENT: EOMI, sclerae are anicteric. Lungs: Normal breathing effort, no conversational dyspnea. Extremities: No edema.  Neurologic: No gross sensory  or motor deficits. No tremors or fasciculations noted.    Diagnostic Data Reviewed:  BMET    Component Value Date/Time   NA 140 01/12/2023 1301   NA 144 06/20/2022 1454    K 3.6 01/12/2023 1301   CL 104 01/12/2023 1301   CO2 27 01/12/2023 1301   GLUCOSE 88 01/12/2023 1301   BUN 11 01/12/2023 1301   BUN 10 06/20/2022 1454   CREATININE 1.08 (H) 01/12/2023 1301   CALCIUM 8.8 (L) 01/12/2023 1301   GFRNONAA >60 01/12/2023 1301   GFRAA 76 06/11/2019 1024   Lab Results  Component Value Date   HGBA1C 5.7 (H) 06/20/2022   HGBA1C 5.3 06/11/2019   Lab Results  Component Value Date   INSULIN  46.4 (H) 06/20/2022   Lab Results  Component Value Date   TSH 1.500 06/20/2022   CBC    Component Value Date/Time   WBC 7.8 02/11/2023 1247   WBC 7.4 01/12/2023 1301   RBC 4.06 02/11/2023 1247   RBC 4.03 02/11/2023 1246   HGB 8.6 (L) 02/11/2023 1247   HGB 11.4 06/20/2022 1454   HCT 29.3 (L) 02/11/2023 1247   HCT 37.4 06/20/2022 1454   PLT 211 02/11/2023 1247   PLT 279 06/20/2022 1454   MCV 72.2 (L) 02/11/2023 1247   MCV 74 (L) 06/20/2022 1454   MCH 21.2 (L) 02/11/2023 1247   MCHC 29.4 (L) 02/11/2023 1247   RDW 18.7 (H) 02/11/2023 1247   RDW 17.3 (H) 06/20/2022 1454   Iron  Studies    Component Value Date/Time   IRON  15 (L) 02/11/2023 1247   TIBC 458 (H) 02/11/2023 1247   FERRITIN 9 (L) 02/11/2023 1247   IRONPCTSAT 3 (L) 02/11/2023 1247   Lipid Panel     Component Value Date/Time   CHOL 98 (L) 06/20/2022 1454   TRIG 97 06/20/2022 1454   HDL 45 06/20/2022 1454   LDLCALC 34 06/20/2022 1454   Hepatic Function Panel     Component Value Date/Time   PROT 6.5 01/12/2023 1301   PROT 7.9 06/20/2022 1454   ALBUMIN 2.9 (L) 01/12/2023 1301   ALBUMIN 4.1 06/20/2022 1454   AST 31 01/12/2023 1301   ALT 20 01/12/2023 1301   ALKPHOS 75 01/12/2023 1301   BILITOT 0.3 01/12/2023 1301   BILITOT 0.3 06/20/2022 1454      Component Value Date/Time   TSH 1.500 06/20/2022 1454   Nutritional Lab Results  Component Value Date   VD25OH 29.1 (L) 06/20/2022   VD25OH 10.0 (L) 07/08/2019    Medications: Outpatient Encounter Medications as of 09/18/2023   Medication Sig Note   albuterol  (VENTOLIN  HFA) 108 (90 Base) MCG/ACT inhaler Inhale 2 puffs into the lungs every 4 (four) hours as needed for wheezing or shortness of breath.    Cholecalciferol (VITAMIN D3) 50 MCG (2000 UT) capsule Take 1 capsule (2,000 Units total) by mouth daily.    DULoxetine  (CYMBALTA ) 30 MG capsule Take 1 capsule (30 mg total) by mouth daily.    DULoxetine  (CYMBALTA ) 60 MG capsule TAKE 1 CAPSULE(60 MG) BY MOUTH DAILY    EMGALITY  120 MG/ML SOAJ Inject 120 mg into the skin every 30 (thirty) days.    folic acid  (FOLVITE ) 1 MG tablet Take 1 tablet (1 mg total) by mouth daily.    HYDROcodone -acetaminophen  (NORCO/VICODIN) 5-325 MG tablet Take 1-2 tablets by mouth every 6 (six) hours as needed.    nortriptyline  (PAMELOR ) 50 MG capsule Take 2 capsules (100 mg total) by mouth at bedtime.  omeprazole (PRILOSEC) 40 MG capsule Take 40 mg by mouth daily.    pregabalin  (LYRICA ) 150 MG capsule Take 1 capsule (150 mg total) by mouth in the morning, at noon, and at bedtime.    propranolol  (INDERAL ) 40 MG tablet Take 1 tablet (40 mg total) by mouth 2 (two) times daily.    Rimegepant Sulfate (NURTEC) 75 MG TBDP Take 1 tab at onset of migraine.  May repeat in 2 hrs, if needed.  Max dose: 2 tabs/day. This is a 30 day prescription.    rosuvastatin (CRESTOR) 10 MG tablet Take 10 mg by mouth daily.    SUMAtriptan  (IMITREX ) 100 MG tablet Take 1 tablet (100 mg total) by mouth every 2 (two) hours as needed for migraine. May repeat in 2 hours if headache persists or recurs.    SYMBICORT 80-4.5 MCG/ACT inhaler Inhale 2 puffs into the lungs.    tolterodine (DETROL LA) 4 MG 24 hr capsule Take 4 mg by mouth daily.    topiramate  (TOPAMAX ) 100 MG tablet One in am/ 2 at night    torsemide (DEMADEX) 20 MG tablet Take 20 mg by mouth daily.    XTAMPZA  ER 13.5 MG C12A Take 1 capsule by mouth 2 (two) times daily.    ziprasidone (GEODON) 20 MG capsule Take 20 mg by mouth See admin instructions. Take with 80 mg  for a total of 100 mg daily    ziprasidone (GEODON) 80 MG capsule Take 80 mg by mouth See admin instructions. Take with 20 mg for a total of 100 mg daily    zolpidem (AMBIEN) 5 MG tablet Take 1 tablet by mouth at bedtime as needed.    [DISCONTINUED] ibuprofen  (ADVIL ) 800 MG tablet Take 800 mg by mouth every 8 (eight) hours as needed.    [DISCONTINUED] metFORMIN  (GLUCOPHAGE -XR) 500 MG 24 hr tablet Take 2 tablets (1,000 mg total) by mouth 2 (two) times daily with a meal.    [DISCONTINUED] traMADol (ULTRAM) 50 MG tablet Take 50 mg by mouth daily. 01/18/2022: Not [facility]   hydroxychloroquine (PLAQUENIL) 200 MG tablet Take 200 mg by mouth 2 (two) times daily.    metFORMIN  (GLUCOPHAGE -XR) 500 MG 24 hr tablet Take 2 tablets (1,000 mg total) by mouth 2 (two) times daily with a meal.    [DISCONTINUED] nystatin (MYCOSTATIN) 100000 UNIT/ML suspension  (Patient not taking: Reported on 08/13/2023)    [DISCONTINUED] Sodium Sulfate-Mag Sulfate-KCl (SUTAB ) 1479-225-188 MG TABS Use as directed for colonoscopy. MANUFACTURER CODES!! BIN: [id number] PCN: CN GROUP: [id number] MEMBER ID: [id number];MLW AS SECONDARY INSURANCE ;NO PRIOR AUTHORIZATION (Patient not taking: Reported on 08/13/2023)    No facility-administered encounter medications on file as of 09/18/2023.     Follow-Up   Return in about 4 weeks (around 10/16/2023) for For Weight Mangement with Dr. Francyne.SABRA She was informed of the importance of frequent follow up visits to maximize her success with intensive lifestyle modifications for her multiple health conditions.  Attestation Statement   Reviewed by clinician on day of visit: allergies, medications, problem list, medical history, surgical history, family history, social history, and previous encounter notes.     Lucas Francyne, MD

## 2023-09-19 ENCOUNTER — Other Ambulatory Visit (INDEPENDENT_AMBULATORY_CARE_PROVIDER_SITE_OTHER): Payer: Self-pay | Admitting: Internal Medicine

## 2023-09-19 DIAGNOSIS — R7303 Prediabetes: Secondary | ICD-10-CM

## 2023-10-14 ENCOUNTER — Telehealth: Payer: Self-pay | Admitting: Neurology

## 2023-10-14 MED ORDER — TOPIRAMATE 100 MG PO TABS: ORAL_TABLET | ORAL | 4 refills | Status: AC

## 2023-10-14 NOTE — Telephone Encounter (Signed)
 Refill sent.

## 2023-10-14 NOTE — Telephone Encounter (Signed)
 Pt is needing a refill request for her topiramate  (TOPAMAX ) 100 MG tablet sent in to the [facility]

## 2023-10-21 ENCOUNTER — Ambulatory Visit (INDEPENDENT_AMBULATORY_CARE_PROVIDER_SITE_OTHER): Admitting: Internal Medicine

## 2023-10-21 ENCOUNTER — Encounter (INDEPENDENT_AMBULATORY_CARE_PROVIDER_SITE_OTHER): Payer: Self-pay

## 2024-04-06 ENCOUNTER — Other Ambulatory Visit: Payer: Self-pay | Admitting: Nurse Practitioner

## 2024-04-06 DIAGNOSIS — D500 Iron deficiency anemia secondary to blood loss (chronic): Secondary | ICD-10-CM

## 2024-04-06 NOTE — Progress Notes (Unsigned)
 "     [facility]   [phone] [phone]    Patient Care Team: Haze Kingfisher, MD as PCP - General (Family Medicine) Christine Rush, DPM as Consulting Physician (Podiatry) Degan Hanser K, NP as Nurse Practitioner (Hematology and Oncology)   CHIEF COMPLAINT: referred back for anemia, last seen 02/11/2023  CURRENT THERAPY: IV iron  PRN and folic acid    INTERVAL HISTORY Ms. Polasek returns for referral back, seen only once by heme 02/11/23 at which time I recommended continue oral iron , start folic acid , and complete IV venofer  with short interval f/up and labs. She received IV venofer  500 mg (with benadryl ) on 03/06/23 and 03/22/23. EGD/colonoscopy 08/06/23 by Dr. Suzann showed gastritis, no endoscopic source of bleedin.  More recently PCP labs 03/25/2024 showed serum iron  99, TIBC 347, 29% saturation, and a ferritin of 10.  CBC with a hemoglobin of 12.4, no other cytopenias. She is not taking oral iron  but is taking folic acid . Menstrual bleeding occurs monthly, heavy x3 days, denies other bleeding. Feels normal, without increased fatigue, dizziness or other new/specific complaints.   ROS  All other systems reviewed and negative   Past Medical History:  Diagnosis Date   Acid reflux    Anemia    Anxiety    Arthritis    Asthma    Back pain    Carpal tunnel syndrome    Depression    Eczema    Fibromyalgia    Gout    History of swelling of feet    Hyperlipidemia    Hypertension    Incontinence    Lupus    Migraine    Osteoarthritis    Overactive bladder    Pneumonia    Polyarthralgia    Vitamin D  deficiency      Past Surgical History:  Procedure Laterality Date   ANKLE SURGERY Bilateral    BIOPSY OF SKIN SUBCUTANEOUS TISSUE AND/OR MUCOUS MEMBRANE  08/06/2023   Procedure: BIOPSY, SKIN, SUBCUTANEOUS TISSUE, OR MUCOUS MEMBRANE;  Surgeon: Suzann Inocente HERO, MD;  Location: [facility];  Service: Gastroenterology;;   CHONDROPLASTY Right  01/04/2022   Procedure: KNEE ARTHROSCOPY CHONDROPLASTY;  Surgeon: Gerome Charleston, MD;  Location: [facility];  Service: Orthopedics;  Laterality: Right;  knee block  60   COLONOSCOPY WITH PROPOFOL  N/A 08/06/2023   Procedure: COLONOSCOPY WITH PROPOFOL ;  Surgeon: Suzann Inocente HERO, MD;  Location: [facility];  Service: Gastroenterology;  Laterality: N/A;   ESOPHAGOGASTRODUODENOSCOPY (EGD) WITH PROPOFOL  N/A 08/06/2023   Procedure: ESOPHAGOGASTRODUODENOSCOPY (EGD) WITH PROPOFOL ;  Surgeon: Suzann Inocente HERO, MD;  Location: [facility];  Service: Gastroenterology;  Laterality: N/A;   KNEE ARTHROSCOPY WITH LATERAL MENISECTOMY Right 01/04/2022   Procedure: KNEE ARTHROSCOPY WITH PARTIAL LATERAL MENISECTOMY;  Surgeon: Gerome Charleston, MD;  Location: [facility];  Service: Orthopedics;  Laterality: Right;  knee block 60   LEG SURGERY Right    Muscle stretched   NEUROMA SURGERY       Outpatient Encounter Medications as of 04/08/2024  Medication Sig   albuterol  (VENTOLIN  HFA) 108 (90 Base) MCG/ACT inhaler Inhale 2 puffs into the lungs every 4 (four) hours as needed for wheezing or shortness of breath.   Cholecalciferol (VITAMIN D3) 50 MCG (2000 UT) capsule Take 1 capsule (2,000 Units total) by mouth daily.   DULoxetine  (CYMBALTA ) 30 MG capsule Take 1 capsule (30 mg total) by mouth daily.   DULoxetine  (CYMBALTA ) 60 MG capsule TAKE 1 CAPSULE(60 MG) BY MOUTH DAILY   EMGALITY  120 MG/ML SOAJ Inject 120  mg into the skin every 30 (thirty) days.   folic acid  (FOLVITE ) 1 MG tablet Take 1 tablet (1 mg total) by mouth daily.   HYDROcodone -acetaminophen  (NORCO/VICODIN) 5-325 MG tablet Take 1-2 tablets by mouth every 6 (six) hours as needed.   hydroxychloroquine (PLAQUENIL) 200 MG tablet Take 200 mg by mouth 2 (two) times daily.   metFORMIN  (GLUCOPHAGE -XR) 500 MG 24 hr tablet Take 2 tablets (1,000 mg total) by mouth 2 (two) times daily with a meal.   nortriptyline  (PAMELOR ) 50 MG capsule Take 2 capsules (100 mg total) by mouth  at bedtime.   omeprazole (PRILOSEC) 40 MG capsule Take 40 mg by mouth daily.   pregabalin  (LYRICA ) 150 MG capsule Take 1 capsule (150 mg total) by mouth in the morning, at noon, and at bedtime.   propranolol  (INDERAL ) 40 MG tablet Take 1 tablet (40 mg total) by mouth 2 (two) times daily.   Rimegepant Sulfate (NURTEC) 75 MG TBDP Take 1 tab at onset of migraine.  May repeat in 2 hrs, if needed.  Max dose: 2 tabs/day. This is a 30 day prescription.   rosuvastatin (CRESTOR) 10 MG tablet Take 10 mg by mouth daily.   SUMAtriptan  (IMITREX ) 100 MG tablet Take 1 tablet (100 mg total) by mouth every 2 (two) hours as needed for migraine. May repeat in 2 hours if headache persists or recurs.   SYMBICORT 80-4.5 MCG/ACT inhaler Inhale 2 puffs into the lungs.   tolterodine (DETROL LA) 4 MG 24 hr capsule Take 4 mg by mouth daily.   topiramate  (TOPAMAX ) 100 MG tablet One in am/ 2 at night   torsemide (DEMADEX) 20 MG tablet Take 20 mg by mouth daily.   XTAMPZA  ER 13.5 MG C12A Take 1 capsule by mouth 2 (two) times daily.   ziprasidone (GEODON) 20 MG capsule Take 20 mg by mouth See admin instructions. Take with 80 mg for a total of 100 mg daily   ziprasidone (GEODON) 80 MG capsule Take 80 mg by mouth See admin instructions. Take with 20 mg for a total of 100 mg daily   zolpidem (AMBIEN) 5 MG tablet Take 1 tablet by mouth at bedtime as needed.   No facility-administered encounter medications on file as of 04/08/2024.     Today's Vitals   04/08/24 1023  BP: 119/83  Pulse: 100  Resp: 17  Temp: (!) 97.4 F (36.3 C)  SpO2: 100%  Weight: (!) 385 lb 8 oz (174.9 kg)  PainSc: 0-No pain   Body mass index is 56.93 kg/m.    PHYSICAL EXAM GENERAL:alert, no distress and comfortable SKIN: no rash  EYES: sclera clear LUNGS: clear with normal breathing effort HEART: regular rate & rhythm NEURO: alert & oriented x 3 with fluent speech, no focal motor/sensory deficits    CBC    Latest Ref Rng & Units 02/11/2023    12:47 PM 01/12/2023    1:01 PM 06/20/2022    2:54 PM  CBC  WBC 4.0 - 10.5 K/uL 7.8  7.4  7.0   Hemoglobin 12.0 - 15.0 g/dL 8.6  8.5  88.5   Hematocrit 36.0 - 46.0 % 29.3  27.4  37.4   Platelets 150 - 400 K/uL 211  248  279       CMP     Latest Ref Rng & Units 01/12/2023    1:01 PM 06/20/2022    2:54 PM 01/02/2022   10:27 AM  CMP  Glucose 70 - 99 mg/dL 88  93  96   BUN 6 - 20 mg/dL 11  10  13    Creatinine 0.44 - 1.00 mg/dL 8.91  8.94  8.74   Sodium 135 - 145 mmol/L 140  144  143   Potassium 3.5 - 5.1 mmol/L 3.6  4.1  4.3   Chloride 98 - 111 mmol/L 104  105  112   CO2 22 - 32 mmol/L 27  20  25    Calcium 8.9 - 10.3 mg/dL 8.8  9.7  8.4   Total Protein 6.5 - 8.1 g/dL 6.5  7.9    Total Bilirubin 0.3 - 1.2 mg/dL 0.3  0.3    Alkaline Phos 38 - 126 U/L 75  102    AST 15 - 41 U/L 31  29    ALT 0 - 44 U/L 20  26        ASSESSMENT & PLAN:43 yo premenopausal female with    Anemia, likely IDA 2/2 gyn blood loss -She had normal CBC in 2021 and prior, developed mild anemia Hgb 11 range in 2023, and presents with symptomatic acute anemia hgb 8.5 with fatigue and ice pica in 2024.  -Was on depo contraceptive until 2022. Developed heavy bleeding x3 days monthly after stopping -EGD showed gastritis; no bleeding on colonoscopy  -Further work up shows ferritin 9 and elevated TIBC c/w IDA (01/04/23) and low folate -She has multiple medical conditions and autoimmune condition which are controlled per patient.  She may have a component of anemia of chronic disease -S/p IV Venofer  500 mg x2 in 02/2023 - 03/2023 and began folic acid , she subsequently lost follow up with us  -Ms. Rohman was referred back by PCP for IDA, she continues to have menorrhagia, no other bleeding. Outside labs reviewed, showing normal Hgb and iron  studies except ferritin 10. She is asymptomatic.        PLAN: -Outside labs reviewed, cancel lab today -Stop folic acid  tablet and start prenatal vitamin (includes iron  and  folate), take separately from PPI -Lab and follow up in 3-4 months, or sooner if needed   All questions were answered. The patient knows to call the clinic with any problems, questions or concerns. No barriers to learning were detected.  Ammara Raj K Keyarah Mcroy, NP 04/08/2024  "

## 2024-04-08 ENCOUNTER — Encounter: Payer: Self-pay | Admitting: Nurse Practitioner

## 2024-04-08 ENCOUNTER — Inpatient Hospital Stay

## 2024-04-08 ENCOUNTER — Inpatient Hospital Stay: Attending: Nurse Practitioner | Admitting: Nurse Practitioner

## 2024-04-08 VITALS — BP 119/83 | HR 100 | Temp 97.4°F | Resp 17 | Wt 385.5 lb

## 2024-04-08 DIAGNOSIS — D649 Anemia, unspecified: Secondary | ICD-10-CM | POA: Insufficient documentation

## 2024-04-08 DIAGNOSIS — D500 Iron deficiency anemia secondary to blood loss (chronic): Secondary | ICD-10-CM

## 2024-04-08 DIAGNOSIS — N920 Excessive and frequent menstruation with regular cycle: Secondary | ICD-10-CM | POA: Insufficient documentation

## 2024-06-09 ENCOUNTER — Ambulatory Visit: Admitting: Neurology

## 2024-07-14 ENCOUNTER — Inpatient Hospital Stay

## 2024-07-14 ENCOUNTER — Inpatient Hospital Stay: Admitting: Nurse Practitioner
